# Patient Record
Sex: Male | Born: 1935 | Race: White | Hispanic: No | Marital: Married | Smoking: Never smoker
Health system: Southern US, Community
[De-identification: ages and names within clinical notes are randomized; demographics above are authoritative.]

## PROBLEM LIST (undated history)

## (undated) DIAGNOSIS — I209 Angina pectoris, unspecified: Secondary | ICD-10-CM

## (undated) DIAGNOSIS — K219 Gastro-esophageal reflux disease without esophagitis: Secondary | ICD-10-CM

## (undated) DIAGNOSIS — R569 Unspecified convulsions: Secondary | ICD-10-CM

## (undated) DIAGNOSIS — F0390 Unspecified dementia without behavioral disturbance: Secondary | ICD-10-CM

## (undated) DIAGNOSIS — IMO0002 Reserved for concepts with insufficient information to code with codable children: Secondary | ICD-10-CM

## (undated) DIAGNOSIS — I219 Acute myocardial infarction, unspecified: Secondary | ICD-10-CM

## (undated) DIAGNOSIS — I509 Heart failure, unspecified: Secondary | ICD-10-CM

## (undated) DIAGNOSIS — G309 Alzheimer's disease, unspecified: Secondary | ICD-10-CM

## (undated) DIAGNOSIS — I639 Cerebral infarction, unspecified: Secondary | ICD-10-CM

## (undated) DIAGNOSIS — K5792 Diverticulitis of intestine, part unspecified, without perforation or abscess without bleeding: Secondary | ICD-10-CM

## (undated) DIAGNOSIS — I10 Essential (primary) hypertension: Secondary | ICD-10-CM

## (undated) DIAGNOSIS — I4891 Unspecified atrial fibrillation: Secondary | ICD-10-CM

## (undated) DIAGNOSIS — I480 Paroxysmal atrial fibrillation: Secondary | ICD-10-CM

## (undated) DIAGNOSIS — F0280 Dementia in other diseases classified elsewhere without behavioral disturbance: Secondary | ICD-10-CM

## (undated) DIAGNOSIS — M1990 Unspecified osteoarthritis, unspecified site: Secondary | ICD-10-CM

## (undated) DIAGNOSIS — R4702 Dysphasia: Secondary | ICD-10-CM

## (undated) HISTORY — PX: JOINT REPLACEMENT: SHX530

## (undated) HISTORY — PX: TOTAL KNEE ARTHROPLASTY: SHX125

## (undated) HISTORY — PX: TOTAL HIP ARTHROPLASTY: SHX124

## (undated) HISTORY — PX: KNEE ARTHROSCOPY: SHX127

## (undated) HISTORY — PX: COLON SURGERY: SHX602

---

## 1978-09-21 HISTORY — PX: COLOSTOMY TAKEDOWN: SHX5258

## 1978-09-21 HISTORY — PX: COLON RESECTION: SHX5231

## 1997-05-29 ENCOUNTER — Other Ambulatory Visit: Admission: RE | Admit: 1997-05-29 | Discharge: 1997-05-29 | Payer: Self-pay | Admitting: Gastroenterology

## 1997-06-21 ENCOUNTER — Other Ambulatory Visit: Admission: RE | Admit: 1997-06-21 | Discharge: 1997-06-21 | Payer: Self-pay | Admitting: Gastroenterology

## 1997-09-12 ENCOUNTER — Encounter: Payer: Self-pay | Admitting: Cardiology

## 1997-09-12 ENCOUNTER — Ambulatory Visit (HOSPITAL_COMMUNITY): Admission: RE | Admit: 1997-09-12 | Discharge: 1997-09-12 | Payer: Self-pay | Admitting: Cardiology

## 2000-10-26 ENCOUNTER — Emergency Department (HOSPITAL_COMMUNITY): Admission: EM | Admit: 2000-10-26 | Discharge: 2000-10-27 | Payer: Self-pay | Admitting: *Deleted

## 2002-03-11 ENCOUNTER — Inpatient Hospital Stay (HOSPITAL_COMMUNITY): Admission: EM | Admit: 2002-03-11 | Discharge: 2002-03-15 | Payer: Self-pay | Admitting: Emergency Medicine

## 2002-03-11 ENCOUNTER — Encounter: Payer: Self-pay | Admitting: Orthopedic Surgery

## 2002-03-11 ENCOUNTER — Encounter: Payer: Self-pay | Admitting: Emergency Medicine

## 2002-03-15 ENCOUNTER — Encounter: Payer: Self-pay | Admitting: Physical Medicine & Rehabilitation

## 2002-03-15 ENCOUNTER — Inpatient Hospital Stay (HOSPITAL_COMMUNITY)
Admission: AD | Admit: 2002-03-15 | Discharge: 2002-03-25 | Payer: Self-pay | Admitting: Physical Medicine & Rehabilitation

## 2002-03-17 ENCOUNTER — Encounter: Payer: Self-pay | Admitting: Physical Medicine & Rehabilitation

## 2002-03-23 ENCOUNTER — Encounter: Payer: Self-pay | Admitting: Physical Medicine & Rehabilitation

## 2004-03-27 ENCOUNTER — Ambulatory Visit (HOSPITAL_COMMUNITY): Admission: RE | Admit: 2004-03-27 | Discharge: 2004-03-27 | Payer: Self-pay | Admitting: Gastroenterology

## 2004-03-27 ENCOUNTER — Encounter (INDEPENDENT_AMBULATORY_CARE_PROVIDER_SITE_OTHER): Payer: Self-pay | Admitting: Specialist

## 2005-12-01 ENCOUNTER — Encounter: Admission: RE | Admit: 2005-12-01 | Discharge: 2005-12-01 | Payer: Self-pay | Admitting: Neurology

## 2007-01-21 DIAGNOSIS — I219 Acute myocardial infarction, unspecified: Secondary | ICD-10-CM

## 2007-01-21 HISTORY — DX: Acute myocardial infarction, unspecified: I21.9

## 2007-01-21 HISTORY — PX: CORONARY ARTERY BYPASS GRAFT: SHX141

## 2007-02-23 ENCOUNTER — Inpatient Hospital Stay (HOSPITAL_COMMUNITY): Admission: EM | Admit: 2007-02-23 | Discharge: 2007-03-04 | Payer: Self-pay | Admitting: Emergency Medicine

## 2007-02-24 ENCOUNTER — Encounter: Payer: Self-pay | Admitting: Surgery

## 2007-02-24 ENCOUNTER — Encounter (INDEPENDENT_AMBULATORY_CARE_PROVIDER_SITE_OTHER): Payer: Self-pay | Admitting: Cardiology

## 2007-02-24 ENCOUNTER — Ambulatory Visit: Payer: Self-pay | Admitting: Surgery

## 2007-02-26 ENCOUNTER — Encounter: Payer: Self-pay | Admitting: Surgery

## 2007-03-18 ENCOUNTER — Encounter: Admission: RE | Admit: 2007-03-18 | Discharge: 2007-03-18 | Payer: Self-pay | Admitting: Cardiology

## 2007-03-23 ENCOUNTER — Ambulatory Visit: Payer: Self-pay | Admitting: Surgery

## 2007-03-23 ENCOUNTER — Encounter: Admission: RE | Admit: 2007-03-23 | Discharge: 2007-03-23 | Payer: Self-pay | Admitting: Surgery

## 2007-03-25 ENCOUNTER — Encounter (HOSPITAL_COMMUNITY): Admission: RE | Admit: 2007-03-25 | Discharge: 2007-06-23 | Payer: Self-pay | Admitting: Cardiology

## 2007-05-18 ENCOUNTER — Encounter: Admission: RE | Admit: 2007-05-18 | Discharge: 2007-05-18 | Payer: Self-pay | Admitting: Internal Medicine

## 2007-06-24 ENCOUNTER — Encounter (HOSPITAL_COMMUNITY): Admission: RE | Admit: 2007-06-24 | Discharge: 2007-07-21 | Payer: Self-pay | Admitting: Cardiology

## 2007-11-09 ENCOUNTER — Encounter: Admission: RE | Admit: 2007-11-09 | Discharge: 2007-11-09 | Payer: Self-pay | Admitting: Internal Medicine

## 2007-12-14 ENCOUNTER — Encounter: Admission: RE | Admit: 2007-12-14 | Discharge: 2008-01-12 | Payer: Self-pay | Admitting: Internal Medicine

## 2009-01-20 DIAGNOSIS — I639 Cerebral infarction, unspecified: Secondary | ICD-10-CM

## 2009-01-20 HISTORY — DX: Cerebral infarction, unspecified: I63.9

## 2009-02-05 ENCOUNTER — Inpatient Hospital Stay (HOSPITAL_COMMUNITY): Admission: AD | Admit: 2009-02-05 | Discharge: 2009-02-09 | Payer: Self-pay | Admitting: Internal Medicine

## 2009-02-05 ENCOUNTER — Ambulatory Visit: Payer: Self-pay | Admitting: Diagnostic Radiology

## 2009-02-05 ENCOUNTER — Encounter: Payer: Self-pay | Admitting: Emergency Medicine

## 2009-02-06 ENCOUNTER — Ambulatory Visit: Payer: Self-pay | Admitting: Vascular Surgery

## 2009-02-06 ENCOUNTER — Encounter (INDEPENDENT_AMBULATORY_CARE_PROVIDER_SITE_OTHER): Payer: Self-pay | Admitting: Internal Medicine

## 2009-04-27 ENCOUNTER — Inpatient Hospital Stay (HOSPITAL_COMMUNITY): Admission: EM | Admit: 2009-04-27 | Discharge: 2009-05-10 | Payer: Self-pay | Admitting: Emergency Medicine

## 2009-04-28 ENCOUNTER — Encounter (INDEPENDENT_AMBULATORY_CARE_PROVIDER_SITE_OTHER): Payer: Self-pay | Admitting: Internal Medicine

## 2009-04-30 ENCOUNTER — Encounter (INDEPENDENT_AMBULATORY_CARE_PROVIDER_SITE_OTHER): Payer: Self-pay | Admitting: Internal Medicine

## 2009-05-06 ENCOUNTER — Ambulatory Visit: Payer: Self-pay | Admitting: Vascular Surgery

## 2009-05-06 ENCOUNTER — Encounter (INDEPENDENT_AMBULATORY_CARE_PROVIDER_SITE_OTHER): Payer: Self-pay | Admitting: Internal Medicine

## 2009-09-24 IMAGING — CR DG CHEST 2V
2 series · 2 of 2 positions shown · non-contrast
Comparison: Single view chest of 02/24/07.

CLINICAL DATA: CHF.
 CHEST ? 2 VIEW:

[w chest pa *]
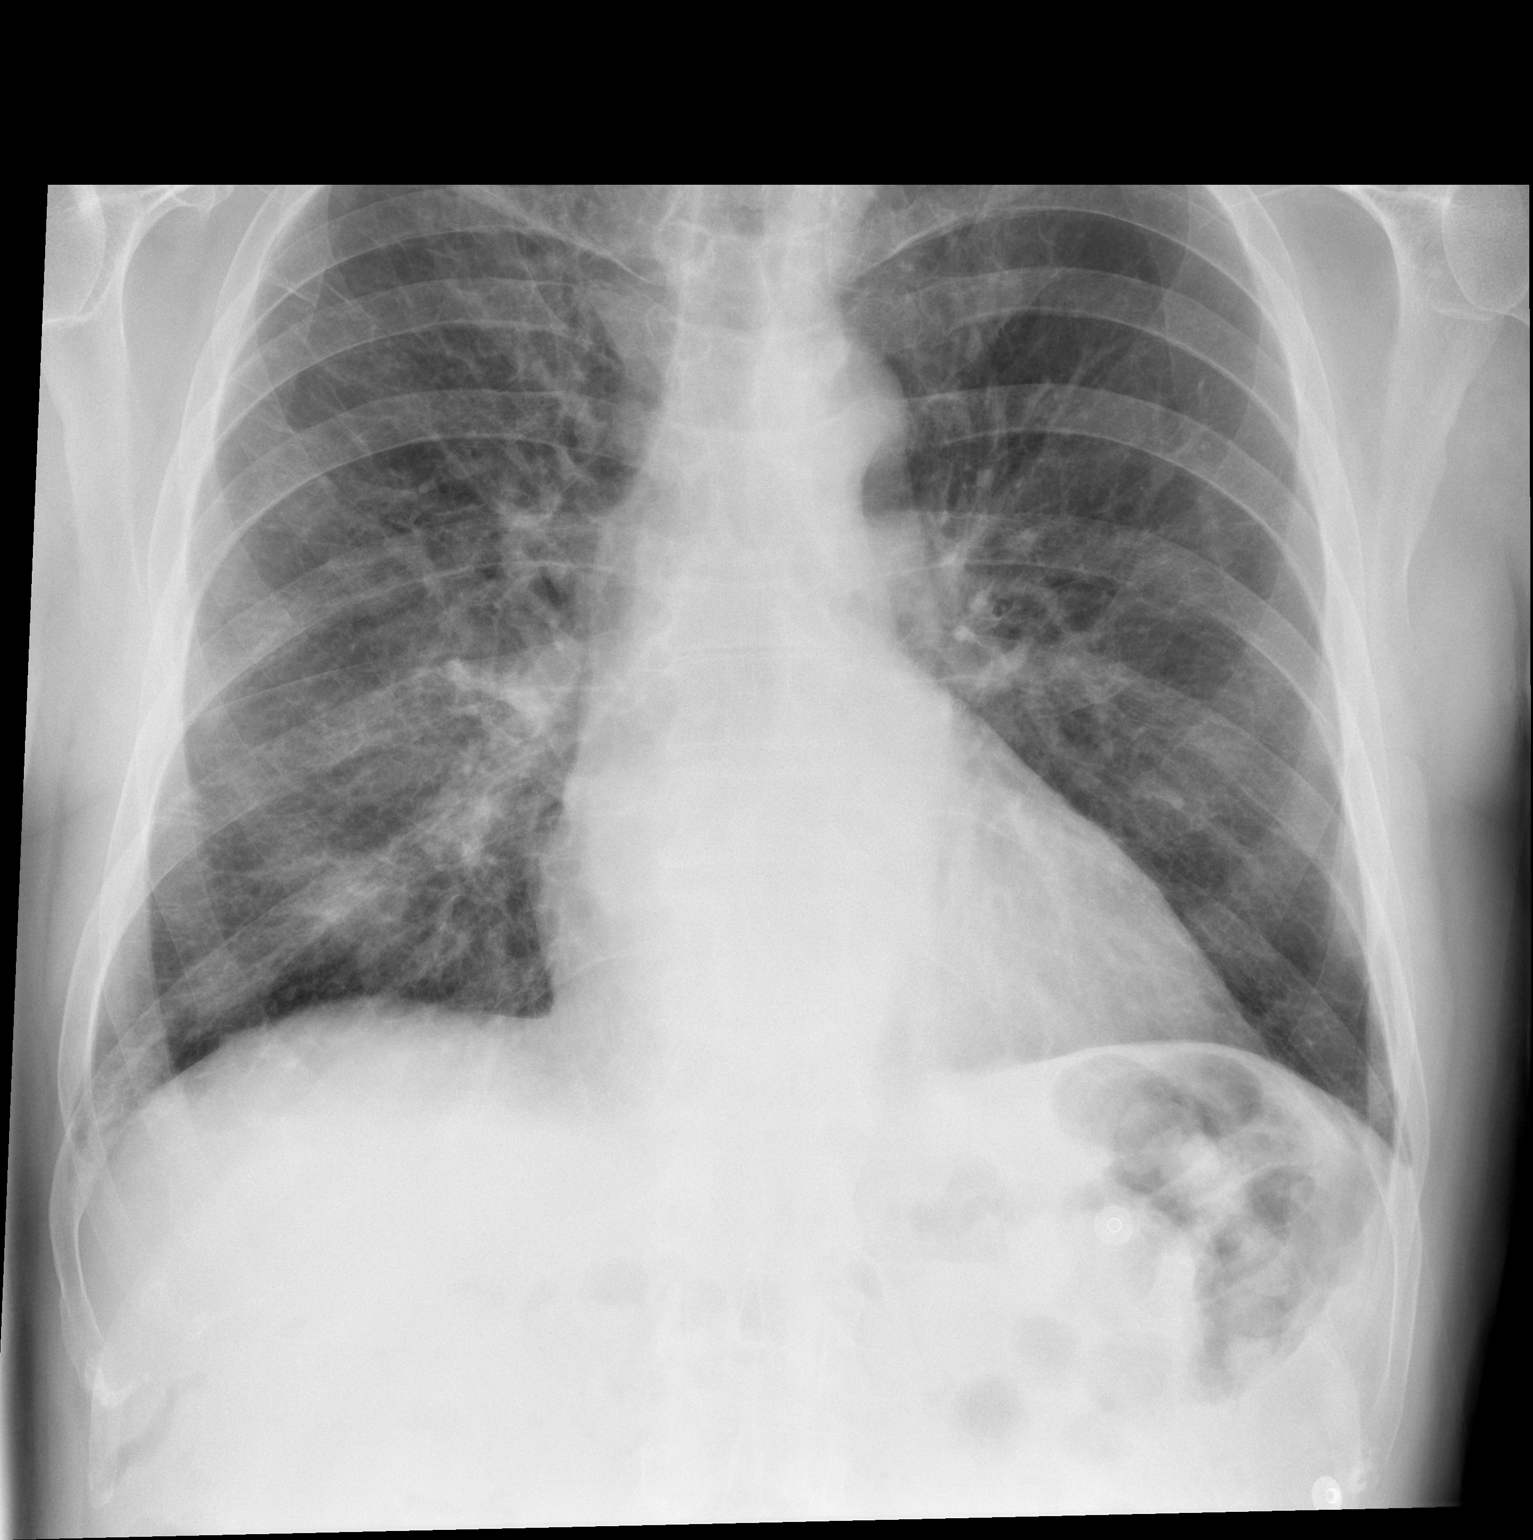

[w chest lat]
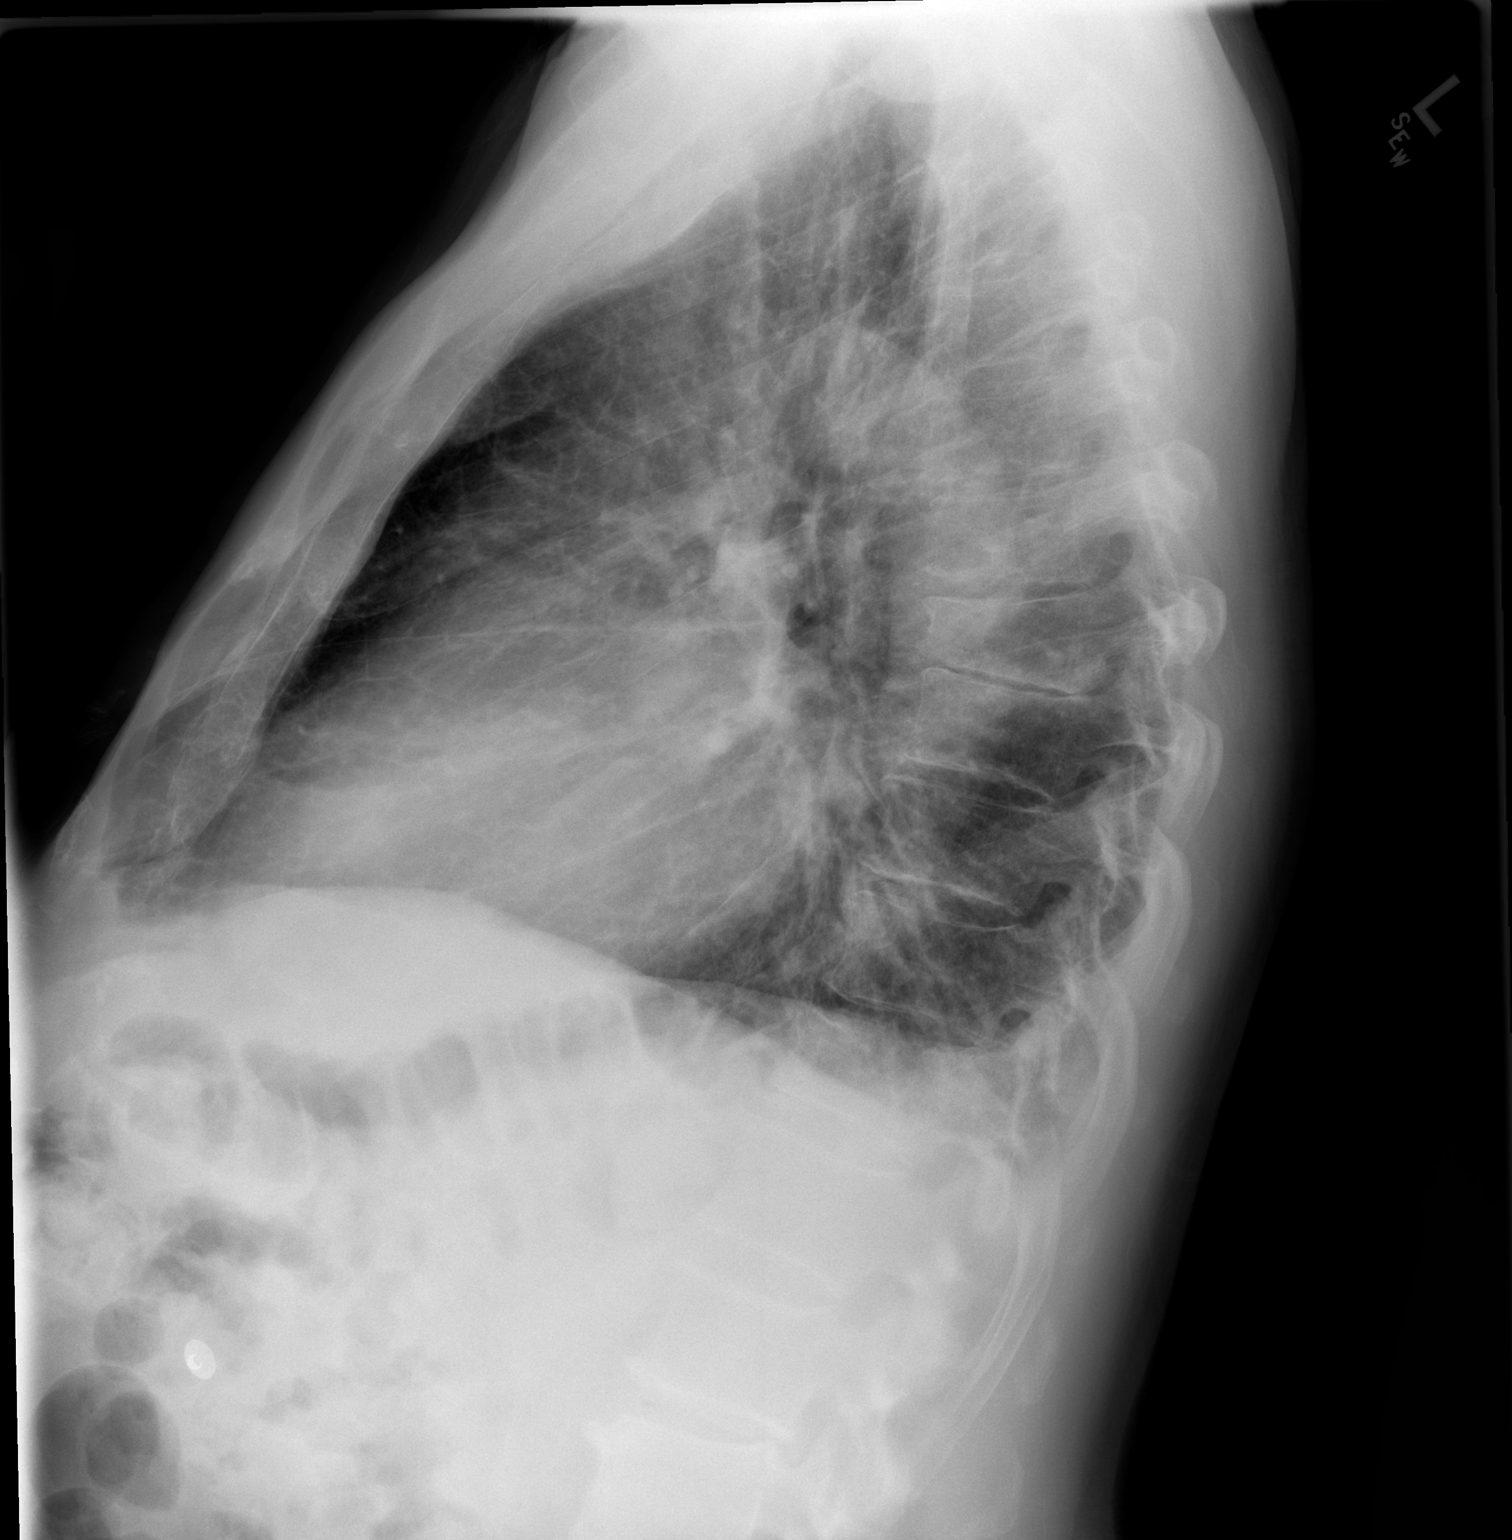

[2 of 2 positions shown; findings below may reference images not displayed]

FINDINGS: Stable mildly enlarged cardiac silhouette.  There are small bilateral pleural effusions.  Bilateral lower lobe airspace disease is not significantly changed from prior but improved from two days prior.  Mild interstitial edema is noted.  There is an age indeterminate compression fracture in the T-8 vertebral body.
IMPRESSION: 1.  Stable bilateral airspace disease. 
 2.  Slight increase in interstitial edema. 
 3.  Bilateral pleural effusions.

## 2009-09-26 IMAGING — CR DG CHEST 1V PORT
1 series · 1 of 1 positions shown · non-contrast
Comparison: 02/26/2007

CLINICAL DATA: Atelectasis. Status post CABG. 
 PORTABLE CHEST 02/27/2007:

[view not recorded]
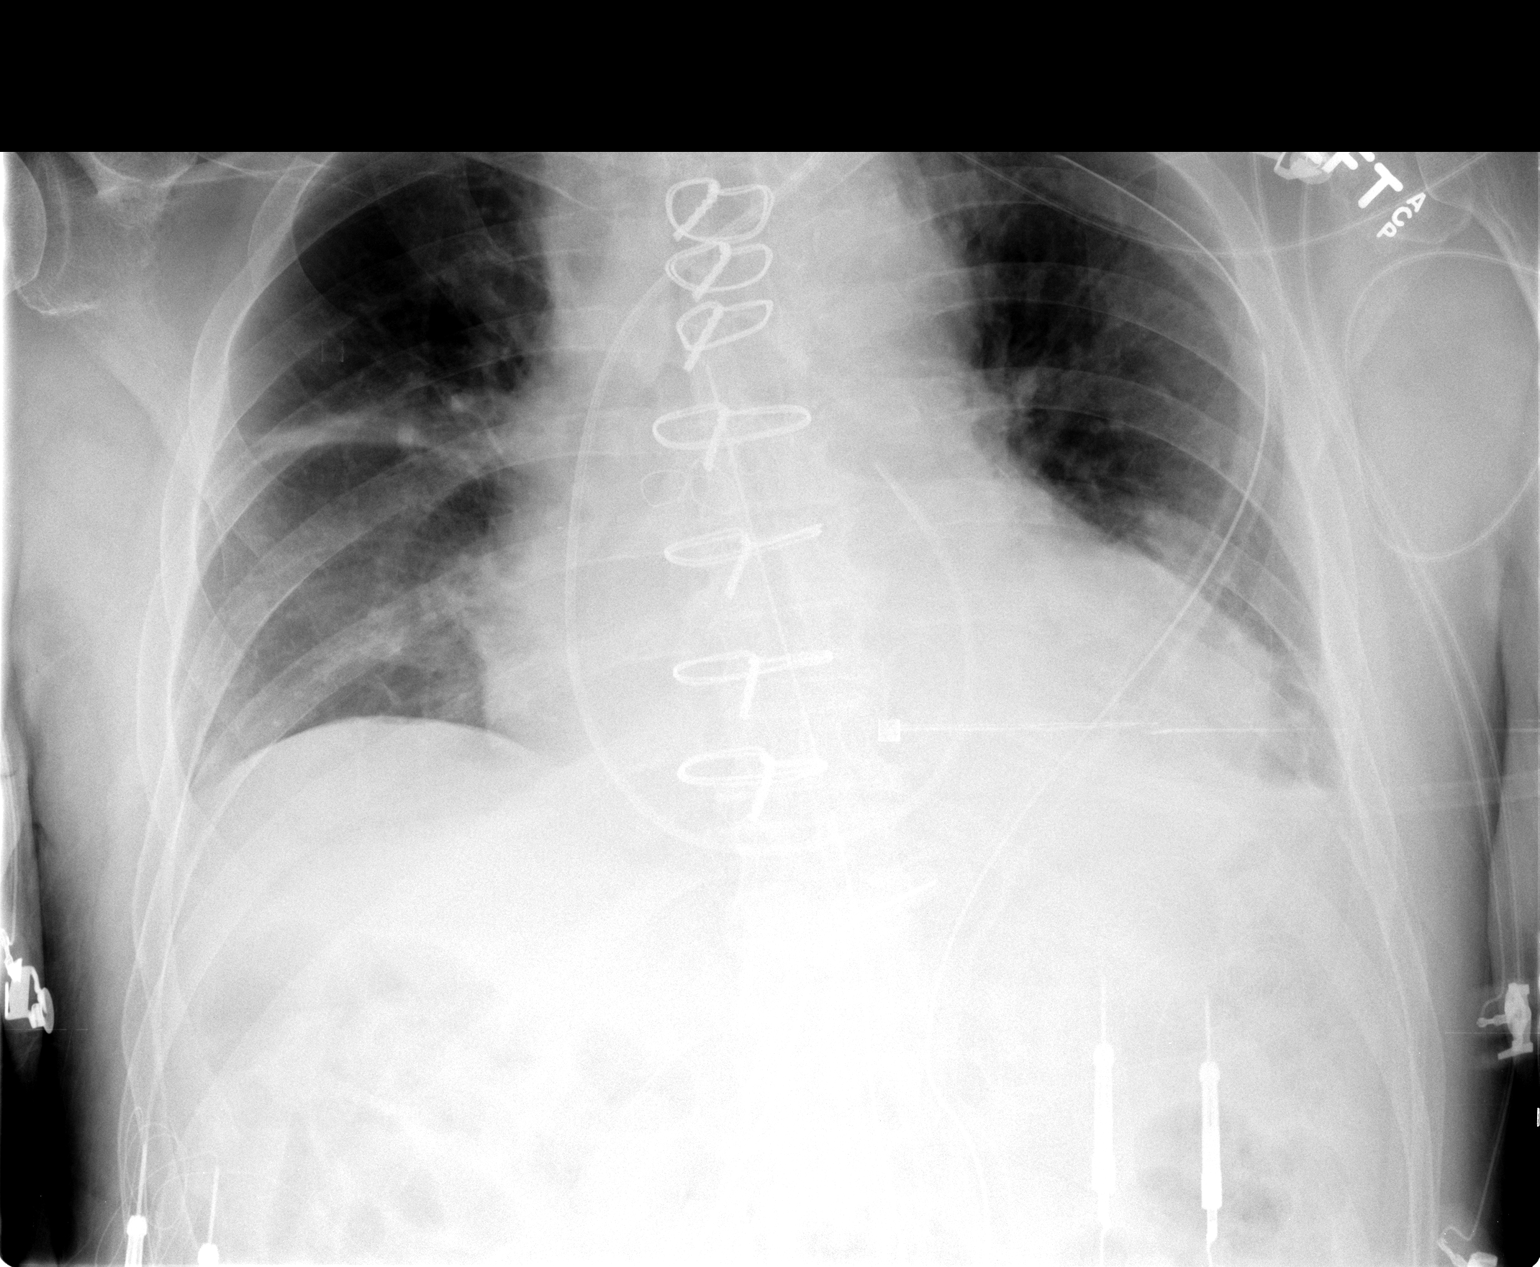

[1 of 1 positions shown; findings below may reference images not displayed]

FINDINGS: Endotracheal tube has been removed.  Swan-Ganz catheter has been repositioned and the tip is now in the main pulmonary artery. Left-sided chest tube and mediastinal tubes remain.  No pneumothorax.  The atelectasis has improved bilaterally.
IMPRESSION: Improving atelectasis.

## 2010-02-13 ENCOUNTER — Other Ambulatory Visit: Payer: Self-pay | Admitting: Dermatology

## 2010-04-07 LAB — CARDIAC PANEL(CRET KIN+CKTOT+MB+TROPI)
CK, MB: 1.8 ng/mL (ref 0.3–4.0)
CK, MB: 3 ng/mL (ref 0.3–4.0)
Relative Index: 0.9 (ref 0.0–2.5)
Relative Index: 1.5 (ref 0.0–2.5)
Troponin I: 0.05 ng/mL (ref 0.00–0.06)

## 2010-04-07 LAB — CBC
Hemoglobin: 12 g/dL — ABNORMAL LOW (ref 13.0–17.0)
MCHC: 33.6 g/dL (ref 30.0–36.0)
Platelets: 205 10*3/uL (ref 150–400)
RBC: 3.72 MIL/uL — ABNORMAL LOW (ref 4.22–5.81)
RBC: 3.84 MIL/uL — ABNORMAL LOW (ref 4.22–5.81)
WBC: 13.6 10*3/uL — ABNORMAL HIGH (ref 4.0–10.5)
WBC: 32.5 10*3/uL — ABNORMAL HIGH (ref 4.0–10.5)

## 2010-04-07 LAB — DIFFERENTIAL
Basophils Relative: 0 % (ref 0–1)
Eosinophils Relative: 0 % (ref 0–5)
Lymphocytes Relative: 4 % — ABNORMAL LOW (ref 12–46)
Monocytes Relative: 3 % (ref 3–12)
Neutro Abs: 30.2 10*3/uL — ABNORMAL HIGH (ref 1.7–7.7)

## 2010-04-07 LAB — VITAMIN B12: Vitamin B-12: 561 pg/mL (ref 211–911)

## 2010-04-07 LAB — BASIC METABOLIC PANEL
CO2: 24 mEq/L (ref 19–32)
Calcium: 7.6 mg/dL — ABNORMAL LOW (ref 8.4–10.5)
Glucose, Bld: 121 mg/dL — ABNORMAL HIGH (ref 70–99)
Sodium: 134 mEq/L — ABNORMAL LOW (ref 135–145)

## 2010-04-07 LAB — MAGNESIUM: Magnesium: 1.6 mg/dL (ref 1.5–2.5)

## 2010-04-07 LAB — RPR: RPR Ser Ql: NONREACTIVE

## 2010-04-07 LAB — PROTIME-INR
INR: 2.69 — ABNORMAL HIGH (ref 0.00–1.49)
INR: 2.83 — ABNORMAL HIGH (ref 0.00–1.49)
Prothrombin Time: 25 seconds — ABNORMAL HIGH (ref 11.6–15.2)
Prothrombin Time: 28.4 seconds — ABNORMAL HIGH (ref 11.6–15.2)

## 2010-04-07 LAB — CULTURE, BLOOD (ROUTINE X 2): Culture: NO GROWTH

## 2010-04-07 LAB — LIPID PANEL
HDL: 40 mg/dL (ref >39)
Total CHOL/HDL Ratio: 2.9 RATIO
Triglycerides: 75 mg/dL (ref <150)
VLDL: 15 mg/dL (ref 0–40)

## 2010-04-08 LAB — URINE MICROSCOPIC-ADD ON

## 2010-04-08 LAB — COMPREHENSIVE METABOLIC PANEL
ALT: 22 U/L (ref 0–53)
Alkaline Phosphatase: 97 U/L (ref 39–117)
BUN: 18 mg/dL (ref 6–23)
CO2: 29 mEq/L (ref 19–32)
Chloride: 103 mEq/L (ref 96–112)
GFR calc non Af Amer: >60 mL/min (ref >60)
Glucose, Bld: 119 mg/dL — ABNORMAL HIGH (ref 70–99)
Potassium: 4.2 mEq/L (ref 3.5–5.1)
Sodium: 140 mEq/L (ref 135–145)
Total Bilirubin: 1.2 mg/dL (ref 0.3–1.2)
Total Protein: 6.6 g/dL (ref 6.0–8.3)

## 2010-04-08 LAB — URINALYSIS, ROUTINE W REFLEX MICROSCOPIC
Glucose, UA: NEGATIVE mg/dL
Ketones, ur: NEGATIVE mg/dL
Protein, ur: >300 mg/dL — AB

## 2010-04-08 LAB — DIFFERENTIAL
Basophils Absolute: 0 10*3/uL (ref 0.0–0.1)
Basophils Relative: 0 % (ref 0–1)
Eosinophils Absolute: 0.3 10*3/uL (ref 0.0–0.7)
Lymphocytes Relative: 11 % — ABNORMAL LOW (ref 12–46)
Lymphs Abs: 3.3 10*3/uL (ref 0.7–4.0)
Monocytes Absolute: 1.5 10*3/uL — ABNORMAL HIGH (ref 0.1–1.0)
Neutrophils Relative %: 78 % — ABNORMAL HIGH (ref 43–77)

## 2010-04-08 LAB — CBC
HCT: 39.5 % (ref 39.0–52.0)
Hemoglobin: 13.5 g/dL (ref 13.0–17.0)
RBC: 4.27 MIL/uL (ref 4.22–5.81)
RDW: 12.5 % (ref 11.5–15.5)

## 2010-04-08 LAB — PROTIME-INR
INR: 3 — ABNORMAL HIGH (ref 0.00–1.49)
Prothrombin Time: 30.9 seconds — ABNORMAL HIGH (ref 11.6–15.2)

## 2010-04-08 LAB — CULTURE, BLOOD (ROUTINE X 2): Culture: NO GROWTH

## 2010-04-09 LAB — BASIC METABOLIC PANEL
BUN: 15 mg/dL (ref 6–23)
BUN: 16 mg/dL (ref 6–23)
BUN: 17 mg/dL (ref 6–23)
CO2: 23 mEq/L (ref 19–32)
CO2: 25 mEq/L (ref 19–32)
CO2: 28 mEq/L (ref 19–32)
Calcium: 7.8 mg/dL — ABNORMAL LOW (ref 8.4–10.5)
Calcium: 8.1 mg/dL — ABNORMAL LOW (ref 8.4–10.5)
Calcium: 8.3 mg/dL — ABNORMAL LOW (ref 8.4–10.5)
Chloride: 109 mEq/L (ref 96–112)
Chloride: 109 mEq/L (ref 96–112)
Chloride: 109 mEq/L (ref 96–112)
Creatinine, Ser: 0.78 mg/dL (ref 0.4–1.5)
Creatinine, Ser: 0.86 mg/dL (ref 0.4–1.5)
Creatinine, Ser: 0.92 mg/dL (ref 0.4–1.5)
GFR calc Af Amer: >60 mL/min (ref >60)
GFR calc Af Amer: >60 mL/min (ref >60)
GFR calc Af Amer: >60 mL/min (ref >60)
GFR calc non Af Amer: >60 mL/min (ref >60)
GFR calc non Af Amer: >60 mL/min (ref >60)
GFR calc non Af Amer: >60 mL/min (ref >60)
Glucose, Bld: 102 mg/dL — ABNORMAL HIGH (ref 70–99)
Glucose, Bld: 103 mg/dL — ABNORMAL HIGH (ref 70–99)
Glucose, Bld: 96 mg/dL (ref 70–99)
Potassium: 3.1 mEq/L — ABNORMAL LOW (ref 3.5–5.1)
Potassium: 3.8 mEq/L (ref 3.5–5.1)
Potassium: 3.9 mEq/L (ref 3.5–5.1)
Sodium: 140 mEq/L (ref 135–145)
Sodium: 143 mEq/L (ref 135–145)
Sodium: 144 mEq/L (ref 135–145)

## 2010-04-09 LAB — CARDIAC PANEL(CRET KIN+CKTOT+MB+TROPI)
CK, MB: 1.3 ng/mL (ref 0.3–4.0)
CK, MB: 1.8 ng/mL (ref 0.3–4.0)
Relative Index: INVALID (ref 0.0–2.5)
Total CK: 24 U/L (ref 7–232)
Total CK: 32 U/L (ref 7–232)
Troponin I: 0.02 ng/mL (ref 0.00–0.06)

## 2010-04-09 LAB — CBC
HCT: 37.5 % — ABNORMAL LOW (ref 39.0–52.0)
Hemoglobin: 12.7 g/dL — ABNORMAL LOW (ref 13.0–17.0)
MCHC: 34 g/dL (ref 30.0–36.0)
MCV: 93.3 fL (ref 78.0–100.0)
Platelets: 313 10*3/uL (ref 150–400)
RBC: 4.02 MIL/uL — ABNORMAL LOW (ref 4.22–5.81)
RDW: 13.4 % (ref 11.5–15.5)
WBC: 11 10*3/uL — ABNORMAL HIGH (ref 4.0–10.5)

## 2010-04-09 LAB — PROTIME-INR
INR: 2.55 — ABNORMAL HIGH (ref 0.00–1.49)
INR: 2.62 — ABNORMAL HIGH (ref 0.00–1.49)
INR: 2.62 — ABNORMAL HIGH (ref 0.00–1.49)
INR: 2.75 — ABNORMAL HIGH (ref 0.00–1.49)
INR: 2.78 — ABNORMAL HIGH (ref 0.00–1.49)
INR: 2.9 — ABNORMAL HIGH (ref 0.00–1.49)
Prothrombin Time: 27.2 seconds — ABNORMAL HIGH (ref 11.6–15.2)
Prothrombin Time: 27.8 seconds — ABNORMAL HIGH (ref 11.6–15.2)
Prothrombin Time: 27.8 seconds — ABNORMAL HIGH (ref 11.6–15.2)
Prothrombin Time: 28.9 seconds — ABNORMAL HIGH (ref 11.6–15.2)
Prothrombin Time: 29.1 seconds — ABNORMAL HIGH (ref 11.6–15.2)
Prothrombin Time: 30.1 seconds — ABNORMAL HIGH (ref 11.6–15.2)

## 2010-04-09 LAB — GLUCOSE, CAPILLARY: Glucose-Capillary: 131 mg/dL — ABNORMAL HIGH (ref 70–99)

## 2010-04-09 LAB — MRSA PCR SCREENING: MRSA by PCR: NEGATIVE

## 2010-04-10 LAB — CARDIAC PANEL(CRET KIN+CKTOT+MB+TROPI)
CK, MB: 0.8 ng/mL (ref 0.3–4.0)
CK, MB: 1.2 ng/mL (ref 0.3–4.0)
CK, MB: 5.4 ng/mL — ABNORMAL HIGH (ref 0.3–4.0)
Relative Index: 1.5 (ref 0.0–2.5)
Relative Index: 2 (ref 0.0–2.5)
Relative Index: INVALID (ref 0.0–2.5)
Relative Index: INVALID (ref 0.0–2.5)
Total CK: 55 U/L (ref 7–232)
Total CK: 74 U/L (ref 7–232)
Troponin I: 1.05 ng/mL (ref 0.00–0.06)
Troponin I: 1.2 ng/mL (ref 0.00–0.06)
Troponin I: 2.09 ng/mL (ref 0.00–0.06)
Troponin I: 2.49 ng/mL (ref 0.00–0.06)

## 2010-04-10 LAB — BASIC METABOLIC PANEL
BUN: 13 mg/dL (ref 6–23)
BUN: 17 mg/dL (ref 6–23)
BUN: 17 mg/dL (ref 6–23)
CO2: 26 mEq/L (ref 19–32)
CO2: 27 mEq/L (ref 19–32)
CO2: 29 mEq/L (ref 19–32)
Calcium: 8 mg/dL — ABNORMAL LOW (ref 8.4–10.5)
Calcium: 8.2 mg/dL — ABNORMAL LOW (ref 8.4–10.5)
Calcium: 8.4 mg/dL (ref 8.4–10.5)
Chloride: 104 mEq/L (ref 96–112)
Chloride: 107 mEq/L (ref 96–112)
Chloride: 108 mEq/L (ref 96–112)
Creatinine, Ser: 0.88 mg/dL (ref 0.4–1.5)
Creatinine, Ser: 0.94 mg/dL (ref 0.4–1.5)
Creatinine, Ser: 0.98 mg/dL (ref 0.4–1.5)
GFR calc Af Amer: >60 mL/min (ref >60)
GFR calc Af Amer: >60 mL/min (ref >60)
GFR calc Af Amer: >60 mL/min (ref >60)
GFR calc non Af Amer: >60 mL/min (ref >60)
GFR calc non Af Amer: >60 mL/min (ref >60)
GFR calc non Af Amer: >60 mL/min (ref >60)
Glucose, Bld: 105 mg/dL — ABNORMAL HIGH (ref 70–99)
Glucose, Bld: 109 mg/dL — ABNORMAL HIGH (ref 70–99)
Glucose, Bld: 111 mg/dL — ABNORMAL HIGH (ref 70–99)
Potassium: 3.3 mEq/L — ABNORMAL LOW (ref 3.5–5.1)
Potassium: 3.4 mEq/L — ABNORMAL LOW (ref 3.5–5.1)
Potassium: 3.5 mEq/L (ref 3.5–5.1)
Sodium: 138 mEq/L (ref 135–145)
Sodium: 142 mEq/L (ref 135–145)
Sodium: 143 mEq/L (ref 135–145)

## 2010-04-10 LAB — POCT I-STAT, CHEM 8
Calcium, Ion: 1.06 mmol/L — ABNORMAL LOW (ref 1.12–1.32)
Creatinine, Ser: 0.9 mg/dL (ref 0.4–1.5)
Glucose, Bld: 129 mg/dL — ABNORMAL HIGH (ref 70–99)
Hemoglobin: 12.6 g/dL — ABNORMAL LOW (ref 13.0–17.0)
Sodium: 143 mEq/L (ref 135–145)
TCO2: 25 mmol/L (ref 0–100)

## 2010-04-10 LAB — DIFFERENTIAL
Eosinophils Absolute: 0 10*3/uL (ref 0.0–0.7)
Lymphs Abs: 0.9 10*3/uL (ref 0.7–4.0)
Neutrophils Relative %: 87 % — ABNORMAL HIGH (ref 43–77)

## 2010-04-10 LAB — CBC
HCT: 35.5 % — ABNORMAL LOW (ref 39.0–52.0)
HCT: 35.8 % — ABNORMAL LOW (ref 39.0–52.0)
Hemoglobin: 12 g/dL — ABNORMAL LOW (ref 13.0–17.0)
Hemoglobin: 12 g/dL — ABNORMAL LOW (ref 13.0–17.0)
MCHC: 33.8 g/dL (ref 30.0–36.0)
MCV: 93.4 fL (ref 78.0–100.0)
MCV: 94.4 fL (ref 78.0–100.0)
MCV: 94.9 fL (ref 78.0–100.0)
Platelets: 184 10*3/uL (ref 150–400)
Platelets: 240 10*3/uL (ref 150–400)
RBC: 3.76 MIL/uL — ABNORMAL LOW (ref 4.22–5.81)
RBC: 3.77 MIL/uL — ABNORMAL LOW (ref 4.22–5.81)
RDW: 13.6 % (ref 11.5–15.5)
WBC: 11.9 10*3/uL — ABNORMAL HIGH (ref 4.0–10.5)
WBC: 13.1 10*3/uL — ABNORMAL HIGH (ref 4.0–10.5)
WBC: 14.7 10*3/uL — ABNORMAL HIGH (ref 4.0–10.5)

## 2010-04-10 LAB — PROTIME-INR
INR: 1.2 (ref 0.00–1.49)
INR: 1.31 (ref 0.00–1.49)
INR: 1.69 — ABNORMAL HIGH (ref 0.00–1.49)
INR: 2.04 — ABNORMAL HIGH (ref 0.00–1.49)
INR: 2.49 — ABNORMAL HIGH (ref 0.00–1.49)
Prothrombin Time: 15.1 seconds (ref 11.6–15.2)
Prothrombin Time: 16.2 seconds — ABNORMAL HIGH (ref 11.6–15.2)
Prothrombin Time: 19.7 seconds — ABNORMAL HIGH (ref 11.6–15.2)
Prothrombin Time: 22.9 seconds — ABNORMAL HIGH (ref 11.6–15.2)
Prothrombin Time: 26.7 seconds — ABNORMAL HIGH (ref 11.6–15.2)

## 2010-04-10 LAB — COMPREHENSIVE METABOLIC PANEL
ALT: 17 U/L (ref 0–53)
Albumin: 2.3 g/dL — ABNORMAL LOW (ref 3.5–5.2)
Alkaline Phosphatase: 59 U/L (ref 39–117)
BUN: 10 mg/dL (ref 6–23)
CO2: 29 mEq/L (ref 19–32)
Calcium: 8.3 mg/dL — ABNORMAL LOW (ref 8.4–10.5)
Chloride: 107 mEq/L (ref 96–112)
Creatinine, Ser: 0.95 mg/dL (ref 0.4–1.5)
Creatinine, Ser: 0.99 mg/dL (ref 0.4–1.5)
GFR calc Af Amer: >60 mL/min (ref >60)
GFR calc non Af Amer: >60 mL/min (ref >60)
Glucose, Bld: 149 mg/dL — ABNORMAL HIGH (ref 70–99)
Glucose, Bld: 150 mg/dL — ABNORMAL HIGH (ref 70–99)
Potassium: 3.1 mEq/L — ABNORMAL LOW (ref 3.5–5.1)
Sodium: 142 mEq/L (ref 135–145)
Total Bilirubin: 1.3 mg/dL — ABNORMAL HIGH (ref 0.3–1.2)
Total Protein: 5.8 g/dL — ABNORMAL LOW (ref 6.0–8.3)

## 2010-04-10 LAB — LACTIC ACID, PLASMA: Lactic Acid, Venous: 1.5 mmol/L (ref 0.5–2.2)

## 2010-04-10 LAB — URINE MICROSCOPIC-ADD ON

## 2010-04-10 LAB — LIPID PANEL
Triglycerides: 86 mg/dL (ref <150)
VLDL: 17 mg/dL (ref 0–40)

## 2010-04-10 LAB — URINALYSIS, ROUTINE W REFLEX MICROSCOPIC
Glucose, UA: NEGATIVE mg/dL
Specific Gravity, Urine: 1.021 (ref 1.005–1.030)
pH: 5.5 (ref 5.0–8.0)

## 2010-04-10 LAB — BRAIN NATRIURETIC PEPTIDE: Pro B Natriuretic peptide (BNP): 681 pg/mL — ABNORMAL HIGH (ref 0.0–100.0)

## 2010-04-10 LAB — POCT CARDIAC MARKERS: Myoglobin, poc: >500 ng/mL (ref 12–200)

## 2010-04-10 LAB — CULTURE, BLOOD (ROUTINE X 2)
Culture: NO GROWTH
Culture: NO GROWTH

## 2010-04-10 LAB — HEMOGLOBIN A1C
Hgb A1c MFr Bld: 5.5 % (ref 4.6–6.1)
Mean Plasma Glucose: 111 mg/dL

## 2010-06-04 NOTE — Op Note (Signed)
Trevor Webb, Trevor Webb            ACCOUNT NO.:  [id number]   MEDICAL RECORD NO.:  [id number]          PATIENT TYPE:  INP   LOCATION:  2307                         FACILITY:  [facility]   PHYSICIAN:  Evelene Croon, M.D.     DATE OF BIRTH:  01/08/1929   DATE OF PROCEDURE:  02/26/2007  DATE OF DISCHARGE:                               OPERATIVE REPORT   PREOPERATIVE DIAGNOSIS:  High-grade left main and three-vessel coronary  artery disease with severe left ventricular dysfunction and mitral  regurgitation.   POSTOPERATIVE DIAGNOSIS:  High-grade left main and three-vessel coronary  artery disease with severe left ventricular dysfunction and mitral  regurgitation.   OPERATIVE PROCEDURE:  Median sternotomy, extracorporeal circulation,  coronary artery bypass graft surgery x4 using a left internal mammary  artery graft to the left anterior descending coronary, with a saphenous  vein graft to the obtuse marginal branch of the left circumflex coronary  artery, a saphenous vein graft to the posterior descending branch of the  left circumflex coronary artery, and a saphenous vein graft to the right  coronary artery.  Endoscopic vein harvesting from the right leg.  Mitral  annuloplasty with a 28 mm Sorin annuloplasty ring.   SURGEON:  Evelene Croon, M.D.   ASSISTANT:  Coral Ceo, P.A.C.   ANESTHESIA:  General endotracheal.   CLINICAL HISTORY:  This is a 77 year old gentleman with history of  hyperlipidemia, hypertension, and chronic atrial fibrillation as well as  a history of insignificant coronary disease in the past who presented  with a 2-3 weeks history of nonexertional aching right shoulder and  upper arm pain.  This became more severe over the previous few days  before admission and he was admitted with pulmonary edema and pain and  ruled in for a non-ST segment elevation MI.  His CPK rose to 335 with an  MB of 35 and his troponin I increased to 11.49.  Cardiac catheterization  showed  an 80% mid left main stenosis.  There is 70% ostial left  circumflex.  There is a single large marginal branch.  The left  circumflex was a dominant vessel that gave off three distal branches,  one of which is a posterior descending branch.  The right coronary was a  smaller nondominant vessel that had about 75% proximal stenosis.  Ejection fraction is about 30-40% with 2+ mitral regurgitation at  catheterization.  After review of the catheterization and examination of  the patient, it was felt that coronary artery bypass graft surgery is  the best treatment to prevent further ischemia and infarction.  I told  the patient that we would evaluate his mitral valve in the operating  room with transesophageal echocardiogram to decide whether intervention  was warranted for that.  I could not hear a murmur on exam.  The patient  did have a couple of episodes of chest and right arm pain preoperatively  with significant EKG changes.  He remained on nitroglycerin and was  started on heparin.  He was taken to the operating room in stable  condition.  While I discussed the operative procedure with him including  alternatives, benefits, and risks including, but not limited to,  bleeding, blood transfusion, infection, stroke, myocardial infarction,  graft failure, and death.  He understood, agreed to proceed.   OPERATIVE PROCEDURE:  The patient was taken to the operating room and  placed on the table in supine position.  He was having chest pain  preoperatively.  He remained hemodynamically stable.  After induction of  general endotracheal anesthesia, a Foley catheter was placed in the  bladder using sterile technique.  Then the chest, abdomen and both lower  extremities were prepped and draped in usual sterile manner.  A  transesophageal echocardiogram was performed.  This showed severe left  ventricular dysfunction with ejection fraction of about 25-30%.  The  left ventricle was dilated and globally  hypokinetic.  There was varying  degrees of mitral regurgitation which may increase to 3 to 4+ at maximum  with pulmonary vein reversal flow.  This appeared to be central  regurgitation and I felt it was most likely related to ischemia and left  ventricular distention.  The structural valve otherwise appeared fairly  normal.  He did have pulmonary hypertension with PAD of about the 25-30  with this degree of mitral regurgitation.  I felt that this would be  best treated with mitral annuloplasty.   Then the chest was opened through a median sternotomy incision and the  pericardium opened in the midline.  Examination of the heart showed good  ventricular contractility.  The ascending aorta had no palpable plaques  in it.   Then the left internal mammary artery was harvested from the chest wall  as a pedicle graft.  This is a medium caliber vessel with excellent  blood flow through it.  At the same time, a segment of greater saphenous  vein was harvested from the right leg using endoscopic vein harvest  technique.  This vein was of medium size and good quality.   Then the patient was heparinized and when an adequate activated clotting  time was achieved, the distal ascending aorta was cannulated using a 20-  [place] aortic cannula for arterial inflow.  Venous outflow was achieved  using bicaval venous cannulation with a 24-French metal tipped right  angle cannula placed through a pursestring suture in the superior vena  cava and a 34-French plastic right-angle cannula placed through a  pursestring suture in the low right atrium.  An antegrade cardioplegia  and vent cannula was inserted in the aortic root.  Left the retrograde  cardioplegic cannula was inserted through the right atrium and coronary  sinus.   The patient was placed on cardiopulmonary bypass and distal coronaries  identified.  The LAD was a large graftable vessel.  The obtuse marginal  was a large graftable vessel  proximally and then became intramyocardial.  The right coronary artery was a small vessel.  This was located in its  proximal portion just beyond the area of stenosis where it was suitable  for grafting.  The distal left circumflex gave off three moderate-sized  branches.  The largest was the posterior descending and this was chosen  for grafting.  They all communicated on angiogram.   Then the aorta was crossclamped and 500 mL of cold blood antegrade  cardioplegia was administered in the aortic root with quick arrest of  the heart.  Systemic hypothermia to 28 degrees centigrade and topical  hypothermia with iced saline was used.  A temperature probe was placed  in septum and insulating pad in the pericardium.  The first distal anastomosis was performed to the obtuse marginal  branch.  The internal diameter of this vessel was about 2.5 mm.  The  conduit used was a segment of greater saphenous vein and anastomosis  performed in end-to-side manner using continuous 7-0 Prolene suture.  Flow was measured through the graft and was excellent.   The second distal anastomosis was performed to the distal left  circumflex or posterior descending artery.  The internal diameter of  this vessel was about 1.6 mm.  The conduit used was the second segment  of greater saphenous vein and the anastomosis performed end-to-side  manner using continuous 7-0 Prolene suture.  Flow was measured through  the graft and was excellent.   Then the third distal anastomosis was performed to the right coronary  artery.  The internal diameter was about 1.75 mm.  The conduit used was  a third segment of greater saphenous vein.  Anastomosis performed in end-  to-side manner using continuous 7-0 Prolene suture.  Flow was measured  through the graft and was excellent.   Then the fourth distal anastomosis was performed to the midportion of  the left anterior descending coronary artery.  The internal diameter of  this  vessel was about 2 mm.  The conduit used was a left internal  mammary graft and this was brought through an opening in the left  pericardium and anterior to the phrenic nerve.  It was anastomosed to  the LAD in end-to-side manner using continuous 8-0 Prolene suture.  The  pedicle was sutured to the epicardium with 6-0 Prolene sutures.  Then  another dose of cardioplegia was given into the aortic root.   Attention was turned to mitral valve annuloplasty.  The superior and  inferior vena cavae were mobilized.  The interatrial groove was  developed and the left atrium opened through a vertical incision in the  interatrial groove.  The mitral valve retractor was placed.  There was  excellent exposure of the mitral valve.  Examination of the valve showed  essentially normal structure.  The valve was tested with iced saline  solution and there was minimal central regurgitation with the heart  decompressed.  I felt that an annuloplasty ring would help prevent  transient ischemic mitral regurgitation or regurgitation due to left  ventricular dilatation postop.  Then a series of 2-0 Ethibond sutures  were placed around the mitral annulus.  The distance between the fibrous  trigones as well as the surface area of the anterior leaflet were  measured and a 28 mm Sorin MEMO 3-D annuloplasty ring was chosen.  This  had model number [id number], serial number [id number].  Then the sutures were  placed through the ring and the ring lowered in place.  The sutures was  tied sequentially.  The ring seated nicely.  The valve was again tested  with iced saline solution and was completely competent.  The left atrium  was closed in two layers using continuous 3-0 Prolene suture.  The  patient was rewarmed to 37 degrees centigrade.  While the patient was  rewarming, the three proximal vein graft anastomoses were performed to  the aortic root in end-to-side manner using continuous 6-0 Prolene  suture.  Then the left side  of the heart was de-aired.  The clamp was  removed from mammary pedicle.  There was rapid warming of the  ventricular septum and return of spontaneous ventricular fibrillation.  The head was placed in Trendelenburg position and the crossclamp  removed  with a time of 133 minutes.  There was spontaneous return of sinus  rhythm that was not conducted into the ventricle.  The proximal and  distal anastomoses appeared hemostatic and alignment of the grafts  satisfactory.  Graft markers were placed around the proximal  anastomoses.  Two temporary right ventricular and right atrial pacing  wires were placed and brought out through the skin.   When the patient had rewarmed to 37 degrees centigrade, he was weaned  from cardiopulmonary bypass on low-dose dopamine and milrinone.  Rhythm  returned to sinus.  Total bypass time was 193 minutes.  Cardiac function  appeared improved with a cardiac output of 6 liters a minute.  Then  protamine was given and venous and aortic cannula was removed without  difficulty.  Hemostasis was achieved.  The patient was given 10 units of  platelets due to coagulopathy and thrombocytopenia.  Then three chest  tubes were placed with a tube in the post pericardium, one in the left  pleural space and one in the anterior mediastinum.  Sternum was closed  with double #6 stainless steel wires.  The fascia was closed with  continuous #1 Vicryl suture.  Subcutaneous tissue was closed with  continuous 2-0 Vicryl and the skin with a 3-0 Vicryl subcuticular  closure.  Lower extremity vein harvest site was closed in layers in a  similar manner.  The sponge, needle and instrument counts were correct  according to the scrub nurse.  Dry sterile dressings were applied over  the incisions around the chest tubes which were hooked to Pleur-Evac  suction.  The patient remained hemodynamically stable and was  transported to the SICU in guarded but stable condition.      Evelene Croon,  M.D.  Electronically Signed     BB/MEDQ  D:  02/26/2007  T:  02/28/2007  Job:  [id number]   cc:   Francisca December, M.D.

## 2010-06-04 NOTE — Discharge Summary (Signed)
NAMEYANI, LAL            ACCOUNT NO.:  [id number]   MEDICAL RECORD NO.:  [id number]          PATIENT TYPE:  INP   LOCATION:  2006                         FACILITY:  [facility]   PHYSICIAN:  Evelene Croon, M.D.     DATE OF BIRTH:  [birth year]   DATE OF ADMISSION:  02/23/2007  DATE OF DISCHARGE:  03/04/2007                               DISCHARGE SUMMARY   HISTORY OF PRESENT ILLNESS:  Mr. Schwabe is a 77 year old man with  chronic atrial fibrillation and a history of coronary artery disease  dating back to the late 1990s.  Over the past 2 to 3 weeks, he has been  noticing nonexertional right shoulder pain.  Initially, he felt it was  musculoskeletal.  Over the 3 days prior to admission, the episodes were  more severe in the early morning.  On the date of admission, he awoke at  4 o'clock with right shoulder pain and worsening shortness of breath  associated with cough.  EMS was called and he was felt to be in  respiratory distress with physical findings consistent with pulmonary  edema.  He received IV morphine, nitroglycerin, and Lasix.  He was  transferred to the emergency room and placed on CPAP and subsequently  BiPAP.  At that time, he was comfortable upon arrival to the emergency  room.  His shoulder pain has resolved.  He was seen promptly by  cardiology and felt to require admission for further evaluation and  treatment.  His initial CK-MB was 17 with subsequent followup 183.  His  MB band was 25.  His initial reading at point of care troponin was 0.43.  He was placed on intravenous nitroglycerin given additional Lasix and  started on heparin.  He was admitted to the TCU.   PAST MEDICAL HISTORY:  Includes the following:  1. Hyperlipidemia.  2. Hypertension.  3. Atrial fibrillation.  4. Long-term Coumadin use.  5. History of mitral regurgitation.   PAST SURGICAL HISTORY:  Includes:  1. Hip fracture, 2004, status post ORIF.  2. Bilateral total knee replacements.  3.  History of exploratory laparotomy for a colon perforation in 1991.   ALLERGIES:  NONE KNOWN.   MEDICATIONS PRIOR TO ADMISSION:  1. Carvedilol 25 mg b.i.d.  2. Lisinopril 10 mg daily.  3. Diltiazem LA 180 mg daily.  4. Crestor 5 mg daily.  5. Lovaza 2 mg daily.  6. Coumadin adjusted dose daily.   FAMILY HISTORY:  Please see the history and physical done at the time of  admission.   SOCIAL HISTORY:  Please see the history and physical done at the time of  admission.   REVIEW OF SYMPTOMS:  Please see the history and physical done at the  time of admission.   PHYSICAL EXAM:  Please see the history and physical done at the time of  admission.   HOSPITAL COURSE:  Patient was admitted.  He was scheduled for and  underwent cardiac catheterization.  This revealed significant left main  and 3-vessel coronary artery disease.  For full details, please see the  report.  He did rule in for a  non-ST segment elevation myocardial  infarction.  Of note, on cath, the left ventricular ejection fraction  was about 30% to 40% with 2+ mitral regurgitation which may have been at  least in part catheter related.  Due to these findings, cardiac surgical  consultation was obtained with Evelene Croon, M.D., who evaluated the  patient and studies and agreed with recommendations to proceed with  surgical revascularization.   PROCEDURE:  On February 26, 2007, he was taken to the cardiac operating  room where he underwent the following procedure, coronary artery bypass  graft x4.  The following grafts were placed:  1. Left internal mammary artery to the LAD.  2. Saphenous vein graft to the obtuse marginal.  3. Saphenous vein graft to the posterior descending branch of the      circumflex marginal.  4. Saphenous vein graft to the distal right coronary artery.      Additionally, he underwent a mitral valve repair with a 28-mm MEMO      annuloplasty ring.  He tolerated this procedure well.  Initially,       preoperatively by TE, he was noted to have 3 to 4+ mitral      regurgitation.  None post procedure.   POSTOPERATIVE HOSPITAL COURSE:  Patient has done quite well.  He has  remained hemodynamically stable.  He has continued in chronic atrial  fibrillation.  He has been restarted on Coumadin.  His rate has been  controlled.  He initially has frequent premature ventricular  contractions.  He was started on amiodarone but this was discontinued  due to slow ventricular response.  His beta-blocker, carvedilol, has  been restarted.  He is tolerating this.  All routine lines, monitors,  and drainage devices have been discontinued in a standard fashion.  His  incisions are healing well without evidence of infection.  He does  remain to be moderately volume overloaded, will require further diuresis  as an outpatient.  Oxygen has been weaned and he maintained saturations  of 100% on room air.  He is tolerating a gradual increase in activity  using standard protocols.  His overall status is felt to be acceptable  for discharge on today's date, March 04, 2007.   CONDITION ON DISCHARGE:  Stable and improving.   MEDICATIONS ON DISCHARGE:  Are as follows:  1. Aspirin 81 mg daily.  2. Coreg 6.25 mg twice daily.  3. Altace 2.5 mg daily.  4. Crestor 5 mg daily.  5. Oxycodone 1 to 2 every 4 to 6 hours as needed for pain.  6. Coumadin 2.5 mg daily and as directed.  His most recent INR on      March 04, 2007, is 2.3.  7. Lasix 40 mg daily for 7 days.  8. Potassium chloride 20 mEq daily for 7 days.   ADDITIONAL INSTRUCTIONS:  The patient received written instructions in  regard to medications, activity, diet, wound care, and followup.   FOLLOWUP:  Include Dr. Laneta Simmers, 3 weeks, Dr. Amil Amen, 2 weeks.  He will  continue to have his Coumadin evaluated and managed by Dr. Earl Gala.   FINAL DIAGNOSES:  1. Non-ST segment myocardial infarction with severe left main and 3-      vessel coronary artery  disease.  2. Reduced ejection fraction.  3. Chronic atrial fibrillation.  4. Frequent premature ventricular contractions.  5. Hyperlipidemia.  6. Hypertension.  7. Chronic Coumadin anticoagulation.  8. Mitral regurgitation, now status post ring annuloplasty.  9. History of previous hip  fracture status post open reduction and      internal fixation.  10.Bilateral total knee replacements.  11.History of laparotomy for colon perforation secondary to      diverticular disease status post colostomy and takedown.  12.Postoperative anemia, most recent hemoglobin and hematocrit dated      March 01, 2007, was 8.8 and 25.4 respectively.      Rowe Clack, P.A.-C.      Evelene Croon, M.D.  Electronically Signed    WEG/MEDQ  D:  03/04/2007  T:  03/05/2007  Job:  [id number]   cc:   [name], M.D.  Evelene Croon, M.D.  Francisca December, M.D.

## 2010-06-04 NOTE — H&P (Signed)
NAMEWHITTEN, ANDREONI            ACCOUNT NO.:  [id number]   MEDICAL RECORD NO.:  [id number]          PATIENT TYPE:  INP   LOCATION:  2807                         FACILITY:  [facility]   PHYSICIAN:  Francisca December, M.D.  DATE OF BIRTH:  December 28, 1928   DATE OF ADMISSION:  02/23/2007  DATE OF DISCHARGE:                              HISTORY & PHYSICAL   REASON FOR ADMISSION:  Acute shortness of breath with cough.   HISTORY OF PRESENT ILLNESS:  [name] is a 77 year old man  with chronic atrial fibrillation and a history of minor CAD dating to  the late 1990s.  He had only minor branch obstruction at the time.  Over  the past 2-3 weeks, he has been noticing nonexertional right shoulder  pain.  Initially thought this was musculoskeletal.  Over the past 3  days, he has had more severe episodes in the early morning.  He awoke  this morning at 0400 with right shoulder pain and worsening shortness of  breath with cough.  EMS was called, and he was felt to be in respiratory  distress with physical findings of pulmonary edema.  He received IV  morphine, nitroglycerin, and IV furosemide.  Doses are unknown.  He was  transported to the emergency room and placed on CPAP, then BiPAP.  At  the time of my evaluation at 0800, he is comfortable without the BiPAP.  His right shoulder pain has resolved.  Cardiac point-of-care enzymes  have been elevated.   PAST MEDICAL HISTORY:  1. Hyperlipidemia.  2. Hypertension.  3. Atrial fibrillation.  4. Long-term Coumadin use.  5. Known mitral regurgitation.   PAST SURGICAL HISTORY:  1. Hip fracture in 2004, status post ORIF  2. Bilateral TKRs.  3. Exploratory laparotomy for colon perforation in 1991.   SOCIAL HISTORY:  No ethanol or tobacco use.  He comes in with his second  wife, having been remarried recently after being a widower x10 years.   FAMILY HISTORY:  Not significant for early coronary disease.   DRUG ALLERGIES:  None known.   CURRENT MEDICATIONS:  1. Carvedilol 25 mg p.o. b.i.d.  2. Lisinopril 10 mg p.o. daily.  3. Diltiazem LA 180 mg p.o. daily.  4. Crestor 5 mg p.o. daily.  5. Lovaza 2 mg p.o. daily.  6. Adjusted-dose warfarin.  7. He did receive aspirin 81 mg x4 by EMS.   REVIEW OF SYSTEMS:  Twelve systems are negative except for chronic knee  pain and some mechanical low back pain and bilateral hip pain.  Denies  any tachy palpitations or lightheadedness.  No syncope or presyncope.  No PND or orthopnea until this morning.  No lower extremity claudication  symptoms.   PHYSICAL EXAMINATION:  VITAL SIGNS:  Blood pressure in the emergency  room was 116/77, pulse 73 and irregularly irregular, respiratory rate  20, temperature is afebrile.  GENERAL:  Well-appearing, comfortable, 77 year old gentleman in no  distress.  HEENT:  Unremarkable.  Head is atraumatic and normocephalic.  Pupils  equal, round, and reactive to light.  Sclerae are anicteric.  Oral  mucosa is pink and moist.  Teeth and gums in good repair. Tongue is not  coated.  NECK:  Supple without thyromegaly or masses.  The carotid upstrokes are  normal.  There is no bruit.  There is no jugular venous distention.  CHEST:  His chest has bilateral rales in the bases.  No wheezing.  No  rhonchi.  HEART:  Has irregular rhythm.  Normal S1-S2 is heard.  I do not hear a  systolic murmur of MR, no ejection systolic murmur.  The PMI is over the  left ventricular apex, sustained throughout systole and nondisplaced.  ABDOMEN:  Soft, flat, nontender without hepatosplenomegaly or midline  pulsatile mass.  Bowel sounds present in all quadrants.  EXTERNAL GENITALIA:  Normal male phallus, descended testicles.  No  lesion.  RECTAL:  Not performed.  EXTREMITIES:  Show full range of motion.  No edema.  Diminished pedal  pulses. Easily palpable femoral pulses.  NEUROLOGIC:  Cranial nerves II-XII intact.  Motor and sensory grossly  intact.  Gait not tested.   SKIN:  Warm, dry and clear.   ACCESSORY CLINICAL DATA:  Admission hemogram, serum electrolytes, BUN,  creatinine, glucose all within normal limits.  PT and INR 24.4 and 2.1,  respectively. CK-MB initial 17, subsequent followup 183.  Total CK-MB  25.  The initial reading was at point of care troponin 0.43.   Electrocardiogram shows atrial fibrillation, occasional PVCs and lateral  precordial slight ST depression 1 mm.   Chest x-ray shows some cardiac enlargement, bilateral mild edema in the  bases.   IMPRESSION:  1. ST-elevation myocardial infarction complicated by congestive heart      failure, pulmonary edema.  2. Atypical angina, right shoulder, not necessarily exertional.  3. Hypertension.  4. Chronic atrial fibrillation.  5. Long-term Coumadin use.  6. Hyperlipidemia.  7. Known mitral vegetation  8. Degenerative joint disease status post hip and knee replacements.   PLAN:  The patient is treated medically well in the emergency room with  IV nitroglycerin, furosemide, and a 4000 unit dose of heparin.  No drip  has been initiated.  He has had 1100 mL of urine output.  We will hold  the Coumadin and probably initiate vitamin K.  Continue home medications  and plan for a TCU admission.  Will continue to cycle enzymes and repeat  EKG.   Finally, I have recommended and he has accepted cardiac catheterization,  coronary angiography, possible PCI.  Goals, risks, and alternatives were  discussed.  The patient states understanding, has had his questions  answered, and wishes to proceed.  Given that he can lie flat and had  excellent urine output this morning, I think it is reasonable to proceed  on an urgent basis.      Francisca December, M.D.  Electronically Signed     JHE/MEDQ  D:  02/23/2007  T:  02/23/2007  Job:  [id number]   cc:   [name], M.D.

## 2010-06-04 NOTE — H&P (Signed)
NAMEEMANUELL, MORINA NO.:  [id number]   MEDICAL RECORD NO.:  [id number]          PATIENT TYPE:  INP   LOCATION:  2807                         FACILITY:  [facility]   PHYSICIAN:  Guy Franco, P.A.       DATE OF BIRTH:  Dec 17, 1928   DATE OF ADMISSION:  02/23/2007  DATE OF DISCHARGE:                              HISTORY & PHYSICAL   CHIEF COMPLAINT:  Call, right shoulder discomfort.   HISTORY OF PRESENT ILLNESS:  Mr. Mehring is a 77 year old male patient  who tells me he has no history of coronary artery disease but does have  a history of atrial fibrillation on long-term Coumadin use.  The patient  does complain of a 40 year history of a persistent dry cough and  accompanied with this is a right anterior stress shoulder discomfort.  He  does exercise using weights daily and states he does not have  discomfort with exercise.  Unfortunately, his symptoms are worse this  morning.  He presented to the emergency room where he ruled in for non-  ST segment elevated myocardial infarction.  He denies any palpitations,  PND, orthopnea, dizziness or syncope.   REVIEW OF SYSTEMS:  Review of systems otherwise negative.  The patient  is now here for urgent cardiac catheterization.   PAST MEDICAL HISTORY:  Past medical history of hyperlipidemia,  hypertension, atrial fibrillation long-term Coumadin use.   SOCIAL HISTORY:  No tobacco, alcohol or illicit drug use.   FAMILY HISTORY:  Dad with a history of coronary artery disease.   ALLERGIES:  None.   MEDICATIONS:  He says he is on some blood pressure medicines, but he  cannot remember what his medications are. He does remember that he is on  Coumadin   PHYSICAL EXAMINATION:  VITAL SIGNS:  Blood pressure 116/77, pulse 73,  respirations 20.  HEENT: Grossly normal.  No carotid or subclavian bruits.  No JVD or  thyromegaly.  Sclerae clear conjunctivae normal nares without drainage.  CHEST:  Clear to auscultation bilaterally.   No wheezing or rhonchi.  HEART:  Regular rate and rhythm.  No gross murmur.  ABDOMEN:  Good bowel sounds, nontender, nondistended.  No mass.  EXTREMITIES:  Lower extremities pedal edema. Palpable warm pulses. His  lower extremities are cool  to touch can feel faint dorsalis pedis  pulses bilaterally.  NEUROLOGIC: Alert and oriented x3.   LAB STUDIES:  Hemoglobin 15.0, hematocrit 44.7, white count 11.5, BUN  13, creatinine 1.2, potassium 4.0, PT 24.4, INR 2.1, CK-MB 183/25 0.1,  troponin 0.43.  EKG shows atrial fibrillation with 1 mm ST-segment depression in the  lateral leads.  Chest x-ray this time pending.   ASSESSMENT:  1. Non-ST segment elevated myocardial infarction.  2. Hypertension.  3. Atrial fibrillation.  4. Hyperlipidemia.  5. Long-term Coumadin use.   PLAN:  The patient has been brought to the cardiac catheterization lab  emergently.  Will continue to cycle cardiac isoenzymes.  Check fasting  lipid panel.  The patient was seen in by Dr. Corliss Marcus.      Guy Franco, P.A.  LB/MEDQ  D:  02/23/2007  T:  02/23/2007  Job:  [id number]   cc:   Francisca December, M.D.

## 2010-06-04 NOTE — Cardiovascular Report (Signed)
NAMETHAYER, EMBLETON            ACCOUNT NO.:  [id number]   MEDICAL RECORD NO.:  [id number]          PATIENT TYPE:  INP   LOCATION:  2807                         FACILITY:  [facility]   PHYSICIAN:  Francisca December, M.D.  DATE OF BIRTH:  1928/11/17   DATE OF PROCEDURE:  02/23/2007  DATE OF DISCHARGE:                            CARDIAC CATHETERIZATION   PROCEDURES PERFORMED:  1. Left heart catheterization.  2. Left ventriculogram.  3. Coronary angiography.  4. Percutaneous closure right femoral artery.   INDICATIONS:  Trevor Webb is a p47 year old man with a remote  history of minor CAD.  He has been admitted this morning with acute  pulmonary edema and an NSTEMI.  The edema has been treated with IV  nitroglycerin and IV furosemide with an excellent diuresis.  The patient  is comfortable lying flat.  His creatinine is 1.2 and INR is 2.1.  He is  brought to the catheterization laboratory, at this time, to identify the  extent of disease and provide for further therapeutic options.   PROCEDURAL NOTE:  The patient IS brought to the cardiac catheterization  laboratory in the fasting state.  The right groin was prepped and draped  in the usual sterile fashion.  Local anesthesia was obtained with  infiltration of 1% lidocaine.  A 6-French catheter sheath was inserted  percutaneously into the right femoral artery utilizing an anterior  approach over a guiding J-wire.  Left heart catheterization proceeded in  the standard fashion using 6-French #4 left-and-right Judkins catheters,  and a 110-cm pigtail catheter.  A 30-degree RAO cine left ventriculogram  was performed utilizing a power injector.  Then 42 mL were injected at  13 mL per second.  Cannulation of the right coronary did require  exchange of the right Judkins for a #1 left Amplatz catheter as it did  have an anterior origin.   At the completion of the procedure a right femoral arteriogram in the 45-  degree RAO angulation via  the catheter sheath by hand injection  documented adequate anatomy for placement of percutaneous closure device  Angio-Seal.  This was subsequently deployed with good hemostasis and  intact distal pulse.   HEMODYNAMICS:  Systemic arterial pressure was 154/91 with a mean of 116  mmHg.  This no systolic gradient across the aortic valve.  The left  ventricular end-diastolic pressure was 26 mmHg preventriculogram.   ANGIOGRAPHY:  1. The left ventriculogram demonstrated mild chamber dilatation and      moderate left ventricular systolic dysfunction in a global fashion.      A visual estimated ejection fraction is in the 35%-40% range.      There is 2+ mitral regurgitation.  The aortic valve is trileaflet      and opens normally during systole.  Left coronary calcification is      seen.  2. There is a left dominant coronary system present.  The main left      coronary artery is highly diseased; there is a focal mid-80%      narrowing.  3. The left anterior descending artery and its branches are without  significant obstruction.  Two diagonal branches arise, both of      which are relatively small, and there are minor narrowing at the      origin.  The ongoing anterior descending artery contains a 30%      narrowing in the proximal portion, and a 30% focal narrowing in the      midportion.  The distal vessel reaches and traverses the apex.  4. The left circumflex coronary artery is dominant.  There is a small-      to-moderate size first marginal branch that has a 60%-70% stenosis      in the origin.  The ongoing circumflex then does not give any      significant marginal branches.  It gives rise to 3 obtuse marginals      and a posterior descending artery distally.  All 3 of these vessels      are large and without significant obstruction.  5. The right coronary artery is small and nondominant.  It does have      an anterior origin on the aorta.  There is a 70% focal narrowing in       the proximal segment.  The distal vessel gives a single branch to      the basal portion of the inferior septum.  Otherwise right      ventricular branches only are seen.  6. Collateral vessels are not seen.   FINAL IMPRESSION:  1. Systemic hypertension.  Initial blood pressure was 170/105.  The      patient received 20 mg of labetalol during the procedure, and an      increase in his IV nitroglycerin with an adequate decrease in his      systemic blood pressure.  2. Elevated left ventricular end-diastolic pressure, moderate to      severe  3. Moderate left ventricular global systolic dysfunction secondary to      left main occlusion and ischemia.  4. Left main coronary disease as described above.   PLAN:  The patient will be treated medically for now with IV  nitroglycerin, subcutaneous Lovenox, and aspirin.  Furosemide will be  administered on a p.r.n. basis.  Home medications will be continued as  long as the systolic blood pressure is not below 110.  Vitamin K to be  administered today.   Finally, I will obtain the opinion of my colleague regarding the  possibility of an unprotected left main drug-eluting stent percutaneous  intervention.  His status for coronary bypass surgery is moderate risk  given his age in LV dysfunction.  Will proceed as appropriate.      Francisca December, M.D.  Electronically Signed     JHE/MEDQ  D:  02/23/2007  T:  02/23/2007  Job:  [id number]   cc:   [name], M.D.

## 2010-06-04 NOTE — Consult Note (Signed)
NAMESIERRA, Webb            ACCOUNT NO.:  [id number]   MEDICAL RECORD NO.:  [id number]          PATIENT TYPE:  INP   LOCATION:  2920                         FACILITY:  [facility]   PHYSICIAN:  Evelene Croon, M.D.     DATE OF BIRTH:  06/19/1928   DATE OF CONSULTATION:  02/24/2007  DATE OF DISCHARGE:                                 CONSULTATION   REASON FOR CONSULTATION:  Left main and three-vessel coronary artery  disease, status post non-ST-segment elevation MI.   CLINICAL HISTORY:  I was asked by Dr. Amil Amen to evaluate [name]  for consideration of coronary artery bypass graft surgery.  He is a 77-  year-old gentleman with a history of hypertension and hyperlipidemia as  well as chronic atrial fibrillation on long-term Coumadin and known  mitral regurgitation of unclear severity.  He presented with a 2-3-week  history of intermittent nonexertional right shoulder pain radiating into  the right upper arm.  He initially thought this was musculoskeletal from  working out at the gym.  These episodes worsened and over the 3 days  prior to admission he had severe episodes early in the morning.  Yesterday morning he awoke with severe pain as well as shortness of  breath with cough about 4 o'clock in the morning and called EMS, where  he was to be in respiratory distress with pulmonary edema.  He was  treated with intravenous morphine, nitroglycerin and Lasix as well as  CPAP and BiPAP.  He improved and became asymptomatic in the emergency  room.  His initial enzymes was positive for a non-ST segment elevation  MI with a CPK of 183 and an MB of 25 and a troponin of 0.43.  The  symptoms increased with a CPK of 335, an MB of 35 and troponin of 11.49.  He has remained asymptomatic since admission.  Cardiac catheterization  was performed yesterday, which showed the left main to have a focal mid  80% narrowing.  The LAD had no significant stenoses in it with about 30%  proximal and 30%  mid narrowing.  The left circumflex was a dominant  vessel that had 60-70% stenosis in a small to moderate-sized first  marginal branch.  It terminated as three large obtuse marginals and a  posterior descending distally, and none of these vessels had significant  disease in them.  The right coronary was small, nondominant vessel that  had 70% focal proximal narrowing.  Left ventricular ejection fraction  was about 30-40% with 2+ mitral regurgitation that looked like it may be  catheter-related.  He is in chronic atrial fibrillation.  There was no  gradient across the aortic valve.  Left ventricular end-diastolic  pressure was 26.   PAST MEDICAL HISTORY:  1. Hyperlipidemia.  2. Hypertension.  3. He has a history of chronic atrial fibrillation, which he said is      for at least last 4 or 5 years and has been on Coumadin for that.  4. He has a known history of mitral regurgitation.  5. He has a history of hip fracture in 2004, status post ORIF.  6. He is status post bilateral total knee replacements in about 1991.  7. He is status post exploratory laparotomy for a colon perforation      secondary to diverticular disease in 1991 and had a colostomy for      awhile, which was subsequently taken down.  He is followed by Dr.      Nadine Counts Buccini.   REVIEW OF SYSTEMS:  GENERAL:  He denies any fever or chills.  He has had  no recent weight changes.  He denies fatigue.  EYES:  Negative.  ENT:  Negative.  ENDOCRINE:  He denies diabetes and hypothyroidism.  CARDIOVASCULAR:  As above.  He denies any chest pain or pressure.  He  denies shortness of breath prior to the episode yesterday.  He has had  mild edema in his left leg chronically since his knee replacement.  This  is primarily at the ankle level.  He denies any palpitations.  He does  have chronic atrial fibrillation.  RESPIRATORY:  He denies any prior  exertional dyspnea.  He did have a cough yesterday with the shortness of  breath.  He  denies sputum production.  GI:  He denies nausea or  vomiting.  He denies melena and bright red blood per rectum.  GU:  He  denies dysuria and hematuria.  MUSCULOSKELETAL:  He denies arthralgias  and myalgias.  He does have degenerative joint disease.  He said that  his knee replacements have become worn and he has been told that he may  need revision of them.  NEUROLOGICAL:  He denies any focal weakness or  numbness.  He denies dizziness and syncope.  He has never had a TIA or a  stroke.  ALLERGIES:  None.  PSYCHIATRIC: negative.  HEMATOLOGIC:  Negative.  He has been on chronic Coumadin for at least 4 or 5 years.   MEDICATIONS PRIOR TO ADMISSION:  1. Carvedilol 25 mg b.i.d.  2. Lisinopril 10 mg daily.  3. Cardizem LA 180 mg daily.  4. Crestor 5 mg daily.  5. Lovaza 2 mg daily.  6. Coumadin.  7. Aspirin, which was started by EMS.   SOCIAL HISTORY:  He is retired.  He lives his second wife, having  remarried recently after being a widower.  He denies smoking and alcohol  use.   FAMILY HISTORY:  Negative for coronary disease.   PHYSICAL EXAMINATION:  Blood pressure is 102/58 and his pulse of 77 and  irregular.  Respiratory rate is 16 and unlabored.  He is a well-developed white male in no distress.  HEENT:  Normocephalic and atraumatic.  Pupils are equal and reactive to  light and accommodation.  Extraocular muscles are intact.  His throat is  clear.  NECK:  Normal carotid pulses bilaterally.  There are no bruits.  There  is no adenopathy or thyromegaly.  CARDIAC:  An irregular rate and rhythm with normal S1 and S2.  There is  no murmur or gallop.  LUNGS:  Clear.  ABDOMEN:  Active bowel sounds.  ABDOMEN:  Soft and nontender.  There are no palpable masses or  organomegaly.  There are old laparotomy and colostomy scars.  EXTREMITIES:  Mild edema in the left ankle.  Pedal pulses are strongly  palpable bilaterally.  There are incisions over both knees from prior  knee  replacement.  NEUROLOGIC:  Alert and oriented x3.  Motor and sensory exams grossly  normal.  SKIN:  Warm and dry.   IMPRESSION:  Trevor Webb  has high-grade left main and three-vessel  coronary artery disease, presenting with unstable angina, non-ST segment  elevation myocardial infarction and pulmonary edema.  I agree that  coronary artery bypass graft surgery is the best treatment to prevent  further ischemia and infarction.  He does have a history of mitral  regurgitation of unclear severity but did not have a murmur on exam and  it appeared that the mitral regurgitation seen on catheterization may be  catheter-related.  He has had an echocardiogram since admission, and I  will follow up on the result of that.  We will evaluate his mitral valve  in the operating room with a transesophageal echocardiogram to assist in  decision-making of whether any intervention is needed for that.  He does  have a history of chronic atrial fibrillation, which he describes for  least 4 or 5 years on Coumadin therapy and has been rate-controlled.  I  would not plan on performing a maze procedure in this 77 year old  gentleman with coronary disease and other comorbidities because I think  the long-term benefits do not outweigh the risk of the additional  surgery.  I discussed the operative procedure with him including  alternatives, benefits and risks including but not limited to  bleeding, blood transfusion, infection, stroke, myocardial infarction,  graft failure, and death.  He understands and agrees to proceed.  We  will plan to do surgery on Friday, February 26, 2007, as well as he  remains stable without symptoms.  If he develops any further symptoms, I  would plan on performing the surgery urgently.      Evelene Croon, M.D.  Electronically Signed     BB/MEDQ  D:  02/24/2007  T:  02/25/2007  Job:  [id number]

## 2010-06-04 NOTE — Assessment & Plan Note (Signed)
OFFICE VISIT   Trevor Webb, Trevor Webb A  DOB:  1928-10-06                                        March 23, 2007  CHART #:  [id number]   Trevor Webb returned today for followup status post coronary artery  bypass graft surgery x4 and mitral annuloplasty on February 26, 2007. He  has a history of chronic atrial fibrillation and had been on Coumadin  ever since that was diagnosed. He has been feeling well overall and is  walking daily without chest pain or shortness of breath. He has been  going to the [facility]   On physical examination today, his blood pressure is 136/67, and his  pulse is 76 and regular. Respiratory rate is 18 and unlabored. Oxygen  saturation on room air is 97%. Cardiac exam shows an irregular rate and  rhythm. There is no murmur. His lungs are clear. The chest incision is  healing well, and the sternum is stable. His leg incision is healing  well. There is mild right lower leg edema from the knee down.   Followup chest x-ray today shows clear lung fields. There is minimal  left effusion.   MEDICATIONS:  1. Carvedilol 6.25 mg b.i.d.  2. Crestor 5 mg daily.  3. Coumadin as directed.  4. Aspirin __________ .  5. Lovaza 1 gram daily.  6. Ramipril 2.5 mg daily.   IMPRESSION:  Overall, Trevor Webb is making a good recovery following  his surgery. He has a history of chronic atrial fibrillation and will  remain on Coumadin. I encouraged him to continue walking as much as  possible. 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 He will continue to followup with Dr. Amil Amen and Dr.  Earl Gala and will contact me if he develops any problems with his  incisions.   Evelene Croon, M.D.  Electronically Signed   BB/MEDQ  D:  03/23/2007  T:  03/23/2007  Job:  [id number]   cc:   Dr. Marlon Pel, M.D.

## 2010-10-08 ENCOUNTER — Emergency Department (HOSPITAL_BASED_OUTPATIENT_CLINIC_OR_DEPARTMENT_OTHER)
Admission: EM | Admit: 2010-10-08 | Discharge: 2010-10-08 | Disposition: A | Discharge status: Other Acute Inpatient Hospital | Payer: Medicare Other | Source: Home / Self Care | Attending: Emergency Medicine | Admitting: Emergency Medicine

## 2010-10-08 ENCOUNTER — Inpatient Hospital Stay (HOSPITAL_COMMUNITY)
Admission: AD | Admit: 2010-10-08 | Discharge: 2010-10-14 | DRG: 292 | Disposition: A | Discharge status: Skilled Nursing Facility | Payer: Medicare Other | Source: Other Acute Inpatient Hospital | Attending: Internal Medicine | Admitting: Internal Medicine

## 2010-10-08 ENCOUNTER — Emergency Department (INDEPENDENT_AMBULATORY_CARE_PROVIDER_SITE_OTHER): Payer: Medicare Other

## 2010-10-08 ENCOUNTER — Other Ambulatory Visit: Payer: Self-pay

## 2010-10-08 DIAGNOSIS — M7989 Other specified soft tissue disorders: Secondary | ICD-10-CM

## 2010-10-08 DIAGNOSIS — Z951 Presence of aortocoronary bypass graft: Secondary | ICD-10-CM

## 2010-10-08 DIAGNOSIS — J189 Pneumonia, unspecified organism: Secondary | ICD-10-CM

## 2010-10-08 DIAGNOSIS — I5023 Acute on chronic systolic (congestive) heart failure: Principal | ICD-10-CM | POA: Diagnosis present

## 2010-10-08 DIAGNOSIS — R0602 Shortness of breath: Secondary | ICD-10-CM

## 2010-10-08 DIAGNOSIS — I509 Heart failure, unspecified: Secondary | ICD-10-CM

## 2010-10-08 DIAGNOSIS — K219 Gastro-esophageal reflux disease without esophagitis: Secondary | ICD-10-CM | POA: Diagnosis present

## 2010-10-08 DIAGNOSIS — F0280 Dementia in other diseases classified elsewhere without behavioral disturbance: Secondary | ICD-10-CM | POA: Diagnosis present

## 2010-10-08 DIAGNOSIS — I498 Other specified cardiac arrhythmias: Secondary | ICD-10-CM | POA: Diagnosis present

## 2010-10-08 DIAGNOSIS — I2510 Atherosclerotic heart disease of native coronary artery without angina pectoris: Secondary | ICD-10-CM | POA: Diagnosis present

## 2010-10-08 DIAGNOSIS — E876 Hypokalemia: Secondary | ICD-10-CM | POA: Diagnosis present

## 2010-10-08 DIAGNOSIS — R079 Chest pain, unspecified: Secondary | ICD-10-CM

## 2010-10-08 DIAGNOSIS — J90 Pleural effusion, not elsewhere classified: Secondary | ICD-10-CM

## 2010-10-08 DIAGNOSIS — G309 Alzheimer's disease, unspecified: Secondary | ICD-10-CM | POA: Diagnosis present

## 2010-10-08 DIAGNOSIS — Z7901 Long term (current) use of anticoagulants: Secondary | ICD-10-CM

## 2010-10-08 DIAGNOSIS — I10 Essential (primary) hypertension: Secondary | ICD-10-CM | POA: Diagnosis present

## 2010-10-08 DIAGNOSIS — I4891 Unspecified atrial fibrillation: Secondary | ICD-10-CM | POA: Diagnosis present

## 2010-10-08 DIAGNOSIS — I69959 Hemiplegia and hemiparesis following unspecified cerebrovascular disease affecting unspecified side: Secondary | ICD-10-CM

## 2010-10-08 LAB — DIFFERENTIAL
Basophils Absolute: 0 10*3/uL (ref 0.0–0.1)
Lymphocytes Relative: 16 % (ref 12–46)
Neutro Abs: 6.6 10*3/uL (ref 1.7–7.7)

## 2010-10-08 LAB — CBC
HCT: 40.6 % (ref 39.0–52.0)
Platelets: 254 10*3/uL (ref 150–400)
RDW: 13.8 % (ref 11.5–15.5)
WBC: 9 10*3/uL (ref 4.0–10.5)

## 2010-10-08 LAB — BASIC METABOLIC PANEL
Calcium: 8.9 mg/dL (ref 8.4–10.5)
Chloride: 106 mEq/L (ref 96–112)
Creatinine, Ser: 0.9 mg/dL (ref 0.50–1.35)
GFR calc Af Amer: >60 mL/min (ref >60)
Sodium: 143 mEq/L (ref 135–145)

## 2010-10-08 LAB — PROTIME-INR: Prothrombin Time: 28.6 seconds — ABNORMAL HIGH (ref 11.6–15.2)

## 2010-10-08 LAB — TROPONIN I: Troponin I: <0.3 ng/mL (ref <0.30)

## 2010-10-08 LAB — MRSA PCR SCREENING: MRSA by PCR: NEGATIVE

## 2010-10-08 MED ORDER — NITROGLYCERIN 2 % TD OINT
TOPICAL_OINTMENT | TRANSDERMAL | Status: AC
  Filled 2010-10-08: qty 1

## 2010-10-08 MED ORDER — PIPERACILLIN-TAZOBACTAM 3.375 G IVPB
INTRAVENOUS | Status: AC
  Filled 2010-10-08: qty 50

## 2010-10-08 MED ORDER — VANCOMYCIN HCL IN DEXTROSE 1-5 GM/200ML-% IV SOLN
INTRAVENOUS | Status: AC
  Filled 2010-10-08: qty 200

## 2010-10-08 MED ORDER — LISINOPRIL 10 MG PO TABS
ORAL_TABLET | ORAL | Status: AC
  Filled 2010-10-08: qty 1

## 2010-10-08 MED ORDER — FUROSEMIDE 10 MG/ML IJ SOLN
INTRAMUSCULAR | Status: AC
  Filled 2010-10-08: qty 4

## 2010-10-08 MED FILL — Nitroglycerin SL Tab 0.4 MG: SUBLINGUAL | Qty: 25 | Status: AC

## 2010-10-08 MED FILL — Nitroglycerin Oint 2%: TRANSDERMAL | Qty: 1 | Status: AC

## 2010-10-08 MED FILL — Lisinopril Tab 10 MG: ORAL | Qty: 1 | Status: AC

## 2010-10-08 MED FILL — Piperacillin Sod-Tazobactam Sod in Dex IV Sol 3-0.375GM/50ML: INTRAVENOUS | Qty: 50 | Status: AC

## 2010-10-08 MED FILL — Furosemide Inj 10 MG/ML: INTRAMUSCULAR | Qty: 4 | Status: AC

## 2010-10-08 NOTE — ED Notes (Signed)
Attempted to call report, nurse unavailable.

## 2010-10-08 NOTE — ED Notes (Signed)
I cathed patient with a foley indwelling cath, size 16. I cleaned patient of wet diaper and placed new diaper on him. I collected urine sample in case it is ordered. Urine return from cath was approx. 1200 ml's, I notified nurse Vickie of same.

## 2010-10-08 NOTE — ED Notes (Signed)
I emptied cath bag, there were a total of 1,725 ml's urine since cath insertion to discharge.

## 2010-10-08 NOTE — ED Notes (Signed)
IV remains intact. Documented removed in documentation per protocol.

## 2010-10-09 ENCOUNTER — Inpatient Hospital Stay (HOSPITAL_COMMUNITY): Payer: Medicare Other

## 2010-10-09 DIAGNOSIS — I059 Rheumatic mitral valve disease, unspecified: Secondary | ICD-10-CM

## 2010-10-09 LAB — BASIC METABOLIC PANEL
BUN: 13 mg/dL (ref 6–23)
CO2: 30 mEq/L (ref 19–32)
Calcium: 8.7 mg/dL (ref 8.4–10.5)
Chloride: 105 mEq/L (ref 96–112)
Creatinine, Ser: 1.01 mg/dL (ref 0.50–1.35)
GFR calc Af Amer: >60 mL/min (ref >60)
GFR calc non Af Amer: >60 mL/min (ref >60)
Glucose, Bld: 86 mg/dL (ref 70–99)
Potassium: 3.1 mEq/L — ABNORMAL LOW (ref 3.5–5.1)
Sodium: 146 mEq/L — ABNORMAL HIGH (ref 135–145)

## 2010-10-09 LAB — CARDIAC PANEL(CRET KIN+CKTOT+MB+TROPI)
CK, MB: 2 ng/mL (ref 0.3–4.0)
CK, MB: 2.3 ng/mL (ref 0.3–4.0)
Relative Index: INVALID (ref 0.0–2.5)
Total CK: 29 U/L (ref 7–232)
Troponin I: <0.3 ng/mL (ref <0.30)
Troponin I: <0.3 ng/mL (ref <0.30)

## 2010-10-09 LAB — CBC
HCT: 38.7 % — ABNORMAL LOW (ref 39.0–52.0)
Hemoglobin: 12.5 g/dL — ABNORMAL LOW (ref 13.0–17.0)
MCH: 28.6 pg (ref 26.0–34.0)
MCHC: 32.3 g/dL (ref 30.0–36.0)
MCV: 88.6 fL (ref 78.0–100.0)
Platelets: 227 10*3/uL (ref 150–400)
RBC: 4.37 MIL/uL (ref 4.22–5.81)
RDW: 13.7 % (ref 11.5–15.5)
WBC: 7.7 10*3/uL (ref 4.0–10.5)

## 2010-10-09 LAB — PROTIME-INR
INR: 3.22 — ABNORMAL HIGH (ref 0.00–1.49)
Prothrombin Time: 33.4 seconds — ABNORMAL HIGH (ref 11.6–15.2)

## 2010-10-09 LAB — DIFFERENTIAL
Basophils Absolute: 0.1 10*3/uL (ref 0.0–0.1)
Basophils Relative: 1 % (ref 0–1)
Eosinophils Absolute: 0.1 10*3/uL (ref 0.0–0.7)
Eosinophils Relative: 2 % (ref 0–5)
Lymphocytes Relative: 18 % (ref 12–46)
Lymphs Abs: 1.4 10*3/uL (ref 0.7–4.0)
Monocytes Absolute: 0.8 10*3/uL (ref 0.1–1.0)
Monocytes Relative: 10 % (ref 3–12)
Neutro Abs: 5.4 10*3/uL (ref 1.7–7.7)
Neutrophils Relative %: 69 % (ref 43–77)

## 2010-10-09 LAB — BLOOD GAS, ARTERIAL
Acid-base deficit: 2.5 mmol/L — ABNORMAL HIGH (ref 0.0–2.0)
Bicarbonate: 22.3 mEq/L (ref 20.0–24.0)
Drawn by: 22251
O2 Content: 2 L/min
O2 Saturation: 96 %
Patient temperature: 98.6
TCO2: 23.6 mmol/L (ref 0–100)
pCO2 arterial: 42.5 mmHg (ref 35.0–45.0)
pH, Arterial: 7.341 — ABNORMAL LOW (ref 7.350–7.450)
pO2, Arterial: 82 mmHg (ref 80.0–100.0)

## 2010-10-09 LAB — PRO B NATRIURETIC PEPTIDE: Pro B Natriuretic peptide (BNP): 10575 pg/mL — ABNORMAL HIGH (ref 0–450)

## 2010-10-09 MED FILL — Aspirin Chew Tab 81 MG: ORAL | Qty: 1 | Status: AC

## 2010-10-09 MED FILL — Atenolol Tab 25 MG: ORAL | Qty: 1 | Status: AC

## 2010-10-10 LAB — BASIC METABOLIC PANEL
BUN: 16 mg/dL (ref 6–23)
CO2: 31 mEq/L (ref 19–32)
Calcium: 8.4 mg/dL (ref 8.4–10.5)
Chloride: 104 mEq/L (ref 96–112)
Creatinine, Ser: 1.38 mg/dL — ABNORMAL HIGH (ref 0.50–1.35)
GFR calc Af Amer: 60 mL/min — ABNORMAL LOW (ref >60)
GFR calc non Af Amer: 50 mL/min — ABNORMAL LOW (ref >60)
Glucose, Bld: 85 mg/dL (ref 70–99)
Potassium: 3.5 mEq/L (ref 3.5–5.1)
Sodium: 143 mEq/L (ref 135–145)

## 2010-10-10 LAB — GLUCOSE, CAPILLARY: Glucose-Capillary: 160 mg/dL — ABNORMAL HIGH (ref 70–99)

## 2010-10-10 LAB — PROTIME-INR
INR: 4 — ABNORMAL HIGH (ref 0.00–1.49)
Prothrombin Time: 39.6 seconds — ABNORMAL HIGH (ref 11.6–15.2)

## 2010-10-11 LAB — POCT I-STAT 3, ART BLOOD GAS (G3+)
Acid-base deficit: 2
Acid-base deficit: 4 — ABNORMAL HIGH
Acid-base deficit: 4 — ABNORMAL HIGH
Bicarbonate: 21.2
Bicarbonate: 26.5 — ABNORMAL HIGH
O2 Saturation: 98
Operator id: 148471
Operator id: 241461
Operator id: 251431
Patient temperature: 36
TCO2: 28
pCO2 arterial: 30 — ABNORMAL LOW
pCO2 arterial: 30.1 — ABNORMAL LOW
pCO2 arterial: 42.7
pH, Arterial: 7.4
pH, Arterial: 7.438
pO2, Arterial: 58 — ABNORMAL LOW
pO2, Arterial: 79 — ABNORMAL LOW

## 2010-10-11 LAB — POCT I-STAT 4, (NA,K, GLUC, HGB,HCT)
Glucose, Bld: 107 — ABNORMAL HIGH
Glucose, Bld: 113 — ABNORMAL HIGH
Glucose, Bld: 117 — ABNORMAL HIGH
Glucose, Bld: 138 — ABNORMAL HIGH
Glucose, Bld: 149 — ABNORMAL HIGH
Glucose, Bld: 176 — ABNORMAL HIGH
Glucose, Bld: 97
HCT: 20 — ABNORMAL LOW
HCT: 22 — ABNORMAL LOW
HCT: 23 — ABNORMAL LOW
HCT: 24 — ABNORMAL LOW
HCT: 26 — ABNORMAL LOW
Hemoglobin: 10.5 — ABNORMAL LOW
Hemoglobin: 6.8 — CL
Hemoglobin: 7.5 — CL
Hemoglobin: 7.8 — CL
Hemoglobin: 8.2 — ABNORMAL LOW
Hemoglobin: 8.8 — ABNORMAL LOW
Operator id: 241461
Operator id: 3342
Operator id: 3342
Operator id: 3342
Operator id: 3342
Operator id: 3342
Potassium: 3.9
Potassium: 4
Potassium: 4
Potassium: 4.1
Potassium: 4.3
Potassium: 5
Potassium: 5.3 — ABNORMAL HIGH
Sodium: 137
Sodium: 137
Sodium: 138
Sodium: 138
Sodium: 139
Sodium: 140

## 2010-10-11 LAB — CBC
HCT: 22.6 — ABNORMAL LOW
HCT: 23.8 — ABNORMAL LOW
HCT: 25.4 — ABNORMAL LOW
HCT: 25.4 — ABNORMAL LOW
HCT: 25.6 — ABNORMAL LOW
HCT: 35.1 — ABNORMAL LOW
HCT: 44.7
Hemoglobin: 12.1 — ABNORMAL LOW
Hemoglobin: 15
Hemoglobin: 8.3 — ABNORMAL LOW
Hemoglobin: 8.5 — ABNORMAL LOW
Hemoglobin: 8.7 — ABNORMAL LOW
Hemoglobin: 8.9 — ABNORMAL LOW
Hemoglobin: 9.3 — ABNORMAL LOW
MCHC: 34
MCHC: 34.1
MCHC: 34.3
MCHC: 34.5
MCHC: 34.7
MCHC: 35
MCV: 93.9
MCV: 94.2
MCV: 94.5
MCV: 94.6
MCV: 94.8
MCV: 95.1
MCV: 95.7
Platelets: 119 — ABNORMAL LOW
Platelets: 120 — ABNORMAL LOW
Platelets: 130 — ABNORMAL LOW
Platelets: 138 — ABNORMAL LOW
Platelets: 164
Platelets: 216
RBC: 2.38 — ABNORMAL LOW
RBC: 2.49 — ABNORMAL LOW
RBC: 2.64 — ABNORMAL LOW
RBC: 2.7 — ABNORMAL LOW
RBC: 2.7 — ABNORMAL LOW
RBC: 2.87 — ABNORMAL LOW
RBC: 4.7
RDW: 13
RDW: 13.3
RDW: 13.3
RDW: 14.3
RDW: 14.3
WBC: 10
WBC: 11.5 — ABNORMAL HIGH
WBC: 13.2 — ABNORMAL HIGH
WBC: 14.3 — ABNORMAL HIGH
WBC: 16.5 — ABNORMAL HIGH
WBC: 18.8 — ABNORMAL HIGH

## 2010-10-11 LAB — POCT I-STAT 3, VENOUS BLOOD GAS (G3P V)
Acid-Base Excess: 3 — ABNORMAL HIGH
Bicarbonate: 28.6 — ABNORMAL HIGH
O2 Saturation: 84
Operator id: 3342
TCO2: 30
pCO2, Ven: 45.9
pH, Ven: 7.403 — ABNORMAL HIGH
pO2, Ven: 49 — ABNORMAL HIGH

## 2010-10-11 LAB — BASIC METABOLIC PANEL
BUN: 15 mg/dL (ref 6–23)
BUN: 19
BUN: 24 — ABNORMAL HIGH
CO2: 30 mEq/L (ref 19–32)
CO2: 24
CO2: 26
CO2: 27
Calcium: 9 mg/dL (ref 8.4–10.5)
Chloride: 102 mEq/L (ref 96–112)
Chloride: 105
Chloride: 106
Chloride: 107
Chloride: 108
Creatinine, Ser: 1.16 mg/dL (ref 0.50–1.35)
Creatinine, Ser: 1.15
Creatinine, Ser: 1.37
GFR calc Af Amer: >60 mL/min (ref >60)
GFR calc Af Amer: >60
GFR calc Af Amer: >60
GFR calc Af Amer: >60
GFR calc non Af Amer: >60 mL/min (ref >60)
GFR calc non Af Amer: 57 — ABNORMAL LOW
Glucose, Bld: 90 mg/dL (ref 70–99)
Glucose, Bld: 107 — ABNORMAL HIGH
Glucose, Bld: 116 — ABNORMAL HIGH
Potassium: 3.5 mEq/L (ref 3.5–5.1)
Potassium: 3.5
Potassium: 3.9
Potassium: 3.9
Potassium: 4.5
Sodium: 143 mEq/L (ref 135–145)
Sodium: 137
Sodium: 138
Sodium: 140

## 2010-10-11 LAB — CK TOTAL AND CKMB (NOT AT ARMC)
CK, MB: 14 — ABNORMAL HIGH
CK, MB: 25.1 — ABNORMAL HIGH
CK, MB: 35.2 — ABNORMAL HIGH
Relative Index: 13.7 — ABNORMAL HIGH
Relative Index: 4.8 — ABNORMAL HIGH
Total CK: 183
Total CK: 294 — ABNORMAL HIGH
Total CK: 335 — ABNORMAL HIGH

## 2010-10-11 LAB — TYPE AND SCREEN
ABO/RH(D): O NEG
Antibody Screen: NEGATIVE

## 2010-10-11 LAB — COMPREHENSIVE METABOLIC PANEL
AST: 48 — ABNORMAL HIGH
Albumin: 3 — ABNORMAL LOW
Albumin: 3.5
BUN: 12
Chloride: 103
Creatinine, Ser: 1.06
Creatinine, Ser: 1.2
GFR calc Af Amer: >60
Sodium: 136
Total Bilirubin: 1.5 — ABNORMAL HIGH
Total Protein: 6.5

## 2010-10-11 LAB — I-STAT EC8
Acid-base deficit: 3 — ABNORMAL HIGH
Acid-base deficit: 3 — ABNORMAL HIGH
BUN: 16
Bicarbonate: 21.1
Chloride: 107
Chloride: 107
Glucose, Bld: 148 — ABNORMAL HIGH
HCT: 23 — ABNORMAL LOW
Hemoglobin: 7.8 — CL
Operator id: 113031
Potassium: 4.1
Sodium: 140
TCO2: 22
pCO2 arterial: 32 — ABNORMAL LOW
pCO2 arterial: 35.7
pH, Arterial: 7.392
pH, Arterial: 7.427

## 2010-10-11 LAB — I-STAT 8, (EC8 V) (CONVERTED LAB)
BUN: 13
Bicarbonate: 25.7 — ABNORMAL HIGH
HCT: 47
Hemoglobin: 16
Operator id: 272551
Sodium: 141
TCO2: 27

## 2010-10-11 LAB — ABO/RH: ABO/RH(D): O NEG

## 2010-10-11 LAB — PROTIME-INR
INR: 2.88 — ABNORMAL HIGH (ref 0.00–1.49)
INR: 1.2
INR: 1.4
INR: 1.4
INR: 2.1 — ABNORMAL HIGH
INR: 2.3 — ABNORMAL HIGH
Prothrombin Time: 30.6 seconds — ABNORMAL HIGH (ref 11.6–15.2)
Prothrombin Time: 15.4 — ABNORMAL HIGH
Prothrombin Time: 17.5 — ABNORMAL HIGH
Prothrombin Time: 22.6 — ABNORMAL HIGH
Prothrombin Time: 24.4 — ABNORMAL HIGH

## 2010-10-11 LAB — CREATININE, SERUM
Creatinine, Ser: 1.28
GFR calc Af Amer: >60
GFR calc non Af Amer: 55 — ABNORMAL LOW

## 2010-10-11 LAB — LIPID PANEL
Triglycerides: 91
VLDL: 18

## 2010-10-11 LAB — TSH: TSH: 2.753

## 2010-10-11 LAB — PLATELET COUNT: Platelets: 100 — ABNORMAL LOW

## 2010-10-11 LAB — DIFFERENTIAL
Eosinophils Relative: 1
Lymphocytes Relative: 12
Lymphs Abs: 1.4

## 2010-10-11 LAB — HEMOGLOBIN AND HEMATOCRIT, BLOOD: Hemoglobin: 8.1 — ABNORMAL LOW

## 2010-10-11 LAB — PRO B NATRIURETIC PEPTIDE: Pro B Natriuretic peptide (BNP): 7285 pg/mL — ABNORMAL HIGH (ref 0–450)

## 2010-10-11 LAB — TROPONIN I
Troponin I: 0.43 — ABNORMAL HIGH
Troponin I: 11.49

## 2010-10-11 LAB — POCT I-STAT GLUCOSE
Glucose, Bld: 118 — ABNORMAL HIGH
Operator id: 3342

## 2010-10-11 LAB — B-NATRIURETIC PEPTIDE (CONVERTED LAB): Pro B Natriuretic peptide (BNP): 417 — ABNORMAL HIGH

## 2010-10-11 LAB — HEMOGLOBIN A1C: Mean Plasma Glucose: 108

## 2010-10-11 LAB — PREPARE PLATELET PHERESIS

## 2010-10-11 LAB — POCT CARDIAC MARKERS: Troponin i, poc: 0.2 — ABNORMAL HIGH

## 2010-10-11 LAB — POCT I-STAT CREATININE: Operator id: 272551

## 2010-10-11 LAB — MAGNESIUM: Magnesium: 2.5

## 2010-10-12 LAB — BASIC METABOLIC PANEL
BUN: 17 mg/dL (ref 6–23)
CO2: 32 mEq/L (ref 19–32)
Calcium: 8.9 mg/dL (ref 8.4–10.5)
Chloride: 102 mEq/L (ref 96–112)
Creatinine, Ser: 1.09 mg/dL (ref 0.50–1.35)
GFR calc Af Amer: >60 mL/min (ref >60)
GFR calc non Af Amer: >60 mL/min (ref >60)
Glucose, Bld: 97 mg/dL (ref 70–99)
Potassium: 3.4 mEq/L — ABNORMAL LOW (ref 3.5–5.1)
Sodium: 143 mEq/L (ref 135–145)

## 2010-10-12 LAB — PROTIME-INR
INR: 2.29 — ABNORMAL HIGH (ref 0.00–1.49)
Prothrombin Time: 25.6 seconds — ABNORMAL HIGH (ref 11.6–15.2)

## 2010-10-12 LAB — CBC
HCT: 39.8 % (ref 39.0–52.0)
Hemoglobin: 13 g/dL (ref 13.0–17.0)
MCH: 29 pg (ref 26.0–34.0)
MCHC: 32.7 g/dL (ref 30.0–36.0)
MCV: 88.6 fL (ref 78.0–100.0)
Platelets: 243 10*3/uL (ref 150–400)
RBC: 4.49 MIL/uL (ref 4.22–5.81)
RDW: 13.6 % (ref 11.5–15.5)
WBC: 9.7 10*3/uL (ref 4.0–10.5)

## 2010-10-13 LAB — BASIC METABOLIC PANEL
BUN: 18 mg/dL (ref 6–23)
CO2: 30 mEq/L (ref 19–32)
Calcium: 8.9 mg/dL (ref 8.4–10.5)
Chloride: 103 mEq/L (ref 96–112)
Creatinine, Ser: 1.05 mg/dL (ref 0.50–1.35)
GFR calc Af Amer: >60 mL/min (ref >60)
GFR calc non Af Amer: >60 mL/min (ref >60)
Glucose, Bld: 97 mg/dL (ref 70–99)
Potassium: 3.9 mEq/L (ref 3.5–5.1)
Sodium: 141 mEq/L (ref 135–145)

## 2010-10-13 LAB — PROTIME-INR
INR: 2.19 — ABNORMAL HIGH (ref 0.00–1.49)
Prothrombin Time: 24.7 seconds — ABNORMAL HIGH (ref 11.6–15.2)

## 2010-10-14 LAB — PROTIME-INR
INR: 2.65 — ABNORMAL HIGH (ref 0.00–1.49)
Prothrombin Time: 28.7 seconds — ABNORMAL HIGH (ref 11.6–15.2)

## 2010-10-24 NOTE — Discharge Summary (Signed)
Trevor Webb, Trevor Webb NO.:  [id number]  MEDICAL RECORD NO.:  [id number]  LOCATION:  3702                         FACILITY:  [facility]  PHYSICIAN:  [name], M.D.    DATE OF BIRTH:  July 10, 1928  DATE OF ADMISSION:  10/08/2010 DATE OF DISCHARGE:  10/14/2010                              DISCHARGE SUMMARY   ADMITTING DIAGNOSIS:  Acute systolic heart failure.  DISCHARGE DIAGNOSES: 1. Acute systolic heart failure. 2. 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 3. Hypertension. 4. Atrial fibrillation, on chronic Coumadin. 5. Alzheimer disease. 6. Coronary artery disease, status post coronary artery bypass graft     in 2009.  HISTORY:  The patient is an 77 year old male who lives at [facility].  He has been there because of progressive dementing illness associated with stroke and right hemiparesis.  He had been doing well until the day or 2 prior to admission when he began to note shortness of breath.  He complained of some vague chest pain and was sent to the emergency department.  HOSPITAL COURSE:  The patient was admitted after initial chest x-ray, revealed evidence of interstitial edema and bilateral pleural effusions. A pro BNP at admission was 10,113.  On the second hospital day, he underwent echocardiography.  This revealed a global hypokinesis with an ejection fraction of 25-30%, which was distinctly different from the ejection fraction of 50% which was found in 2011.  He does have a past history of coronary artery disease, but there was no evidence at this admission of ischemia and the heart failure was a global in nature and there was no focal wall motion abnormality.  The etiology of this is unclear at this time, but he will be treated medically.  He was seen in consultation by Cardiology.  Medications were adjusted and he did well over the subsequent several days.  He was on some  of these medications prior to admission and doses were changed.  Some of these medications are new, but he remained in stable condition, is discharged in improved condition.  Because of several days of bedrest, the The patient was seen in consultation by Physical Therapy and was able to do some activities with them.  He will need continued physical therapy as he returns to [facility].  It is not clear to me whether occupational therapy would be necessary, but an evaluation would be appropriate.  DISCHARGE MEDICATIONS: 1. Carvedilol 12.5 mg b.i.d. 2. Furosemide 40 mg daily. 3. Isosorbide 15 mg daily. 4. Potassium chloride 20 mEq daily. 5. Ramipril 5 mg twice daily. 6. Spironolactone 25 mg daily. 7. Tamsulosin 0.4 mg daily. 8. Aspirin 81 mg daily. 9. Loratadine 10 mg daily. 10.Nitroglycerin 0.4 mg p.r.n. 11.Sertraline 25 mg daily. 12.Vitamin B12 1000 mcg IM monthly. 13.Warfarin as adjusted to have a protime between 2 and 3. 14.Coumadin doses have been became to 0.5 and 5 mg.  ACTIVITY:  Per PT and OT.  DIET:  No added salt.  It is unclear whether the patient will need further cardiology followup. Until the patient's wife that, this would be left up to the patient's attending to decide if further cardiologic  consultation is necessary.     [name], M.D.     JO/MEDQ  D:  10/14/2010  T:  10/14/2010  Job:  [id number]  Electronically Signed by [name] M.D. on 10/24/2010 01:40:19 PM

## 2010-10-31 NOTE — H&P (Signed)
NAMECHRISHAUN, SASSO NO.:  [id number]  MEDICAL RECORD NO.:  [id number]  LOCATION:                                 FACILITY:  PHYSICIAN:  Jeoffrey Massed, MD    DATE OF BIRTH:  [birth year]  DATE OF ADMISSION: DATE OF DISCHARGE:                             HISTORY & PHYSICAL   PRIMARY CARE PRACTITIONER:  [name], MD.  CHIEF COMPLAINT:  Chest pain and shortness of breath since this morning.  HISTORY OF PRESENT ILLNESS:  [name] is an 77 year old gentleman, who currently resides in a skilled nursing facility with a past medical history of CVA with some right-sided hemiparesis, atrial fibrillation on chronic Coumadin therapy, ongoing dementia, history of coronary artery disease status post CABG, comes in with the above-noted complaints. Please note that most of this history is actually obtained from the ED chart and talking to the patient's wife at bedside, the patient does have some amount of dementia and is not very helpful with the history. Per the history obtained apparently this morning, the patient complained of some vague chest pain.  When asked to describe further, the patient cannot elaborate and claims that he actually did not have chest pain. The patient also was noted to have some shortness of breath and again when asked to elaborate further, he is not able to.  Per the patient's wife, the patient did have a debilitating stroke last year and did have extensive right-sided hemiparesis, but now has regained some function of his right upper extremity.  However, he is still having problems with his right lower extremity and is ambulatory with help of a walker with assistance.  Otherwise, he is mostly bed bound.  As per the wife, she does not know if the patient has had any fever or cough.  There is no history of nausea, vomiting, or diarrhea as well.  ALLERGIES:  The patient does not have any significant allergies.  PAST MEDICAL  HISTORY: 1. CVA with right-sided hemiparesis. 2. Atrial fibrillation on chronic Coumadin therapy. 3. Hypertension. 4. Alzheimer dementia.  PAST SURGICAL HISTORY: 1. Coronary artery disease status post CABG in 2009. 2. Right hip replacement. 3. Status post knee replacement.  MEDICATIONS:  Prior to admission include the following; 1. Coreg 6.25 mg 1 tablet twice daily. 2. Altace 2.5 mg p.o. daily. 3. Nitroglycerin 0.4 sublingual tablet under the tongue every 5     minutes up to 3 doses p.r.n. 4. Vitamin B12 of 1000 mcg intramuscularly every month. 5. Hydroxyzine 10 mg 1 tablet at bedtime for itching. 6. Isosorbide mononitrate 15 mg p.o. twice daily. 7. Claritin 10 mg p.o. daily. 8. Flomax 0.4 mg p.o. at bedtime. 9. Senokot 2 tablets twice daily. 10.Coumadin 4.5 mg p.o. daily. 11.Zoloft 25 mg p.o. daily.  FAMILY HISTORY:  Noncontributory.  SOCIAL HISTORY:  The patient is currently a resident of a skilled nursing facility and there is no history of toxic habits.  REVIEW OF SYSTEMS:  A detailed review of 12 systems was done and these are negative except for the ones noted in the HPI.  PHYSICAL EXAMINATION:  GENERAL EXAM:  Lying in bed, does not appear to be in distress, seems to be  very pleasantly confused.  He is awake with somewhat clear, but slow speech. VITAL SIGNS:  Temperature of 98.1, pulse of 61, respiration 18, blood pressure 166/93, and pulse ox of 97% on room air. HEENT:  Atraumatic and normocephalic.  Pupils equally reactive to light and accommodation. NECK:  Supple. CHEST:  Decreased air entry bilaterally.  However, does appear to be some bibasilar rales as well.  Otherwise; the chest is clear. CARDIOVASCULAR:  Heart sounds are regular. ABDOMEN:  Soft, nontender, and nondistended. EXTREMITIES:  The patient does have chronic right lower extremity edema. Per the patient's wife, however, this edema is much worse than usual. The patient does have 1+ pitting edema on  the left lower extremity as well. NEUROLOGIC:  The patient is awake, but presently confused with slow, but clear speech.  He has 5/5 strength in his left upper and lower extremity.  He has around 3-4 strength on his right upper extremity and his right lower extremity strength is around 2-3/5.  LABORATORY DATA: 1. CBC done today shows WBC of 9.0, hemoglobin of 13.6, hematocrit of     40.6, and platelet count of 254. 2. INR is 2.64. 3. Two sets of troponin so far are negative. 4. Chemistries done today shows sodium of 143, potassium of 3.5,     chloride of 106, bicarb of 26, glucose of 92, BUN of 12, creatinine     of 0.90, and a calcium of 8.9. 5. Pro-BNP is 10,113.  RADIOLOGICAL STUDIES: 1. X-ray of the chest showed congestive heart failure with     interstitial edema and layering bilateral pleural effusions.     Bibasilar airspace disease. 2. Ultrasound of her right lower extremity shows no evidence of DVT in     the right leg.  ASSESSMENT: 1. Shortness of breath, likely multifactorial, but possibly due to     acute decompensation of diastolic heart failure and healthcare-     associated pneumonia as well. 2. Chest pain, likely atypical with current cardiac markers negative.  PLAN: 1. This patient will be admitted to telemetry unit. 2. In terms of his possible congestive heart failure, we will obtain a     2-D echo and put him on Lasix.  We will continue his Altace and     Coreg for now. 3. In terms of his possible pneumonia, we will go ahead and place     empirically on vancomycin and Zosyn and we will follow his clinical     course. 4. In terms of his chest pain, we will cycle cardiac enzymes and if     positive, cardiology will be consulted. 5. Further plan will depend as the patient's clinical course evolves. 6. Coumadin will be managed by the pharmacy. 7. Code status.  This was discussed with the patient's wife and the     patient is do not intubate and do not  resuscitate, which we will     honor.  TOTAL TIME SPENT:  For admission 45 minutes.     Jeoffrey Massed, MD     SG/MEDQ  D:  10/08/2010  T:  10/08/2010  Job:  [id number]  Electronically Signed by Jeoffrey Massed  on 10/31/2010 04:19:36 PM

## 2010-10-31 NOTE — ED Provider Notes (Signed)
History     CSN: [id number] Arrival date & time: 10/08/2010 11:44 AM  No chief complaint on file.   (Consider location/radiation/quality/duration/timing/severity/associated sxs/prior treatment) HPI Comments: History was somewhat limited by patient's history of dementia related to stroke.  Patient is a 77 y.o. male presenting with chest pain. The history is provided by the patient, the nursing home and the EMS personnel. No language interpreter was used.  Chest Pain The chest pain began 1 - 2 hours ago. Duration of episode(s) is 30 minutes. Chest pain occurs intermittently. The chest pain is resolved. Associated with: Nothing the patient knows of. At its most intense, the pain is at 7/10. The pain is currently at 0/10. The quality of the pain is described as sharp. The pain does not radiate. Exacerbated by: Unknown. Primary symptoms include shortness of breath and cough. Pertinent negatives for primary symptoms include no abdominal pain, no nausea, no vomiting and no altered mental status. He tried nitroglycerin for the symptoms. Risk factors include being elderly, male gender and sedentary lifestyle.  His past medical history is significant for arrhythmia, CAD, CHF, hyperlipidemia, hypertension and strokes.  His family medical history is significant for CAD in family and stroke in family.  Procedure history is positive for cardiac catheterization and echocardiogram.     No past medical history on file.  No past surgical history on file.  No family history on file.  History  Substance Use Topics  . Smoking status: Not on file  . Smokeless tobacco: Not on file  . Alcohol Use: Not on file      Review of Systems  Constitutional: Negative.   HENT: Negative.   Eyes: Negative.   Respiratory: Positive for cough and shortness of breath.   Cardiovascular: Positive for chest pain and leg swelling.  Gastrointestinal: Negative.  Negative for nausea, vomiting and abdominal pain.    Genitourinary: Negative.   Musculoskeletal: Negative.   Skin: Negative.   Neurological: Negative.   Hematological: Negative.   Psychiatric/Behavioral: Negative.  Negative for altered mental status.  All other systems reviewed and are negative.    Allergies  Review of patient's allergies indicates not on file.  Home Medications  No current outpatient prescriptions on file.  BP 162/102  Pulse 62  Temp(Src) 97.5 F (36.4 C) (Oral)  Resp 24  SpO2 98%  Physical Exam  Nursing note and vitals reviewed. Constitutional: He is oriented to person, place, and time. He appears well-developed and well-nourished. No distress.       Chronically ill-appearing  HENT:  Head: Normocephalic and atraumatic.  Eyes: Conjunctivae and EOM are normal. Pupils are equal, round, and reactive to light.  Neck: Normal range of motion.  Cardiovascular: Normal rate, regular rhythm, normal heart sounds and intact distal pulses.  Exam reveals no gallop and no friction rub.   No murmur heard. Pulmonary/Chest: Effort normal. No respiratory distress. He has no wheezes. He has rales.  Abdominal: Soft. Bowel sounds are normal. He exhibits no distension. There is no tenderness. There is no rebound and no guarding.  Musculoskeletal: Normal range of motion.  Neurological: He is alert and oriented to person, place, and time. No cranial nerve deficit. He exhibits normal muscle tone. Coordination normal.       Patient with right hemiplegia from old stroke  Skin: Skin is warm and dry. No rash noted.  Psychiatric: He has a normal mood and affect.    ED Course  Procedures  A total of 30 minutes was spent in critical  care of this patient. Time was spent in discussion with the patient and family as well as in reassessment of the patient's presentation. Additionally patient required transfer to outside facility. Time was spent in the documentation required accomplish this as well as in conversation with Dr. outlying  hospital.  Date: 10/08/2010  Rate: 86  Rhythm: atrial fibrillation and premature ventricular contractions (PVC)  QRS Axis: left  Intervals: normal  ST/T Wave abnormalities: nonspecific ST/T changes  Conduction Disutrbances:none  Narrative Interpretation: PVCs are new.  Old EKG Reviewed: changes noted  Labs Reviewed  PROTIME-INR - Abnormal; Notable for the following:    Prothrombin Time 28.6 (*)    INR 2.64 (*)    All other components within normal limits  APTT - Abnormal; Notable for the following:    aPTT 43 (*)    All other components within normal limits  PRO B NATRIURETIC PEPTIDE - Abnormal; Notable for the following:    BNP, POC 10113.0 (*)    All other components within normal limits  BASIC METABOLIC PANEL  CBC  DIFFERENTIAL  TROPONIN I  TROPONIN I   No results found.   1. Congestive heart failure, unspecified   2. Hospital-acquired pneumonia       MDM  Patient was chest pain-free by the time of our discussion. He was treated with aspirin as he had had pain prior to arrival. Patient was feeling much better but his blood pressure was still elevated. Patient had EKG which was remarkable only for new PVCs. CBC was within normal limits. Patient did have a therapeutic INR. History done and was negative. Chest x-ray did show congestive heart failure with interstitial edema and layering bilateral pleural effusions. There is also concern that the patient could have airspace disease on this. Patient had blood cultures drawn and was given a dose of vancomycin Zosyn as he is a nursing home resident and is at risk for healthcare acquired pneumonia. Patient's renal function was within normal limits. He did have lower surely swelling there is greater on the right compared to the left. He did have an ultrasound performed here which showed no evidence of blood clots. Patient also was given a dose of Lasix here and had nitro paste applied to his chest given the findings of heart failure.  Patient was transferred for healthcare acquired pneumonia as well as congestive heart failure. Patient was in stable condition the time of transfer and both patient and family were agreeable with plan to transfer for further management and admission.        Cyndra Numbers, MD 10/31/10 [phone]

## 2010-10-31 NOTE — Consult Note (Signed)
NAMEJERMARIO, KALMAR NO.:  [id number]  MEDICAL RECORD NO.:  [id number]  LOCATION:  3735                         FACILITY:  [facility]  PHYSICIAN:  Lyn Records, M.D.   DATE OF BIRTH:  1928/12/12  DATE OF CONSULTATION: DATE OF DISCHARGE:                                CONSULTATION   REASON FOR CONSULTATION:  Systolic heart failure.  CONCLUSIONS: 1. Acute systolic heart failure.     a.     LVEF 25-30% by echo done on October 09, 2010, which is      distinctly different than EF documented in 2011, which was greater      than 50%, etiology is uncertain.  Suspect ischemic versus other. 2. History of left brain cerebrovascular accident 2011.     a.     Persistent right hemiplegia.       b.     Speech disturbance 3. Significant dementia. 4. Gastroesophageal reflux. 5. Chronic atrial fibrillation. 6. Hypertension.  RECOMMENDATIONS: 1. Continue and optimize the Coreg and Altace doses as tolerated by     blood pressure and renal function. 2. Add a daily oral Lasix dose to prevent recurrent acute heart     failure. 3. Consider low-dose Aldactone therapy if renal function and potassium     tolerates. 4. No specific ischemic evaluation or other cardiac workup seems     indicated at this time.  COMMENTS:  The patient is an 77 year old resident of a skilled nursing facility who has complex comorbidities including his age of 77, chronic atrial fibrillation, chronic Coumadin therapy, severe coronary artery disease status post coronary artery bypass surgery in 2009, when he presented with left main disease, significant dementia, and a left brain CVA in 2011.  He presented on this occasion with onset of chest pain and severe dyspnea.  On chest x-ray, bilateral airspace disease was noted and IV diuresis was started.  There was significant improvement in dyspnea and chest discomfort with diuresis.  A 2-D echocardiography was performed and demonstrated significant  reduction in LVEF compared to the most recent study done in 2011 at the time of his stroke.  The patient is unable to give me very much useful history.  His current wife however tells me he has been very stable at the nursing home and the day before all this happened, he had no complaints and was not having shortness of breath.  This all occurred relatively acutely in the early morning hours between 5 and 7 a.m. on October 08, 2010.  He is currently pain-free and having no respiratory distress.  On admission, his medication regimen was notable for the absence of a diuretic.  The medication regimen includes Coreg 6.25 mg twice daily, Altace 2.5 mg per day, aspirin 0.4 mg sublingually every 5 minutes x3 doses, vitamin B12 1000 mcg IM monthly, hydroxyzine 10 mg daily, isosorbide mononitrate 15 mg twice a day, Claritin 10 mg per day, Flomax 0.4 mg at bedtime, Senokot 2 tablets twice daily, Coumadin 4.5 mg daily, Zoloft 25 mg daily.  OTHER SIGNIFICANT CONTRIBUTORY DATA:  Allergies none.  PHYSICAL EXAMINATION:  GENERAL:  The patient is lying comfortably in his bed.  His daughter and wife were  present. CARDIAC:  No obvious gallop, no murmur.  There is no chest tenderness. NECK:  Neck veins are not distended. LUNGS:  Basilar rales. EXTREMITIES:  Right greater than left edema. NEURO:  The patient who is awake but having difficulty answering coherently any question that we ask.  He has speech impairment secondary to a prior stroke and we also note that he has a history of dementia.  IMAGING:  Chest x-ray on admission demonstrated interstitial edema and layering bilateral pleural effusions.  CT of the head demonstrated chronic ischemic changes and atrophy.  No intracranial pathology was noted.  Repeat chest x-ray on October 09, 2010, reveals improvement in airspace disease.  LABORATORY DATA:  Demonstrates creatinine of 1.38 after diuresis, on admission it was 0.9, potassium is 3.5.  BNP on  admission was 10,113, today 10,575.  INR 4.0.  Hemoglobin 12.5.  DISCUSSION:  The patient has had prior significant left ventricular dysfunction with EF documented to be less than 40% prior to bypass surgery.  After bypass surgery in 2009, LVEF increased to greater than 50%.  He now presents with depressed LV function and heart failure with bilateral pleural effusions.  We simply need to add diuretic therapy to rid the patient of congestion and to the extent possible optimize his heart failure therapy perhaps as an outpatient.  I will go ahead today and convert the Lasix to 40 mg per day and add 12.5 mg of Aldactone.  We must follow his renal function closely.     Lyn Records, M.D.     HWS/MEDQ  D:  10/10/2010  T:  10/10/2010  Job:  [id number]  Electronically Signed by Verdis Prime M.D. on 10/31/2010 11:02:50 AM

## 2010-11-12 ENCOUNTER — Inpatient Hospital Stay (HOSPITAL_COMMUNITY)
Admission: EM | Admit: 2010-11-12 | Discharge: 2010-11-14 | DRG: 683 | Disposition: A | Discharge status: Skilled Nursing Facility | Payer: Medicare Other | Attending: Internal Medicine | Admitting: Internal Medicine

## 2010-11-12 ENCOUNTER — Emergency Department (HOSPITAL_COMMUNITY): Payer: Medicare Other

## 2010-11-12 ENCOUNTER — Encounter: Payer: Self-pay | Admitting: Internal Medicine

## 2010-11-12 DIAGNOSIS — N179 Acute kidney failure, unspecified: Principal | ICD-10-CM | POA: Diagnosis present

## 2010-11-12 DIAGNOSIS — I4891 Unspecified atrial fibrillation: Secondary | ICD-10-CM | POA: Diagnosis present

## 2010-11-12 DIAGNOSIS — G309 Alzheimer's disease, unspecified: Secondary | ICD-10-CM | POA: Diagnosis present

## 2010-11-12 DIAGNOSIS — F0280 Dementia in other diseases classified elsewhere without behavioral disturbance: Secondary | ICD-10-CM | POA: Diagnosis present

## 2010-11-12 DIAGNOSIS — Z8673 Personal history of transient ischemic attack (TIA), and cerebral infarction without residual deficits: Secondary | ICD-10-CM

## 2010-11-12 DIAGNOSIS — I10 Essential (primary) hypertension: Secondary | ICD-10-CM | POA: Diagnosis present

## 2010-11-12 DIAGNOSIS — Z96659 Presence of unspecified artificial knee joint: Secondary | ICD-10-CM

## 2010-11-12 DIAGNOSIS — R5381 Other malaise: Secondary | ICD-10-CM | POA: Diagnosis present

## 2010-11-12 DIAGNOSIS — R0789 Other chest pain: Secondary | ICD-10-CM | POA: Diagnosis present

## 2010-11-12 DIAGNOSIS — I959 Hypotension, unspecified: Secondary | ICD-10-CM | POA: Diagnosis present

## 2010-11-12 DIAGNOSIS — I509 Heart failure, unspecified: Secondary | ICD-10-CM | POA: Diagnosis present

## 2010-11-12 DIAGNOSIS — G8190 Hemiplegia, unspecified affecting unspecified side: Secondary | ICD-10-CM | POA: Diagnosis present

## 2010-11-12 LAB — BASIC METABOLIC PANEL
CO2: 23 mEq/L (ref 19–32)
Calcium: 8.8 mg/dL (ref 8.4–10.5)
Creatinine, Ser: 1.8 mg/dL — ABNORMAL HIGH (ref 0.50–1.35)
GFR calc Af Amer: 39 mL/min — ABNORMAL LOW (ref >90)

## 2010-11-12 LAB — POCT I-STAT, CHEM 8
Glucose, Bld: 100 mg/dL — ABNORMAL HIGH (ref 70–99)
HCT: 41 % (ref 39.0–52.0)
Hemoglobin: 13.9 g/dL (ref 13.0–17.0)
Potassium: 4.6 mEq/L (ref 3.5–5.1)

## 2010-11-12 LAB — DIFFERENTIAL
Basophils Absolute: 0 10*3/uL (ref 0.0–0.1)
Eosinophils Absolute: 0.1 10*3/uL (ref 0.0–0.7)
Eosinophils Relative: 1 % (ref 0–5)
Lymphocytes Relative: 20 % (ref 12–46)
Neutrophils Relative %: 69 % (ref 43–77)

## 2010-11-12 LAB — CARDIAC PANEL(CRET KIN+CKTOT+MB+TROPI)
CK, MB: 2.2 ng/mL (ref 0.3–4.0)
Relative Index: INVALID (ref 0.0–2.5)
Total CK: 30 U/L (ref 7–232)
Troponin I: <0.3 ng/mL (ref <0.30)

## 2010-11-12 LAB — POCT I-STAT TROPONIN I

## 2010-11-12 LAB — CBC
HCT: 39.7 % (ref 39.0–52.0)
Platelets: 175 10*3/uL (ref 150–400)
RDW: 13.6 % (ref 11.5–15.5)
WBC: 6.8 10*3/uL (ref 4.0–10.5)

## 2010-11-12 LAB — URINALYSIS, ROUTINE W REFLEX MICROSCOPIC
Bilirubin Urine: NEGATIVE
Ketones, ur: NEGATIVE mg/dL
Protein, ur: NEGATIVE mg/dL
Urobilinogen, UA: 0.2 mg/dL (ref 0.0–1.0)

## 2010-11-12 LAB — PROTIME-INR: Prothrombin Time: 29.7 seconds — ABNORMAL HIGH (ref 11.6–15.2)

## 2010-11-12 LAB — URINE MICROSCOPIC-ADD ON

## 2010-11-12 NOTE — H&P (Signed)
Hospital Admission Note Date: 11/12/2010  Patient name: Trevor Webb Medical record number: [id number] Date of birth: 1928-10-10 Age: 78 y.o. Gender: male PCP: [name], MD  Medical Service: Internal Medicine  Attending physician:  Dr. Josem Kaufmann    Pager: Resident (R2/R3):  Elyse Jarvis, MD  Pager: [phone] Resident (R1): Junious Dresser, MD  Pager: [phone]  Chief Complaint: Chest Pain  History of Present Illness: Patient is a 77 yo male, nursing home resident  with past medical history of CHF, Afib,Alzheimer's who is brought to the ED for chest pain like a pressure at 7 am today, of unknown duration, intensity and irradiation, reported from the nures of the nursing home. Patient is a poor historian from dementia and the history was mostly obtained from the nursing home and family who was present at the bedside.He also presented with low blood pressure, 90/56 mmHg in nursing home and at admision.  As per the report,  he had an episode of stomach pain and vomiting during physical therapy at the nursing home a day prior to his admission, The vomitus has been described as non bloody and non bilious. He denies any SOB, diarrhea, fever, chills , bladder or bowel complaints.    Past Medical History: - CHF, EF 25-30% on September 2012. On tx with diuretics.   - Atrial Fibrillation on chronic coumadin therapy.  - Stroke in April 2011 with right side hemiplegia.  - HTN controled with antihypertensive medication.             - GERD             - Cellulitis involving right upper thigh in 01/11   Past Surgical History: - Multiples knee surgery, and bilateral knee replacement  - Hip fracture 7 yr ago tx with hip replacment  - Bowel resection for diverticulitis complicated with ostomy, which is corrected.  - CABG 4 yr ago in 2007-06-23  Family History: Father died from MI  - Mother died from Stroke  - Brother with PMH of heart transplant, other died from MI, other had lung cancer and stroke.    Social History: He lives in a nursing home facility since 1 1/2 years ago because of his Alzheimer dementia and residual right hemiplegia after stroke. He is newly married, has a son. He finished school, was very physically active before the stoke.   MEDICATIONS:  1. Carvedilol 12.5 mg b.i.d.   2. Furosemide 40 mg daily.   3. Isosorbide 15 mg daily.   4. Potassium chloride 20 mEq daily.   5. Ramipril 5 mg twice daily.   6. Spironolactone 25 mg daily.   7. Tamsulosin 0.4 mg daily.   8. Aspirin 81 mg daily.   9. Loratadine 10 mg daily.   10.Nitroglycerin 0.4 mg p.r.n.   11.Sertraline 25 mg daily.   12.Vitamin B12 1000 mcg IM monthly.   13.Warfarin as adjusted to have a protime between 2 and 3.   14.Coumadin doses have been became to 0.5 and 5 mg.   ALLERGIES: morphine- causes delusions  ROS: General: Denies fever, chills, weight change, weakness, fatigue, night sweats, appetite change Neuro: Denies headache, seizures. HEENT: Denies head trauma, ear pain/dischge, vertigo, hearing loss Hematologic: Denies anemia, easy bruising Respiratory: Denies cough, SOB, wheezing, hemoptysis Cardiac:, palpitations GI: Denies nausea, constipation or diarrhea Endocrine: Denies heat/cold intolerance, polydipsia, olyguria Urinary: Denies dysuria, urgency, frequency or hematuria Extremities: Denies swelling.  Skin: Denies rashes, hair or nail changes Psych: memory loss do his alzheimer.  Physical Exam:  T: 97.3 , BP- 90/56> 120/51 , HR-62 irregular, R-19, O2 sats-100 % on RA   General: Patient in no acute distress, cooperative but not reliable historian.  HEENT: [place]/AT, PEERLA, EOMI, pink conjunctiva, no mucosal lesions but dry Neck: Supple, no JVD, no LAD, no masses Heart:  irregular rhythm, distant heart sounds,no murmurs, gallops or rubs Lungs: Clear BS bilaterally, no wheezing, rales or rubs. Abdomen: soft, no tender, no distended,  BS +, no palpable masses, no organomegaly  appreciated. Neurologic: Dementia, right hemiplegia. SKin: Skin tenting + Extremities: 1+ palpable pulses bilaterally. No edema.    Lab results: Troponin I  0.02     TCO2                                     21            0-100            mmol/L  Ionized Calcium                         1.14              1.12-1.32        mmol/L  Hemoglobin (HGB)                          13.9              13.0-17.0        g/dL  Hematocrit (HCT)                          41.0              39.0-52.0        %  Sodium (NA)                               139               135-145          mEq/L  Potassium (K)                             4.6               3.5-5.1          mEq/L  Chloride                                  109               96-112           mEq/L  Glucose                                   100        h      70-99            mg/dL  BUN  44         h      6-23             mg/dL  Creatinine                                2.00       h      0.50-1.35        Mg/dL   WBC                                       6.8               4.0-10.5         K/uL  RBC                                       4.48              4.22-5.81        MIL/uL  Hemoglobin (HGB)                          13.0              13.0-17.0        g/dL  Hematocrit (HCT)                          39.7              39.0-52.0        %  MCV                                       88.6              78.0-100.0       fL  MCH -                                     29.0              26.0-34.0        pg  MCHC                                      32.7              30.0-36.0        g/dL  RDW                                       13.6              11.5-15.5        %  Platelet Count (PLT)                      175  150-400          K/uL  Neutrophils, %                            69                43-77            %  Lymphocytes, %                            20                12-46            %  Monocytes, %                               10                3-12             %  Eosinophils, %                            1                 0-5              %  Basophils, %                              0                 0-1              %  Neutrophils, Absolute                     4.7               1.7-7.7          K/uL  Lymphocytes, Absolute                     1.3               0.7-4.0          K/uL  Monocytes, Absolute                       0.7               0.1-1.0          K/uL  Eosinophils, Absolute                     0.1               0.0-0.7          K/uL  Basophils, Absolute                       0.0               0.0-0.1          K/uL    Protime ( Prothrombin Time)              29.7       h  11.6-15.2        seconds  INR                                       2.77       h      0.00-1.49  Color, Urine                             YELLOW              Appearance                              CLEAR             Specific Gravity                         1.009               pH                                      5.0                 Urine Glucose                          NEGATIVE            Bilirubin                              NEGATIVE           Ketones                                NEGATIVE                  Blood                                  MODERATE     Protein                                NEGATIVE                   Urobilinogen                           0.2                Nitrite                                NEGATIVE            Leukocytes                             NEGATIVE            Imaging results:  Chest X-  ray: Findings: Cardiomegaly.  Median sternotomy.  CABG.  No airspace disease.  No effusion.  Compared to 10/09/2010, aeration has improved.  Persistent tortuosity of the thoracic aorta. IMPRESSION:Cardiomegaly without failure.  Postoperative changes of CABG.  Other results: EKG: - Rate: 80 bpm  - Rhythm: irregular, A fib.  - Axis: -45   - Pr: A. fib,QSR: , QT: ,  - P  waves: A. Fib  - ST segment: normal , T waves: ? inversion in V1-V3/ PVC's compared to EKG from September 22th.  Assessment & Plan by Problem:  1.Chest Pain: Patient with CHF, A fib. and CABG who complains of chest pain with change in his EKG compared to last admission. He is a poor historian because of his dementia, family don't live with him and nurses from nursing home could not provided an accuated history of his present illness. For this reason we will admit the patient to follow up his cardiac enzymes, see for any new episode of chest pain and other changes in his EKG, he will be on continuos telemetry monitor.  He is probably presenting with an unstable angina, since it is new-onset, at the moment with negative cardiac enzymes and ? T waves inversion in V1 to V3. He has an intermediate risk according the ACC/AHA 2002 guideline for unstable/NSTEMI, because of his prior CABG, rest angina that is now resolved, over 76 yo and EKG changes. He has a TIMI risk score of 13%.  2. Hypotension: He arrives to the ED with low BP which was resolved after a 500cc bolus. We are holding his diuretics, ACE inhibitor due to  volume depletion, low BP and Cr of 2 ml/dl.  He will still taking carvedilol for his CHF, low EF and would also control his BP. We will keep a close eye on his BP and evaluation the need on re-ading his blood pressure medication or diuretics in case it gets elevated or volume overloaded.    3. Acute renal failure: On last admision a month ago he had a Cr of 1.40ml/dl that increase to 2 ml/dl today, which can be a result of his new diuretic administration since last admision, low perfusion  Result of his CHF, fluid restriction do to his CHF in nursing home , if it is prerenal.  He does not have any risk factor for  intrinsic or postrenal failure. - check FeNa - Hold lasix, aldactone and ramipril. - will try to get his records from nursing facility to get a BMET after he was stared on  lasix.  4. Systolic CHF: Stable. No signs of volume overload on exam. Continue coreg, ASA. Holding ACE inhibitor  5. Atrial fibrillation: continue coumadin dosed per pharmacy.  6. Dementia/ depression: continue to monitor.  - continue sertraline  7. DVT: Lovenox  Wynn Maudlin,      Visiting medical student [phone] - [phone]   ,R2/3___Megha Larri Brewton  [phone]___________________________        ATTENDING: I performed and/or observed a history and physical examination of the patient.  I discussed the case with the residents as noted and reviewed the residents' notes.  I agree with the findings and plan--please refer to the attending physician note for more details.  Signature________________________________  Printed Name_____________________________

## 2010-11-13 DIAGNOSIS — R079 Chest pain, unspecified: Secondary | ICD-10-CM

## 2010-11-13 LAB — CARDIAC PANEL(CRET KIN+CKTOT+MB+TROPI)
CK, MB: 1.8 ng/mL (ref 0.3–4.0)
Relative Index: INVALID (ref 0.0–2.5)
Total CK: 24 U/L (ref 7–232)
Troponin I: <0.3 ng/mL (ref <0.30)

## 2010-11-13 LAB — BASIC METABOLIC PANEL
CO2: 24 mEq/L (ref 19–32)
Chloride: 104 mEq/L (ref 96–112)
Creatinine, Ser: 1.69 mg/dL — ABNORMAL HIGH (ref 0.50–1.35)
GFR calc Af Amer: 42 mL/min — ABNORMAL LOW (ref >90)
Potassium: 4.3 mEq/L (ref 3.5–5.1)
Sodium: 139 mEq/L (ref 135–145)

## 2010-11-13 LAB — PROTIME-INR
INR: 3.27 — ABNORMAL HIGH (ref 0.00–1.49)
Prothrombin Time: 33.8 seconds — ABNORMAL HIGH (ref 11.6–15.2)

## 2010-11-13 LAB — TSH: TSH: 1.599 u[IU]/mL (ref 0.350–4.500)

## 2010-11-14 LAB — BASIC METABOLIC PANEL
BUN: 39 mg/dL — ABNORMAL HIGH (ref 6–23)
CO2: 27 mEq/L (ref 19–32)
Chloride: 104 mEq/L (ref 96–112)
Creatinine, Ser: 1.57 mg/dL — ABNORMAL HIGH (ref 0.50–1.35)
Glucose, Bld: 89 mg/dL (ref 70–99)

## 2010-11-14 LAB — PROTIME-INR: INR: 2.88 — ABNORMAL HIGH (ref 0.00–1.49)

## 2010-11-14 NOTE — Discharge Summary (Signed)
NAMEELISA, Webb NO.:  [id number]  MEDICAL RECORD NO.:  [id number]  LOCATION:  3704                         FACILITY:  [facility]  PHYSICIAN:  Doneen Poisson, MD     DATE OF BIRTH:  August 01, 1928  DATE OF ADMISSION:  11/12/2010 DATE OF DISCHARGE:  11/14/2010                              DISCHARGE SUMMARY   ATTENDING PHYSICIAN:  Doneen Poisson, MD.  DISCHARGE DIAGNOSES: 1. Acute failure. 2. Chronic congestive heart failure. 3. Hypertension. 4. Atrial fibrillation. 5. Alzheimer's. 6. Deconditioning.  DISCHARGE MEDICATIONS:  The patient is to take no new medications.  The following medications were changed, 1. Furosemide 40 mg by mouth daily as needed.  The patient is to only     take furosemide on days when he weighs greater than 3 pounds more     than his baseline weight, which is to be established on November 14, 2010, at his skilled versus nursing facility. 2. Potassium K-Dur tablets 20 mEq.  He is to take 20 mEq by mouth as     needed only on days when he takes for furosemide.  The following medications are continued, 1. Aspirin 81 mg tablets by mouth daily. 2. Donepezil 5 mg 1 tab by mouth daily at bedtime. 3. Carvedilol 12.5 mg tablets 1 tab by mouth twice daily with meals. 4. Isosorbide dinitrate 10 mg, he is to take one and a half tablets or     15 mg by mouth daily. 5. Loratadine 10 mg 1 tab by mouth every morning. 6. Nitroglycerin sublingual 0.4 mg 1 tab by mouth as needed for chest     pain.  He is to take 1 tab every 5 minutes as needed for up to 3     doses of chest pain. 7. Ramipril 5 mg tablets 1 capsule by mouth twice daily. 8. Sertraline 25 mg 1 tab by mouth every morning. 9. Tamsulosin 0.4 mg.  He is to take 0.4 mg by mouth daily. 10.Aspirin 81 mg enteric coated.  He is to take 1 tablets by mouth     daily. 11.Vitamin B12.  He is to take 1000 mcg intramuscularly monthly. 12.Warfarin.  The patient is to take 5.5 mg on Monday,  Wednesday, and     Friday.  He is to take 5 mg on Tuesday, Thursday, Saturday, and     Sunday.  He is to stop taking the following medications, 1. Furosemide daily.  He is to only take this as needed for new weight     gain. 2. Potassium daily.  He is only to take K-Dur on days when he takes     potassium from now on. 3. He is to stop taking spironolactone.  DISPOSITION AND FOLLOWUP:  Trevor Webb was discharged from [facility] on November 14, 2010, in stable and improved condition.  His renal function was improving and creatinine was following.  He had no episodes of chest pain during his admission and felt well on the date of discharge.  He is to follow up with Dr. Virginia Rochester at [facility] and the following items should be addressed: 1. Acute renal failure.  Trevor Webb should  have a Chem-7 drawn in     the next and 3-5 days to ensure return to baseline renal function.     He should also be weighed as soon as he arrives at the skilled     nursing facility.  This weight is to be his new baseline weight.     He is to be weighed in the same manner on the same scale every day.     If he gains more than 3 pounds from this baseline weight to be     obtained on November 14, 2010, he is to be given 1 dose of     furosemide 40 mg and then weighed again the next day.  He is to be     given Lasix only on days when he weighs more than 3 pounds greater     than his baseline established on November 14, 2010. 2. Trevor Webb will need his INR monitored.  This is to be performed     at the skilled nursing facility.  His Coumadin dose should be     adjusted accordingly.  PROCEDURES PERFORMED:  A 2-view chest radiograph dated November 12, 2010, significant for cardiomegaly without failure.  Postoperative changes of coronary artery bypass grafting are present.  CONSULTATIONS:  None.  BRIEF ADMITTING HISTORY AND PHYSICAL:    Trevor Webb is a 77 yo male, nursing home  resident  with past medical history of CHF, Afib,Alzheimer's who is brought to the ED for chest pain like a pressure at 7 am today, of unknown duration, intensity and irradiation, reported from the nures of the  nursing home. Patient is a poor historian from dementia and the history was mostly obtained from the nursing home and family who was present at the bedside.He also presented with low blood pressure, 90/56 mmHg in nursing home and at admision.   As per the report,  he had an episode of stomach pain and vomiting during physical therapy at the nursing home a day prior to his admission, The vomitus has been described as non bloody and non bilious. He denies any SOB, diarrhea, fever, chills ,  bladder or bowel complaints. T: 97.3 , BP- 90/56> 120/51 , HR-62 irregular, R-19, O2 sats-100 % on RA General: Patient in no acute distress, cooperative but not reliable historian.  HEENT: [place]/AT, PEERLA, EOMI, pink conjunctiva, no mucosal lesions but dry Neck: Supple, no JVD, no LAD, no masses Heart:  irregular rhythm, distant heart sounds,no murmurs, gallops or rubs Lungs: Clear BS bilaterally, no wheezing, rales or rubs. Abdomen: soft, no tender, no distended,  BS +, no palpable masses, no organomegaly appreciated. Neurologic: Dementia, right hemiplegia. SKin: Skin tenting + Extremities: 1+ palpable pulses bilaterally. No edema.    Lab results: Troponin I      0.02   TCO2                                      21                  0-100            mmol/L  Ionized Calcium                           1.14              1.12-1.32  mmol/L  Hemoglobin (HGB)                          13.9              13.0-17.0        g/dL  Hematocrit (HCT)                          41.0              39.0-52.0        %  Sodium (NA)                               139               135-145          mEq/L  Potassium (K)                             4.6               3.5-5.1          mEq/L  Chloride                                   109               96-112           mEq/L  Glucose                                   100        h      70-99            mg/dL  BUN                                       44         h      6-23             mg/dL  Creatinine                                2.00       h      0.50-1.35        Mg/dL    WBC                                       6.8               4.0-10.5         K/uL  RBC                                       4.48              4.22-5.81        MIL/uL  Hemoglobin (HGB)                          13.0              13.0-17.0        g/dL  Hematocrit (HCT)                          39.7              39.0-52.0        %  MCV                                       88.6              78.0-100.0       fL  MCH -                                     29.0              26.0-34.0        pg  MCHC                                      32.7              30.0-36.0        g/dL  RDW                                       13.6              11.5-15.5        %  Platelet Count (PLT)                      175               150-400          K/uL   Protime ( Prothrombin Time)              29.7       h      11.6-15.2        seconds  INR                                       2.77       h      0.00-1.49   Color, Urine                             YELLOW               Appearance                              CLEAR              Specific Gravity  1.009                pH                                      5.0                 Urine Glucose                          NEGATIVE             Bilirubin                              NEGATIVE            Ketones                                NEGATIVE                   Blood                                  MODERATE      Protein                                NEGATIVE                   Urobilinogen                           0.2                 Nitrite                                NEGATIVE             Leukocytes                             NEGATIVE       HOSPITAL COURSE BY PROBLEM: 1. Chest pain.  Mr. Dolman's initial complaint at his skilled     nursing facility was that of chest pain.  He had no recurrence of     chest pain during his hospitalization or time at [facility].  He     had 3 successive sets of cardiac markers that were negative for     myocardial infarction.  His EKG showed no acute changes from the     baseline EKG to indicate myocardial ischemia. 2. Acute renal failure.  Mr. Boike was admitted with hypotension     and acute renal failure secondary to over-diuresis.  He was volume     resuscitated gently with normal saline to prevent potential volume     overload, given his congestive heart failure.  This is as well as     holding any diuretics as well as initial holding of ACE inhibitor     on admission yielded a gradual decrease in creatinine as well as     improvement in mental  status.  Ramipril was added back on November 13, 2010, without an increase in creatinine or decrease in renal     function.  Mr. Merryfield was weighed daily and his weight on the     date of discharge was 78.6 kg; however, he is to be waited as soon     as he arrives at [facility].  The weight     that they obtain today on November 14, 2010, should be considered     his new baseline weight.  He is only to be given Lasix if he weighs     more than 3 pounds above his baseline weight.  One dose of 20 mEq     of K-Dur should be given on days when he receives furosemide. 3. Congestive heart failure.  Mr. Gittleman was maintained on nitrate     and carvedilol during his admission.  He had no symptoms of     congestive heart failure as he was in fact volume depleted during     this admission. 4. Hypotension.  Mr. Kraeger was initially admitted with systolic     blood pressures in the 80s.  Volume resuscitation with normal     saline increased blood pressures to be 110s over 70s.  He was     maintained off diuretics  during this period without blood pressures     becoming overly elevated. 5. Atrial fibrillation.  Mr. Tingler was maintained on Coumadin per     our hospital pharmacy.  His INR was therapeutic. 6. Alzheimer's.  This problem was stable during Mr. Glascoe's     admission, he was maintained on his home dose of Aricept. 7. Deconditioning.  Physical Therapy evaluated Mr. Rondon as he was     unable to get of bed for the first day and a half of his admission.     Their recommendation was to continue physical therapy at his     skilled nursing facility as previously ordered.  DISCHARGE VITALS:  Temperature 97.4, pulse 59, blood pressure 114/75, respirations 16, and O2 saturation 97% on room air, and.  DISCHARGE LABS:  Sodium 139, potassium 4.6, chloride 104, bicarbonate 27, glucose 89, BUN 39, creatinine 1.57, and calcium 9.2.  INR 2.88. Urine culture, no growth.  Cardiac enzymes were negative x3.  TSH was normal at 1.599.  Urinalysis was clean.  There are no pending lab tests.    ______________________________ Quentin Ore, MD   ______________________________ Doneen Poisson, MD    MR/MEDQ  D:  11/14/2010  T:  11/14/2010  Job:  [id number]  cc:   Royanne Foots, MD Dr. Earl Gala  Electronically Signed by Quentin Ore MD on 11/14/2010 06:41:56 PM

## 2011-03-17 ENCOUNTER — Emergency Department (HOSPITAL_COMMUNITY): Payer: Medicare Other

## 2011-03-17 ENCOUNTER — Encounter (HOSPITAL_COMMUNITY): Payer: Self-pay | Admitting: Emergency Medicine

## 2011-03-17 ENCOUNTER — Other Ambulatory Visit: Payer: Self-pay

## 2011-03-17 ENCOUNTER — Inpatient Hospital Stay (HOSPITAL_COMMUNITY)
Admission: EM | Admit: 2011-03-17 | Discharge: 2011-03-19 | DRG: 292 | Disposition: A | Discharge status: Skilled Nursing Facility | Payer: Medicare Other | Attending: Internal Medicine | Admitting: Internal Medicine

## 2011-03-17 DIAGNOSIS — I10 Essential (primary) hypertension: Secondary | ICD-10-CM | POA: Diagnosis present

## 2011-03-17 DIAGNOSIS — I5023 Acute on chronic systolic (congestive) heart failure: Principal | ICD-10-CM | POA: Diagnosis present

## 2011-03-17 DIAGNOSIS — I2589 Other forms of chronic ischemic heart disease: Secondary | ICD-10-CM | POA: Diagnosis present

## 2011-03-17 DIAGNOSIS — I69959 Hemiplegia and hemiparesis following unspecified cerebrovascular disease affecting unspecified side: Secondary | ICD-10-CM

## 2011-03-17 DIAGNOSIS — Z96659 Presence of unspecified artificial knee joint: Secondary | ICD-10-CM

## 2011-03-17 DIAGNOSIS — I4891 Unspecified atrial fibrillation: Secondary | ICD-10-CM | POA: Diagnosis present

## 2011-03-17 DIAGNOSIS — I2510 Atherosclerotic heart disease of native coronary artery without angina pectoris: Secondary | ICD-10-CM | POA: Diagnosis present

## 2011-03-17 DIAGNOSIS — I509 Heart failure, unspecified: Secondary | ICD-10-CM | POA: Diagnosis present

## 2011-03-17 DIAGNOSIS — F0280 Dementia in other diseases classified elsewhere without behavioral disturbance: Secondary | ICD-10-CM | POA: Diagnosis present

## 2011-03-17 DIAGNOSIS — Z96649 Presence of unspecified artificial hip joint: Secondary | ICD-10-CM

## 2011-03-17 DIAGNOSIS — R55 Syncope and collapse: Secondary | ICD-10-CM

## 2011-03-17 DIAGNOSIS — Z79899 Other long term (current) drug therapy: Secondary | ICD-10-CM

## 2011-03-17 DIAGNOSIS — E876 Hypokalemia: Secondary | ICD-10-CM

## 2011-03-17 DIAGNOSIS — G309 Alzheimer's disease, unspecified: Secondary | ICD-10-CM | POA: Diagnosis present

## 2011-03-17 DIAGNOSIS — Z7901 Long term (current) use of anticoagulants: Secondary | ICD-10-CM

## 2011-03-17 DIAGNOSIS — Z951 Presence of aortocoronary bypass graft: Secondary | ICD-10-CM

## 2011-03-17 DIAGNOSIS — Z7982 Long term (current) use of aspirin: Secondary | ICD-10-CM

## 2011-03-17 HISTORY — DX: Angina pectoris, unspecified: I20.9

## 2011-03-17 HISTORY — DX: Paroxysmal atrial fibrillation: I48.0

## 2011-03-17 HISTORY — DX: Heart failure, unspecified: I50.9

## 2011-03-17 HISTORY — DX: Essential (primary) hypertension: I10

## 2011-03-17 HISTORY — DX: Unspecified dementia, unspecified severity, without behavioral disturbance, psychotic disturbance, mood disturbance, and anxiety: F03.90

## 2011-03-17 HISTORY — DX: Acute myocardial infarction, unspecified: I21.9

## 2011-03-17 HISTORY — DX: Unspecified osteoarthritis, unspecified site: M19.90

## 2011-03-17 HISTORY — DX: Cerebral infarction, unspecified: I63.9

## 2011-03-17 LAB — DIFFERENTIAL
Basophils Absolute: 0 10*3/uL (ref 0.0–0.1)
Basophils Relative: 0 % (ref 0–1)
Eosinophils Absolute: 0 10*3/uL (ref 0.0–0.7)
Eosinophils Relative: 0 % (ref 0–5)
Lymphocytes Relative: 8 % — ABNORMAL LOW (ref 12–46)
Lymphs Abs: 0.8 10*3/uL (ref 0.7–4.0)
Monocytes Absolute: 0.8 10*3/uL (ref 0.1–1.0)
Monocytes Relative: 8 % (ref 3–12)
Neutro Abs: 7.6 10*3/uL (ref 1.7–7.7)
Neutrophils Relative %: 83 % — ABNORMAL HIGH (ref 43–77)

## 2011-03-17 LAB — COMPREHENSIVE METABOLIC PANEL
ALT: 16 U/L (ref 0–53)
AST: 9 U/L (ref 0–37)
Albumin: 3.1 g/dL — ABNORMAL LOW (ref 3.5–5.2)
Alkaline Phosphatase: 145 U/L — ABNORMAL HIGH (ref 39–117)
BUN: 15 mg/dL (ref 6–23)
CO2: 27 mEq/L (ref 19–32)
Calcium: 8.9 mg/dL (ref 8.4–10.5)
Chloride: 106 mEq/L (ref 96–112)
Creatinine, Ser: 1.15 mg/dL (ref 0.50–1.35)
GFR calc Af Amer: 67 mL/min — ABNORMAL LOW (ref >90)
GFR calc non Af Amer: 58 mL/min — ABNORMAL LOW (ref >90)
Glucose, Bld: 134 mg/dL — ABNORMAL HIGH (ref 70–99)
Potassium: 3.1 mEq/L — ABNORMAL LOW (ref 3.5–5.1)
Sodium: 144 mEq/L (ref 135–145)
Total Bilirubin: 0.5 mg/dL (ref 0.3–1.2)
Total Protein: 6 g/dL (ref 6.0–8.3)

## 2011-03-17 LAB — CBC
HCT: 37.6 % — ABNORMAL LOW (ref 39.0–52.0)
Hemoglobin: 12.4 g/dL — ABNORMAL LOW (ref 13.0–17.0)
MCH: 30 pg (ref 26.0–34.0)
MCHC: 33 g/dL (ref 30.0–36.0)
MCV: 90.8 fL (ref 78.0–100.0)
Platelets: 223 10*3/uL (ref 150–400)
RBC: 4.14 MIL/uL — ABNORMAL LOW (ref 4.22–5.81)
RDW: 12.9 % (ref 11.5–15.5)
WBC: 9.2 10*3/uL (ref 4.0–10.5)

## 2011-03-17 LAB — BASIC METABOLIC PANEL
BUN: 13 mg/dL (ref 6–23)
GFR calc Af Amer: 68 mL/min — ABNORMAL LOW (ref >90)
GFR calc non Af Amer: 59 mL/min — ABNORMAL LOW (ref >90)
Potassium: 3.3 mEq/L — ABNORMAL LOW (ref 3.5–5.1)

## 2011-03-17 LAB — PROTIME-INR
INR: 2.2 — ABNORMAL HIGH (ref 0.00–1.49)
Prothrombin Time: 26.4 seconds — ABNORMAL HIGH (ref 11.6–15.2)

## 2011-03-17 LAB — URINE MICROSCOPIC-ADD ON

## 2011-03-17 LAB — URINALYSIS, ROUTINE W REFLEX MICROSCOPIC
Glucose, UA: NEGATIVE mg/dL
Ketones, ur: 15 mg/dL — AB
Leukocytes, UA: NEGATIVE
Nitrite: NEGATIVE
Protein, ur: >300 mg/dL — AB
Specific Gravity, Urine: 1.02 (ref 1.005–1.030)
Urobilinogen, UA: 1 mg/dL (ref 0.0–1.0)
pH: 5.5 (ref 5.0–8.0)

## 2011-03-17 LAB — TROPONIN I: Troponin I: <0.3 ng/mL (ref <0.30)

## 2011-03-17 LAB — PHOSPHORUS: Phosphorus: 3.4 mg/dL (ref 2.3–4.6)

## 2011-03-17 LAB — HEPATIC FUNCTION PANEL
Bilirubin, Direct: 0.2 mg/dL (ref 0.0–0.3)
Indirect Bilirubin: 0.4 mg/dL (ref 0.3–0.9)
Total Bilirubin: 0.6 mg/dL (ref 0.3–1.2)

## 2011-03-17 LAB — PRO B NATRIURETIC PEPTIDE: Pro B Natriuretic peptide (BNP): 8442 pg/mL — ABNORMAL HIGH (ref 0–450)

## 2011-03-17 LAB — CARDIAC PANEL(CRET KIN+CKTOT+MB+TROPI): Troponin I: <0.3 ng/mL (ref <0.30)

## 2011-03-17 LAB — MAGNESIUM: Magnesium: 1.8 mg/dL (ref 1.5–2.5)

## 2011-03-17 MED ORDER — ONDANSETRON HCL 4 MG PO TABS: 4.0000 mg | ORAL_TABLET | Freq: Four times a day (QID) | ORAL | Status: DC | PRN

## 2011-03-17 MED ORDER — DOCUSATE SODIUM 100 MG PO CAPS
100.0000 mg | ORAL_CAPSULE | Freq: Two times a day (BID) | ORAL | Status: DC | PRN
  Filled 2011-03-17: qty 1

## 2011-03-17 MED ORDER — POTASSIUM CHLORIDE 10 MEQ/100ML IV SOLN
10.0000 meq | INTRAVENOUS | Status: AC
  Administered 2011-03-17 (×4): 10 meq via INTRAVENOUS
  Filled 2011-03-17 (×4): qty 100

## 2011-03-17 MED ORDER — SODIUM CHLORIDE 0.9 % IJ SOLN
3.0000 mL | Freq: Two times a day (BID) | INTRAMUSCULAR | Status: DC
  Administered 2011-03-17 – 2011-03-19 (×4): 3 mL via INTRAVENOUS

## 2011-03-17 MED ORDER — FUROSEMIDE 10 MG/ML IJ SOLN
40.0000 mg | Freq: Two times a day (BID) | INTRAMUSCULAR | Status: DC
  Administered 2011-03-17: 40 mg via INTRAVENOUS
  Filled 2011-03-17 (×2): qty 4

## 2011-03-17 MED ORDER — ACETAMINOPHEN 650 MG RE SUPP: 650.0000 mg | Freq: Four times a day (QID) | RECTAL | Status: DC | PRN

## 2011-03-17 MED ORDER — SALINE SPRAY 0.65 % NA SOLN
2.0000 | NASAL | Status: DC | PRN
  Filled 2011-03-17: qty 44

## 2011-03-17 MED ORDER — METOPROLOL TARTRATE 1 MG/ML IV SOLN
5.0000 mg | Freq: Two times a day (BID) | INTRAVENOUS | Status: DC
  Administered 2011-03-17: 5 mg via INTRAVENOUS
  Filled 2011-03-17 (×4): qty 5

## 2011-03-17 MED ORDER — WARFARIN SODIUM 2 MG PO TABS
2.0000 mg | ORAL_TABLET | Freq: Once | ORAL | Status: AC
  Filled 2011-03-17: qty 1

## 2011-03-17 MED ORDER — SODIUM CHLORIDE 0.9 % IJ SOLN: 3.0000 mL | INTRAMUSCULAR | Status: DC | PRN

## 2011-03-17 MED ORDER — FOLIC ACID 5 MG/ML IJ SOLN
1.0000 mg | Freq: Every day | INTRAMUSCULAR | Status: DC
  Administered 2011-03-17: 1 mg via INTRAVENOUS
  Filled 2011-03-17 (×4): qty 0.2

## 2011-03-17 MED ORDER — ASPIRIN 300 MG RE SUPP
300.0000 mg | Freq: Every day | RECTAL | Status: DC
  Administered 2011-03-17: 300 mg via RECTAL
  Filled 2011-03-17 (×2): qty 1

## 2011-03-17 MED ORDER — SODIUM CHLORIDE 0.9 % IJ SOLN
3.0000 mL | Freq: Two times a day (BID) | INTRAMUSCULAR | Status: DC
  Administered 2011-03-18 – 2011-03-19 (×3): 3 mL via INTRAVENOUS

## 2011-03-17 MED ORDER — THIAMINE HCL 100 MG/ML IJ SOLN
100.0000 mg | Freq: Every day | INTRAMUSCULAR | Status: DC
  Administered 2011-03-17: 100 mg via INTRAVENOUS
  Filled 2011-03-17 (×2): qty 1

## 2011-03-17 MED ORDER — ONDANSETRON HCL 4 MG/2ML IJ SOLN: 4.0000 mg | Freq: Four times a day (QID) | INTRAMUSCULAR | Status: DC | PRN

## 2011-03-17 MED ORDER — ACETAMINOPHEN 325 MG PO TABS: 650.0000 mg | ORAL_TABLET | Freq: Four times a day (QID) | ORAL | Status: DC | PRN

## 2011-03-17 MED ORDER — SODIUM CHLORIDE 0.9 % IV SOLN: 250.0000 mL | INTRAVENOUS | Status: DC | PRN

## 2011-03-17 MED ORDER — CYANOCOBALAMIN 1000 MCG/ML IJ SOLN: 1000.0000 ug | INTRAMUSCULAR | Status: DC

## 2011-03-17 MED ORDER — POTASSIUM CHLORIDE 10 MEQ/100ML IV SOLN
10.0000 meq | Freq: Once | INTRAVENOUS | Status: AC
  Administered 2011-03-18: 10 meq via INTRAVENOUS
  Filled 2011-03-17: qty 100

## 2011-03-17 MED ORDER — FUROSEMIDE 10 MG/ML IJ SOLN
40.0000 mg | Freq: Once | INTRAMUSCULAR | Status: AC
  Administered 2011-03-17: 40 mg via INTRAVENOUS
  Filled 2011-03-17: qty 4

## 2011-03-17 MED ORDER — POTASSIUM CHLORIDE 10 MEQ/100ML IV SOLN
10.0000 meq | Freq: Once | INTRAVENOUS | Status: AC
  Administered 2011-03-17: 10 meq via INTRAVENOUS
  Filled 2011-03-17 (×2): qty 100

## 2011-03-17 NOTE — ED Notes (Signed)
Urine sample requested from patient.  Urinal provided.

## 2011-03-17 NOTE — ED Provider Notes (Signed)
History     CSN: [id number]  Arrival date & time 03/17/11  1035   First MD Initiated Contact with Patient 03/17/11 1059      Chief Complaint  Patient presents with  . Near Syncope  . Weakness    (Consider location/radiation/quality/duration/timing/severity/associated sxs/prior treatment) HPI The patient had a syncopal event at the nursing home earlier today. The patient is not able to give me much history other than he is not having any pain. The son states that the nursing staff witnessed this episode and he was having increased rate of breathing.  Past Medical History  Diagnosis Date  . Dementia   . Hypertension     Past Surgical History  Procedure Date  . Open heart surgery   . Joint replacement     No family history on file.  History  Substance Use Topics  . Smoking status: Not on file  . Smokeless tobacco: Not on file  . Alcohol Use:       Review of Systems Level 5 caveat applies due to patient with dementia Allergies  Review of patient's allergies indicates no known allergies.  Home Medications   Current Outpatient Rx  Name Route Sig Dispense Refill  . ASPIRIN EC 81 MG PO TBEC Oral Take 81 mg by mouth daily.    Marland Kitchen CARVEDILOL 12.5 MG PO TABS Oral Take 12.5 mg by mouth 2 (two) times daily with a meal.    . CYANOCOBALAMIN 1000 MCG/ML IJ SOLN Intramuscular Inject 1,000 mcg into the muscle every 30 (thirty) days. Give on the 24th of each month    . DONEPEZIL HCL 10 MG PO TABS Oral Take 10 mg by mouth at bedtime.    . FUROSEMIDE 20 MG PO TABS Oral Take 20 mg by mouth daily.    . ISOSORBIDE DINITRATE 10 MG PO TABS Oral Take 15 mg by mouth daily.    Marland Kitchen BACID PO TABS Oral Take 1 tablet by mouth 2 (two) times daily. For 7 days    . LEVOFLOXACIN 500 MG PO TABS Oral Take 500 mg by mouth daily. For 7 days    . LORATADINE 10 MG PO TABS Oral Take 10 mg by mouth daily.    Marland Kitchen NITROGLYCERIN 0.4 MG SL SUBL Sublingual Place 0.4 mg under the tongue every 5 (five) minutes as  needed. For chest pain    . RAMIPRIL 5 MG PO CAPS Oral Take 5 mg by mouth 2 (two) times daily.    . SERTRALINE HCL 25 MG PO TABS Oral Take 25 mg by mouth daily.    Marland Kitchen SALINE NASAL SPRAY 0.65 % NA SOLN Nasal Place 2 sprays into the nose daily as needed. For nasal congestion    . TAMSULOSIN HCL 0.4 MG PO CAPS Oral Take 0.4 mg by mouth at bedtime.    . WARFARIN SODIUM 2 MG PO TABS Oral Take 2 mg by mouth See admin instructions. Takes 2mg  on Monday Wednesday, and Friday    . WARFARIN SODIUM 3 MG PO TABS Oral Take 3 mg by mouth See admin instructions. Takes 3mg  on Tuesday, Thursday, Saturday, Sunday      BP 153/82  Pulse 69  Temp(Src) 97.7 F (36.5 C) (Oral)  Resp 17  SpO2 99%  Physical Exam  Constitutional: He appears well-developed and well-nourished. No distress.  HENT:  Head: Normocephalic and atraumatic.  Eyes: EOM are normal. Pupils are equal, round, and reactive to light.  Neck: No tracheal deviation present. No thyromegaly present.  Cardiovascular:  Normal rate, regular rhythm and normal heart sounds.  Exam reveals no gallop and no friction rub.   No murmur heard. Pulmonary/Chest: Effort normal and breath sounds normal. No respiratory distress. He has no wheezes. He has no rales.  Abdominal: Soft. Bowel sounds are normal. There is no tenderness. There is no rebound and no guarding.  Neurological: He is alert. He has normal strength. Coordination normal.  Skin: Skin is warm and dry. No rash noted.    ED Course  Procedures (including critical care time)  Labs Reviewed  CBC - Abnormal; Notable for the following:    RBC 4.14 (*)    Hemoglobin 12.4 (*)    HCT 37.6 (*)    All other components within normal limits  DIFFERENTIAL - Abnormal; Notable for the following:    Neutrophils Relative 83 (*)    Lymphocytes Relative 8 (*)    All other components within normal limits  COMPREHENSIVE METABOLIC PANEL - Abnormal; Notable for the following:    Potassium 3.1 (*)    Glucose, Bld  134 (*)    Albumin 3.1 (*)    Alkaline Phosphatase 145 (*)    GFR calc non Af Amer 58 (*)    GFR calc Af Amer 67 (*)    All other components within normal limits  PRO B NATRIURETIC PEPTIDE - Abnormal; Notable for the following:    Pro B Natriuretic peptide (BNP) 8442.0 (*)    All other components within normal limits  TROPONIN I  URINALYSIS, ROUTINE W REFLEX MICROSCOPIC   Dg Chest 2 View  03/17/2011  *RADIOLOGY REPORT*  Clinical Data: Syncope  CHEST - 2 VIEW  Comparison: 11/12/2010  Findings:  Prior median sternotomy and CABG procedure.  Moderate cardiac enlargement.  Small bilateral pleural effusions and interstitial edema noted.  No airspace consolidation identified.  IMPRESSION:  1.  Cardiac enlargement and CHF.  Original Report Authenticated By: Rosealee Albee, M.D.   Ct Head Wo Contrast  03/17/2011  *RADIOLOGY REPORT*  Clinical Data: Near-syncope, slurred speech (now improved), dementia  CT HEAD WITHOUT CONTRAST  Technique:  Contiguous axial images were obtained from the base of the skull through the vertex without contrast.  Comparison: 10/09/2010  Findings: Motion degraded images.  No evidence of parenchymal hemorrhage or extra-axial fluid collection. No mass lesion, mass effect, or midline shift.  No CT evidence of acute infarction.  Old left frontoparietal encephalomalacia.  Subcortical white matter and periventricular small vessel ischemic changes. Intracranial atherosclerosis.  Global cortical atrophy.  No ventriculomegaly.  IMPRESSION: Motion degraded images.  No evidence of acute intracranial abnormality.  Atrophy with small vessel ischemic changes and old left frontoparietal infarct.  Original Report Authenticated By: Charline Bills, M.D.     1. Congestive heart failure (CHF)   2. Syncope   3. Hypokalemia      Date: 03/17/2011  Rate: 64  Rhythm: atrial fibrillation  QRS Axis: normal  Intervals: normal  ST/T Wave abnormalities: nonspecific ST/T changes  Conduction  Disutrbances:none  Narrative Interpretation:   Old EKG Reviewed: unchanged    MDM  MDM Reviewed: nursing note and vitals Reviewed previous: labs Interpretation: labs, x-ray and ECG Consults: admitting MD            Carlyle Dolly, PA-C 03/18/11 [id number]

## 2011-03-17 NOTE — Progress Notes (Addendum)
ANTICOAGULATION CONSULT NOTE - Initial Consult  Pharmacy Consult for Warfarin Indication: atrial fibrillation  No Known Allergies  Patient Measurements:   Heparin Dosing Weight:   Vital Signs: Temp: 97.7 F (36.5 C) (02/25 1043) Temp src: Oral (02/25 1043) BP: 154/74 mmHg (02/25 1442) Pulse Rate: 67  (02/25 1442)  Labs:  Basename 03/17/11 1620 03/17/11 1154 03/17/11 1153  HGB -- -- 12.4*  HCT -- -- 37.6*  PLT -- -- 223  APTT -- -- --  LABPROT 24.8* -- --  INR 2.20* -- --  HEPARINUNFRC -- -- --  CREATININE -- -- 1.15  CKTOTAL -- -- --  CKMB -- -- --  TROPONINI -- <0.30 --   CrCl is unknown because there is no height on file for the current visit.  Medical History: Past Medical History  Diagnosis Date  . Dementia   . Hypertension   . CHF, left ventricular   . Paroxysmal a-fib     Medications:   (Not in a hospital admission)  Assessment: 78 yo M admitted from nursing home with syncope.  Pharmacy consulted to continue warfarin for atrial fibrillation  Home dose of warfarin: warfarin 2mg  Monday, Wednesday, and Friday, 3 mg Tuesday, Thursday, Saturday and Sunday.  Goal of Therapy:  INR 2-3   Plan:  1. INR now, will return to home regimen based on INR result, currently d#? Levofloxacin from nursing home MAR.   2. Daily INR 3. Follow INR, CBC, and clinical course, dose as indicated.  5:25 PM INR resulted as 2.2, will give 2 mg dose today, follow INR day.  Lovenia Kim Pharm.D., BCPS Clinical Pharmacist 03/17/2011 4:17 PM Pager: [phone] Phone: [phone]    Trevor Webb Trevor Webb 03/17/2011,4:11 PM

## 2011-03-17 NOTE — H&P (Signed)
Hospital Admission Note Date: 03/17/2011  Patient name: Trevor Webb Medical record number: [id number] Date of birth: [birth year] Age: 78 y.o. Gender: male PCP: Darlina Guys  Medical Service: Internal Medicine Teaching Service  Attending physician: Judyann Munson    1st Contact: Thereasa Distance   Pager: [phone] 2nd Contact: Lars Mage   Pager: [phone] After 5 pm or weekends: 1st Contact:      Pager: [phone] 2nd Contact:      Pager: [phone]  Chief Complaint:  Syncope and confusion HPI 78 yr old male, nursing home resident with`past history of congestive heart failure, A fib, Alzheimers presented with disorientation and failure to respond to commands. According to the nurse who took care of him this morning, he seemed happy alert and oriented when he woke up in the morning. Later at noon she found him in the bathroom sitting on the commode with his eyes rolled back and barely able to breathe. She had to lift him and take him to his bed and he remained unresponsive for 10 to 15 minutes after which he seemed confused with slurred speech and kept falling asleep. Patients wife said that he had difficulty breathing 2 days ago for which he visited his doctor. CXR at that time showed cardiomegaly and signs of CHF. He was started on lasix 40 mg Friday, then 20 mg on the next 2 days. Levofloxacin was also started out of concern for possible PNA.  inr was 2.3  Meds: ASPIRIN 80MG  od Nitroglycerin sl 0.4mg  prn Vityamin b12 im every month on 24th Loratidine 10mg  od Tamsulosin (Flomax) od at bedtime Carvedilol 12.5mg  bd Isosorbide dinitrate 10 mg  Ramipril 5mg  bd Aricept(donepezil) od Saline nasal spray Coumadin 3mg  every Tuesday Jul 18, 2022 sat Sunday Coumadin 2mg  every Monday wed fri Levofloxacin 500mg  for 7 days Bacid tablet Lasix 20mg  od Sertraline 25mg  od  Past Medical History Chronic congestive heart failure Acute renal failure Hypertension Atrial  fibrillation Alzhiemer's  PAST SURGICAL HISTORY Multiple knee surgeries and bilateral knee replacement. Hip fracture 7 yrs ago and hip replacement. Bowel resection for diverticulitis complicated with ostomy which is corrected. CABG done in 07/18/2007  FAMILY HISTORY; Father died of MI. Mother died of stroke. 1 brother had a heart transplant, the other had MI, another had lung ca and the last one had stroke.  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 He is married has a son. No h/o smoking . no h/o etoh   Review of Systems: Per PHI  Physical Exam: Blood pressure 170/77, pulse 67, temperature 97.7 F (36.5 C), temperature source Oral, resp. rate 16, SpO2 99.00%. General Appearance:  Somnolent, response to verbal stimuli, no distress, appears stated age   Head:  Normocephalic, without obvious abnormality, atraumatic   Eyes:  PERRL, conjunctiva/corneas clear, EOM's intact, fundi  benign, both eyes   Ears:  Normal TM's and external ear canals, both ears   Nose:  Nares normal, septum midline, mucosa normal, no drainage    Throat:  Lips, mucosa, and tongue normal; teeth and gums normal   Neck:  Supple, symmetrical, trachea midline, no adenopathy;  thyroid: No enlargement/tenderness/nodules; no carotid  bruit or JVD   Back:  Symmetric, no curvature, ROM normal, no CVA tenderness   Lungs:  Distant but clear to auscultation bilaterally, respirations unlabored   Chest wall:  No tenderness or deformity   Heart:  Little tachycardia, normal rhythm, S1  and S2 normal, no murmur, rub or gallop   Abdomen:  Soft, non-tender, bowel sounds active all four quadrants,  no masses, no organomegaly   Extremities:  pitting pedal edema 1+/4 bilaterally; no cyanoses bilaterally.  Pulses:  1+ and symmetric all extremities   Skin:  Skin color, texture, turgor normal, no rashes or lesions   Neuro:   unable to do a complete exam; negative babinski and grossly non focal.  Lymph nodes:  Cervical, supraclavicular, and axillary nodes normal    Lab results: Basic Metabolic Panel:  Basename 03/17/11 1153  NA 144  K 3.1*  CL 106  CO2 27  GLUCOSE 134*  BUN 15  CREATININE 1.15  CALCIUM 8.9  MG --  PHOS --   Liver Function Tests:  Basename 03/17/11 1153  AST 9  ALT 16  ALKPHOS 145*  BILITOT 0.5  PROT 6.0  ALBUMIN 3.1*   CBC:  Basename 03/17/11 1153  WBC 9.2  NEUTROABS 7.6  HGB 12.4*  HCT 37.6*  MCV 90.8  PLT 223   Cardiac Enzymes:  Basename 03/17/11 1154  CKTOTAL --  CKMB --  CKMBINDEX --  TROPONINI <0.30   BNP:  Basename 03/17/11 1204  PROBNP 8442.0*     Basename 03/17/11 1620  LABPROT 24.8*  INR 2.20*   Urinalysis:  Basename 03/17/11 1404  COLORURINE AMBER*  LABSPEC 1.020  PHURINE 5.5  GLUCOSEU NEGATIVE  HGBUR LARGE*  BILIRUBINUR SMALL*  KETONESUR 15*  PROTEINUR >300*  UROBILINOGEN 1.0  NITRITE NEGATIVE  LEUKOCYTESUR NEGATIVE    Imaging results:  Dg Chest 2 View  03/17/2011  *RADIOLOGY REPORT*  Clinical Data: Syncope  CHEST - 2 VIEW  Comparison: 11/12/2010  Findings:  Prior median sternotomy and CABG procedure.  Moderate cardiac enlargement.  Small bilateral pleural effusions and interstitial edema noted.  No airspace consolidation identified.  IMPRESSION:  1.  Cardiac enlargement and CHF.  Original Report Authenticated By: Rosealee Albee, M.D.   Ct Head Wo Contrast  03/17/2011  *RADIOLOGY REPORT*  Clinical Data: Near-syncope, slurred speech (now improved), dementia  CT HEAD WITHOUT CONTRAST  Technique:  Contiguous axial images were obtained from the base of the skull through the vertex without contrast.  Comparison: 10/09/2010  Findings: Motion degraded images.  No evidence of parenchymal hemorrhage or extra-axial fluid collection. No mass lesion, mass effect, or midline shift.  No CT evidence of acute infarction.  Old left frontoparietal  encephalomalacia.  Subcortical white matter and periventricular small vessel ischemic changes. Intracranial atherosclerosis.  Global cortical atrophy.  No ventriculomegaly.  IMPRESSION: Motion degraded images.  No evidence of acute intracranial abnormality.  Atrophy with small vessel ischemic changes and old left frontoparietal infarct.  Original Report Authenticated By: Charline Bills, M.D.    Other results: EKG: A fib irregularly irregular with occatsional PVC. NO ST ABNORMALITIES. Borderline widened QRS  Assessment & Plan by Problem:  78 y/o gentleman with  History of Alzheimer dementia and CAD admitted with a witnessed syncopal episode while bearing down on a toilet commode. 1. AMS.  Differential list of diagnoses include but not limited to: * Decompensated acute CHF vs * vasovagal  Episode vs * cardiac dysrythmia vs ischemia *TIA * CVA is unlikely given neg CT of head. * infection such as HCAP and UTI (unlikely as without fevers and leukocytosis).  - Telemetry - Cardiac enzymes x 2 more q 6hr - EKG now and in AM - UA -Neuro checks q shift - Lasix 40 mg IV x 3 doses -  Metoprolol IV -replete Potassium -ASA PR - strict I/O -daily weights - Hold ramipril, Imdur due to NPO status  2. PAF. Currently rate-controlled. - Warfarin per pharmacy if tolerates PO. Otherwise Heparin gtt. - telemetry -metoprolol  3. alzeihmer dementia -hold Aricept due to an NPO status  4.Marland KitchenVTE: - Heparin   Signed: Shataria Crist 03/17/2011, 5:35 PM

## 2011-03-17 NOTE — ED Notes (Signed)
Per ems- pt coming from [facility] nursing facility where pt had a near syncopal episode and pt was unresponsive.  Ems state pt is normally very energetic.

## 2011-03-17 NOTE — ED Notes (Signed)
Patient unable to void 

## 2011-03-17 NOTE — ED Provider Notes (Addendum)
Patient presents from his nursing home where he had a syncopal episode today. The nursing staff state that he had some trouble breathing afterwards and that was why they called the ambulance. His son states that 3 days ago they were concerned that he had pneumonia. Patient has dementia and is unable to give any history.  Patient is resting comfortably, his tongue is dry he has some coarse breath sounds.  X-rays and labs reviewed. Patient appears to have congestive heart failure which may be what they thought was pneumonia in a nursing home. He may benefit from being admitted and giving his fluid balance regulated.   14:05 Dr Denton Meek, [facility] will come admit patient.   Medical screening examination/treatment/procedure(s) were conducted as a shared visit with non-physician practitioner(s) and myself.  I personally evaluated the patient during the encounter Devoria Albe, MD, Franz Dell, MD 03/17/11 1610  Ward Givens, MD 03/17/11 [phone]

## 2011-03-17 NOTE — ED Notes (Signed)
Patient states he tried to void and unable to.

## 2011-03-17 NOTE — ED Notes (Signed)
Room not clean   yet 

## 2011-03-17 NOTE — ED Notes (Signed)
Verbal order to place cathetar in patient.  Order given by pa/Chris Lawyer and dr. Lynelle Doctor.

## 2011-03-17 NOTE — ED Notes (Addendum)
Report given, waiting for room to be cleaned

## 2011-03-17 NOTE — Progress Notes (Signed)
1845 transferred in from ED by Marland Kitchen with ekg monitor with wife in attendance .speech clear /oriented to name , wife , place where he lives , birthday otherwise disoriented . Follows simple commands appropriately denied SOB , pain nKept comfortable in bed with box ely on afib MVR

## 2011-03-18 ENCOUNTER — Inpatient Hospital Stay (HOSPITAL_COMMUNITY): Payer: Medicare Other

## 2011-03-18 ENCOUNTER — Other Ambulatory Visit: Payer: Self-pay

## 2011-03-18 LAB — BASIC METABOLIC PANEL
BUN: 13 mg/dL (ref 6–23)
CO2: 31 mEq/L (ref 19–32)
Chloride: 104 mEq/L (ref 96–112)
Creatinine, Ser: 1.15 mg/dL (ref 0.50–1.35)
Glucose, Bld: 89 mg/dL (ref 70–99)

## 2011-03-18 LAB — CARDIAC PANEL(CRET KIN+CKTOT+MB+TROPI)
CK, MB: 1.9 ng/mL (ref 0.3–4.0)
CK, MB: 2 ng/mL (ref 0.3–4.0)
Relative Index: INVALID (ref 0.0–2.5)
Relative Index: INVALID (ref 0.0–2.5)
Total CK: 42 U/L (ref 7–232)
Troponin I: <0.3 ng/mL (ref <0.30)
Troponin I: <0.3 ng/mL

## 2011-03-18 LAB — CBC
HCT: 37.2 % — ABNORMAL LOW (ref 39.0–52.0)
MCH: 29.7 pg (ref 26.0–34.0)
MCHC: 32.3 g/dL (ref 30.0–36.0)
MCV: 92.1 fL (ref 78.0–100.0)
RDW: 12.8 % (ref 11.5–15.5)
WBC: 10 10*3/uL (ref 4.0–10.5)

## 2011-03-18 LAB — PROTIME-INR
INR: 2.39 — ABNORMAL HIGH (ref 0.00–1.49)
Prothrombin Time: 26.5 seconds — ABNORMAL HIGH (ref 11.6–15.2)

## 2011-03-18 LAB — GLUCOSE, CAPILLARY: Glucose-Capillary: 109 mg/dL — ABNORMAL HIGH (ref 70–99)

## 2011-03-18 MED ORDER — ISOSORBIDE DINITRATE 5 MG PO TABS
15.0000 mg | ORAL_TABLET | Freq: Every day | ORAL | Status: DC
  Administered 2011-03-19: 15 mg via ORAL
  Filled 2011-03-18 (×3): qty 1

## 2011-03-18 MED ORDER — SERTRALINE HCL 25 MG PO TABS
25.0000 mg | ORAL_TABLET | Freq: Every day | ORAL | Status: DC
  Administered 2011-03-18 – 2011-03-19 (×2): 25 mg via ORAL
  Filled 2011-03-18 (×2): qty 1

## 2011-03-18 MED ORDER — POTASSIUM CHLORIDE CRYS ER 20 MEQ PO TBCR
40.0000 meq | EXTENDED_RELEASE_TABLET | ORAL | Status: AC
Start: 2011-03-18 — End: 2011-03-18
  Administered 2011-03-18 (×3): 40 meq via ORAL
  Filled 2011-03-18 (×3): qty 2

## 2011-03-18 MED ORDER — CARVEDILOL 12.5 MG PO TABS
12.5000 mg | ORAL_TABLET | Freq: Two times a day (BID) | ORAL | Status: DC
  Administered 2011-03-18 – 2011-03-19 (×2): 12.5 mg via ORAL
  Filled 2011-03-18 (×4): qty 1

## 2011-03-18 MED ORDER — WARFARIN SODIUM 3 MG PO TABS
3.0000 mg | ORAL_TABLET | Freq: Once | ORAL | Status: AC
  Administered 2011-03-18: 3 mg via ORAL
  Filled 2011-03-18: qty 1

## 2011-03-18 MED ORDER — DONEPEZIL HCL 10 MG PO TABS
10.0000 mg | ORAL_TABLET | Freq: Every day | ORAL | Status: DC
  Administered 2011-03-18: 10 mg via ORAL
  Filled 2011-03-18 (×2): qty 1

## 2011-03-18 MED ORDER — RAMIPRIL 5 MG PO CAPS
5.0000 mg | ORAL_CAPSULE | Freq: Two times a day (BID) | ORAL | Status: DC
  Administered 2011-03-18 – 2011-03-19 (×3): 5 mg via ORAL
  Filled 2011-03-18 (×4): qty 1

## 2011-03-18 MED ORDER — FUROSEMIDE 20 MG PO TABS
20.0000 mg | ORAL_TABLET | Freq: Every day | ORAL | Status: DC
  Administered 2011-03-18 – 2011-03-19 (×2): 20 mg via ORAL
  Filled 2011-03-18 (×2): qty 1

## 2011-03-18 MED ORDER — HALOPERIDOL LACTATE 5 MG/ML IJ SOLN
2.0000 mg | Freq: Four times a day (QID) | INTRAMUSCULAR | Status: DC | PRN
  Administered 2011-03-19: 2 mg via INTRAMUSCULAR
  Filled 2011-03-18: qty 0.4

## 2011-03-18 MED ORDER — TAMSULOSIN HCL 0.4 MG PO CAPS
0.4000 mg | ORAL_CAPSULE | Freq: Every day | ORAL | Status: DC
  Administered 2011-03-18: 0.4 mg via ORAL
  Filled 2011-03-18 (×2): qty 1

## 2011-03-18 MED ORDER — ASPIRIN EC 81 MG PO TBEC
81.0000 mg | DELAYED_RELEASE_TABLET | Freq: Every day | ORAL | Status: DC
  Administered 2011-03-18 – 2011-03-19 (×2): 81 mg via ORAL
  Filled 2011-03-18 (×2): qty 1

## 2011-03-18 MED ORDER — BACID PO TABS
1.0000 | ORAL_TABLET | Freq: Two times a day (BID) | ORAL | Status: DC
  Administered 2011-03-18: 1 via ORAL
  Filled 2011-03-18 (×3): qty 1

## 2011-03-18 NOTE — H&P (Signed)
Internal Medicine Teaching Service Attending Note Date: 03/18/2011  Patient name: Trevor Webb  Medical record number: [id number]  Date of birth: 01/19/1929   I have seen and evaluated Trevor Webb and discussed their care with the Residency Team.  Trevor Webb is an 78yo M with advanced dementia/alzheimer's dementia, nursing home resident, cad s/p cabg, and paroxysmal afib on coumadin and asa. He presents to ED from nursing home with acute onset of probable syncope and post-spell confusion. Four days prior to admit, he had worsening LE edema and was started on increased diuretics in addition to empiric tx for hcap with levofloxacin. Patient denies having fevers, chills, nightsweats, productive cough.  Physical Exam: Blood pressure 146/62, pulse 58, temperature 98 F (36.7 C), temperature source Oral, resp. rate 18, height 6\' 2"  (1.88 m), weight 181 lb 14.1 oz (82.5 kg), SpO2 98.00%. Physical Exam  Constitutional: He is oriented to person, this morning. He appears well-developed and well-nourished. No distress.  HENT:  Mouth/Throat: Oropharynx is clear and moist. No oropharyngeal exudate.  Cardiovascular: irreg, irreg, regular rhythm and normal heart sounds. Exam reveals no gallop and no friction rub.  No murmur heard.  Pulmonary/Chest: Effort normal and breath sounds normal. No respiratory distress. He has no wheezes. Limited exam due to patient being difficult to move in chair. Abdominal: Soft. Bowel sounds are normal. He exhibits no distension. There is no tenderness.  Lymphadenopathy:  He has no cervical adenopathy.  Neurological: He is alert and oriented to person, place, and time.  Skin: Skin is warm and dry. No rash noted. No erythema.  Psychiatric: He has a normal mood and affect. His behavior is normal.     Lab results: Results for orders placed during the hospital encounter of 03/17/11 (from the past 24 hour(s))  PROTIME-INR     Status: Abnormal   Collection Time   03/17/11  4:20 PM      Component Value Range   Prothrombin Time 24.8 (*) 11.6 - 15.2 (seconds)   INR 2.20 (*) 0.00 - 1.49   CARDIAC PANEL(CRET KIN+CKTOT+MB+TROPI)     Status: Normal   Collection Time   03/17/11  9:16 PM      Component Value Range   Total CK 50  7 - 232 (U/L)   CK, MB 1.9  0.3 - 4.0 (ng/mL)   Troponin I <0.30  <0.30 (ng/mL)   Relative Index RELATIVE INDEX IS INVALID  0.0 - 2.5   HEPATIC FUNCTION PANEL     Status: Abnormal   Collection Time   03/17/11  9:25 PM      Component Value Range   Total Protein 5.8 (*) 6.0 - 8.3 (g/dL)   Albumin 3.0 (*) 3.5 - 5.2 (g/dL)   AST 9  0 - 37 (U/L)   ALT 14  0 - 53 (U/L)   Alkaline Phosphatase 133 (*) 39 - 117 (U/L)   Total Bilirubin 0.6  0.3 - 1.2 (mg/dL)   Bilirubin, Direct 0.2  0.0 - 0.3 (mg/dL)   Indirect Bilirubin 0.4  0.3 - 0.9 (mg/dL)  MAGNESIUM     Status: Normal   Collection Time   03/17/11  9:25 PM      Component Value Range   Magnesium 1.8  1.5 - 2.5 (mg/dL)  PHOSPHORUS     Status: Normal   Collection Time   03/17/11  9:25 PM      Component Value Range   Phosphorus 3.4  2.3 - 4.6 (mg/dL)  PROTIME-INR  Status: Abnormal   Collection Time   03/17/11  9:25 PM      Component Value Range   Prothrombin Time 26.4 (*) 11.6 - 15.2 (seconds)   INR 2.38 (*) 0.00 - 1.49   TSH     Status: Normal   Collection Time   03/17/11  9:25 PM      Component Value Range   TSH 0.876  0.350 - 4.500 (uIU/mL)  APTT     Status: Abnormal   Collection Time   03/17/11  9:25 PM      Component Value Range   aPTT 42 (*) 24 - 37 (seconds)  BASIC METABOLIC PANEL     Status: Abnormal   Collection Time   03/17/11  9:25 PM      Component Value Range   Sodium 143  135 - 145 (mEq/L)   Potassium 3.3 (*) 3.5 - 5.1 (mEq/L)   Chloride 104  96 - 112 (mEq/L)   CO2 29  19 - 32 (mEq/L)   Glucose, Bld 98  70 - 99 (mg/dL)   BUN 13  6 - 23 (mg/dL)   Creatinine, Ser 6.21  0.50 - 1.35 (mg/dL)   Calcium 8.7  8.4 - 30.8 (mg/dL)   GFR calc non Af Amer 59 (*)  >90 (mL/min)   GFR calc Af Amer 68 (*) >90 (mL/min)  CARDIAC PANEL(CRET KIN+CKTOT+MB+TROPI)     Status: Normal   Collection Time   03/18/11  5:25 AM      Component Value Range   Total CK 44  7 - 232 (U/L)   CK, MB 1.9  0.3 - 4.0 (ng/mL)   Troponin I <0.30  <0.30 (ng/mL)   Relative Index RELATIVE INDEX IS INVALID  0.0 - 2.5   PROTIME-INR     Status: Abnormal   Collection Time   03/18/11  5:35 AM      Component Value Range   Prothrombin Time 26.5 (*) 11.6 - 15.2 (seconds)   INR 2.39 (*) 0.00 - 1.49   BASIC METABOLIC PANEL     Status: Abnormal   Collection Time   03/18/11  5:35 AM      Component Value Range   Sodium 145  135 - 145 (mEq/L)   Potassium 3.2 (*) 3.5 - 5.1 (mEq/L)   Chloride 104  96 - 112 (mEq/L)   CO2 31  19 - 32 (mEq/L)   Glucose, Bld 89  70 - 99 (mg/dL)   BUN 13  6 - 23 (mg/dL)   Creatinine, Ser 6.57  0.50 - 1.35 (mg/dL)   Calcium 8.9  8.4 - 84.6 (mg/dL)   GFR calc non Af Amer 58 (*) >90 (mL/min)   GFR calc Af Amer 67 (*) >90 (mL/min)  CBC     Status: Abnormal   Collection Time   03/18/11  5:35 AM      Component Value Range   WBC 10.0  4.0 - 10.5 (K/uL)   RBC 4.04 (*) 4.22 - 5.81 (MIL/uL)   Hemoglobin 12.0 (*) 13.0 - 17.0 (g/dL)   HCT 96.2 (*) 95.2 - 52.0 (%)   MCV 92.1  78.0 - 100.0 (fL)   MCH 29.7  26.0 - 34.0 (pg)   MCHC 32.3  30.0 - 36.0 (g/dL)   RDW 84.1  32.4 - 40.1 (%)   Platelets 209  150 - 400 (K/uL)  CARDIAC PANEL(CRET KIN+CKTOT+MB+TROPI)     Status: Normal   Collection Time   03/18/11 10:55 AM  Component Value Range   Total CK 42  7 - 232 (U/L)   CK, MB 2.0  0.3 - 4.0 (ng/mL)   Troponin I <0.30  <0.30 (ng/mL)   Relative Index RELATIVE INDEX IS INVALID  0.0 - 2.5   GLUCOSE, CAPILLARY     Status: Abnormal   Collection Time   03/18/11 10:56 AM      Component Value Range   Glucose-Capillary 109 (*) 70 - 99 (mg/dL)   Comment 1 Notify RN      Imaging results:  Dg Chest 1 View  03/18/2011  *RADIOLOGY REPORT*  Clinical Data: Follow up  congestive heart failure  CHEST - 1 VIEW  Comparison: 03/17/2011  Findings: Cardiomegaly again noted.  Status post CABG. Minimal perihilar interstitial prominence without convincing pulmonary edema.  Question trace left pleural effusion.  No focal infiltrate.  IMPRESSION:  Status post CABG. Minimal perihilar interstitial prominence without convincing pulmonary edema.  Question trace left pleural effusion.  No focal infiltrate.  Original Report Authenticated By: Natasha Mead, M.D.   Dg Chest 2 View  03/17/2011  *RADIOLOGY REPORT*  Clinical Data: Syncope  CHEST - 2 VIEW  Comparison: 11/12/2010  Findings:  Prior median sternotomy and CABG procedure.  Moderate cardiac enlargement.  Small bilateral pleural effusions and interstitial edema noted.  No airspace consolidation identified.  IMPRESSION:  1.  Cardiac enlargement and CHF.  Original Report Authenticated By: Rosealee Albee, M.D.   Ct Head Wo Contrast  03/17/2011  *RADIOLOGY REPORT*  Clinical Data: Near-syncope, slurred speech (now improved), dementia  CT HEAD WITHOUT CONTRAST  Technique:  Contiguous axial images were obtained from the base of the skull through the vertex without contrast.  Comparison: 10/09/2010  Findings: Motion degraded images.  No evidence of parenchymal hemorrhage or extra-axial fluid collection. No mass lesion, mass effect, or midline shift.  No CT evidence of acute infarction.  Old left frontoparietal encephalomalacia.  Subcortical white matter and periventricular small vessel ischemic changes. Intracranial atherosclerosis.  Global cortical atrophy.  No ventriculomegaly.  IMPRESSION: Motion degraded images.  No evidence of acute intracranial abnormality.  Atrophy with small vessel ischemic changes and old left frontoparietal infarct.  Original Report Authenticated By: Charline Bills, M.D.    Assessment and Plan: I agree with the formulated Assessment and Plan as outlined by Dr. Denton Meek. Will monitor patient for further episode of  near syncope. Cardiac causes of acute MI or acute arrhythmia have been ruled out. The patient is more lucid and back to baseline this morning. He answers questions appropriately but has limited memory of the events that brought him to the hospital, we Will start oral intake once patient is more lucid and can swallow without risk of aspiration.  Duke Salvia Drue Second MD MPH [facility] [phone]

## 2011-03-18 NOTE — Progress Notes (Signed)
Speech Language/Pathology Clinical/Bedside Swallow Evaluation Patient Details  Name: Trevor Webb MRN: [id number] DOB: [birth year] Today's Date: 03/18/2011  Past Medical History:  Past Medical History  Diagnosis Date  . Hypertension   . CHF, left ventricular   . Paroxysmal atrial fibrillation   . CHF (congestive heart failure)   . Angina   . Myocardial infarction 2009  . Stroke 2011    residual:  "right sided weakness; problems walking; dementia"  . Dementia     S/P CVA 2011  . Arthritis    Past Surgical History:  Past Surgical History  Procedure Date  . Coronary artery bypass graft 2009    CABG X4  . Knee arthroscopy     multiple; bilaterally  . Total knee arthroplasty     bilateral  . Total hip arthroplasty     right  . Joint replacement     bilateral knees; right hip  . Colon surgery   . Colon resection 1980's    "had colostomy bag for awhile; went back in and put it all back together"  . Colostomy takedown 1980's   HPI:   78 y/o gentleman with  History of Alzheimer dementia and CAD admitted with a witnessed syncopal episode.  Pt's son reports excellent appetite with no swallowing issues in past.     Assessment/Recommendations/Treatment Plan   Clinical Impression: Pt with normal oropharyngeal swallow.  Rec regular diet with thin liquids.  No SLP f/u warranted - will sign-off.  Recommendations Solid Consistency: Regular Liquid Consistency: Thin Medication Administration: Whole meds with liquid Supervision: Patient able to self feed Follow up Recommendations: None  Treatment Plan Treatment Plan Recommendations: No treatment recommended at this time     Swallowing Goals n/a     Trevor Echavarria L. Samson Frederic, MA CCC/SLP Pager [phone]  Trevor Webb 03/18/2011,10:28 AM

## 2011-03-18 NOTE — ED Provider Notes (Signed)
See prior note   Ward Givens, MD 03/18/11 1859

## 2011-03-18 NOTE — Progress Notes (Signed)
Subjective; 78 y/o gentleman with history of Alzhiemers dementia and CHF who was admitted after a near syncopal attack. Today The patient  Seems alert and happy. No acute events overnight. He has started taking a normal diet.  Objective: Vital signs in last 24 hours: Filed Vitals:   03/18/11 0255 03/18/11 0551 03/18/11 0900 03/18/11 1337  BP: 161/53 175/93 157/77 146/62  Pulse: 70 58 53 58  Temp: 99.1 F (37.3 C) 96.6 F (35.9 C)  98 F (36.7 C)  TempSrc:      Resp: 16 12 16 18   Height:      Weight:  82.5 kg (181 lb 14.1 oz)    SpO2: 100% 100% 99% 98%   Weight change:   Intake/Output Summary (Last 24 hours) at 03/18/11 1403 Last data filed at 03/18/11 1338  Gross per 24 hour  Intake    440 ml  Output   5201 ml  Net  -4761 ml   Physical Exam: BP 146/62  Pulse 58  Temp(Src) 98 F (36.7 C) (Oral)  Resp 18  Ht 6\' 2"  (1.88 m)  Wt 82.5 kg (181 lb 14.1 oz)  BMI 23.35 kg/m2  SpO2 98%  General Appearance:    Alert, cooperative, no distress, appears stated age  Head:    Normocephalic, without obvious abnormality, atraumatic  Eyes:    PERRL, conjunctiva/corneas clear, EOM's intact, fundi    benign, both eyes       Ears:    Normal TM's and external ear canals, both ears  Nose:   Nares normal, septum midline, mucosa normal, no drainage    or sinus tenderness  Throat:   Lips, mucosa, and tongue normal; teeth and gums normal  Neck:   Supple, symmetrical, trachea midline, no adenopathy;       thyroid:  No enlargement/tenderness/nodules; no carotid   bruit or JVD  Back:     Symmetric, no curvature, ROM normal, no CVA tenderness  Lungs:     Clear to auscultation bilaterally, respirations unlabored  Chest wall:    No tenderness or deformity  Heart:    Regular rate and rhythm, S1 and S2 normal, no murmur, rub   or gallop  Abdomen:     Soft, non-tender, bowel sounds active all four quadrants,    no masses, no organomegaly  Genitalia:    Rectal:    Normal tone, normal prostate, no  masses or tenderness;   guaiac negative stool  Extremities:    B/l pedal edema  Pulses:   1+ and symmetric all extremities  Skin:   Skin tenting+  Lymph nodes:   Cervical, supraclavicular, and axillary nodes normal  Neurologic:   CNII-XII intact. Normal strength, sensation and reflexes      throughout   Lab Results: BMET    Component Value Date/Time   NA 145 03/18/2011 0535   K 3.2* 03/18/2011 0535   CL 104 03/18/2011 0535   CO2 31 03/18/2011 0535   GLUCOSE 89 03/18/2011 0535   BUN 13 03/18/2011 0535   CREATININE 1.15 03/18/2011 0535   CALCIUM 8.9 03/18/2011 0535   GFRNONAA 58* 03/18/2011 0535   GFRAA 67* 03/18/2011 0535   CBC    Component Value Date/Time   WBC 10.0 03/18/2011 0535   RBC 4.04* 03/18/2011 0535   HGB 12.0* 03/18/2011 0535   HCT 37.2* 03/18/2011 0535   PLT 209 03/18/2011 0535   MCV 92.1 03/18/2011 0535   MCH 29.7 03/18/2011 0535   MCHC 32.3 03/18/2011 0535  RDW 12.8 03/18/2011 0535   LYMPHSABS 0.8 03/17/2011 1153   MONOABS 0.8 03/17/2011 1153   EOSABS 0.0 03/17/2011 1153   BASOSABS 0.0 03/17/2011 1153   Micro Results: No results found for this or any previous visit (from the past 240 hour(s)). Studies/Results: Dg Chest 1 View  03/18/2011  *RADIOLOGY REPORT*  Clinical Data: Follow up congestive heart failure  CHEST - 1 VIEW  Comparison: 03/17/2011  Findings: Cardiomegaly again noted.  Status post CABG. Minimal perihilar interstitial prominence without convincing pulmonary edema.  Question trace left pleural effusion.  No focal infiltrate.  IMPRESSION:  Status post CABG. Minimal perihilar interstitial prominence without convincing pulmonary edema.  Question trace left pleural effusion.  No focal infiltrate.  Original Report Authenticated By: Natasha Mead, M.D.   Dg Chest 2 View  03/17/2011  *RADIOLOGY REPORT*  Clinical Data: Syncope  CHEST - 2 VIEW  Comparison: 11/12/2010  Findings:  Prior median sternotomy and CABG procedure.  Moderate cardiac enlargement.  Small bilateral pleural  effusions and interstitial edema noted.  No airspace consolidation identified.  IMPRESSION:  1.  Cardiac enlargement and CHF.  Original Report Authenticated By: Rosealee Albee, M.D.   Ct Head Wo Contrast  03/17/2011  *RADIOLOGY REPORT*  Clinical Data: Near-syncope, slurred speech (now improved), dementia  CT HEAD WITHOUT CONTRAST  Technique:  Contiguous axial images were obtained from the base of the skull through the vertex without contrast.  Comparison: 10/09/2010  Findings: Motion degraded images.  No evidence of parenchymal hemorrhage or extra-axial fluid collection. No mass lesion, mass effect, or midline shift.  No CT evidence of acute infarction.  Old left frontoparietal encephalomalacia.  Subcortical white matter and periventricular small vessel ischemic changes. Intracranial atherosclerosis.  Global cortical atrophy.  No ventriculomegaly.  IMPRESSION: Motion degraded images.  No evidence of acute intracranial abnormality.  Atrophy with small vessel ischemic changes and old left frontoparietal infarct.  Original Report Authenticated By: Charline Bills, M.D.   Medications: I have reviewed the patient's current medications. Scheduled Meds:   . aspirin EC  81 mg Oral Daily  . carvedilol  12.5 mg Oral BID WC  . cyanocobalamin  1,000 mcg Intramuscular Q30 days  . donepezil  10 mg Oral QHS  . furosemide  40 mg Intravenous Once  . furosemide  20 mg Oral Daily  . isosorbide dinitrate  15 mg Oral Daily  . lactobacillus acidophilus  1 tablet Oral BID  . potassium chloride  10 mEq Intravenous Q1 Hr x 5  . potassium chloride  10 mEq Intravenous Once  . potassium chloride  10 mEq Intravenous Once  . potassium chloride  40 mEq Oral Q4H  . ramipril  5 mg Oral BID  . sertraline  25 mg Oral Daily  . sodium chloride  3 mL Intravenous Q12H  . sodium chloride  3 mL Intravenous Q12H  . Tamsulosin HCl  0.4 mg Oral QHS  . warfarin  2 mg Oral ONCE-1800  . warfarin  3 mg Oral ONCE-1800  . DISCONTD:  aspirin  300 mg Rectal Daily  . DISCONTD: folic acid  1 mg Intravenous Daily  . DISCONTD: furosemide  40 mg Intravenous Q12H  . DISCONTD: metoprolol  5 mg Intravenous Q12H  . DISCONTD: thiamine  100 mg Intravenous Daily   Continuous Infusions:  PRN Meds:.sodium chloride, acetaminophen, acetaminophen, docusate sodium, ondansetron (ZOFRAN) IV, ondansetron, sodium chloride, sodium chloride Assessment/Plan: In view of decompensated acute CHF, continue lasix 20mg  carvedilol, isosorbide dinitrate.  For cardiac dysrythmia-  Telemetry, ekg,  warfarin  For hypokalemia- Kcl iv  BMP, strict I/O, daily weights  Plan for discharge after assessing patient tomorrow.    LOS: 1 day   Thereasa Distance ALEX 03/18/2011, 2:03 PM  PGY1 Addendum I agree with MS4 note above.  Please see my note below for complete details. S: Overnight, the patient remained somnolent (consistent with his typical sundowning).  This morning, the patient's mental status has improved, and his son notes that his mental status is back at his baseline.  Patient tolerating a regular diet.  No specific memory of yesterday's events.  O:  General: alert, cooperative, and in no apparent distress HEENT: pupils equal round and reactive to light, vision grossly intact, oropharynx clear and non-erythematous  Neck: supple, no lymphadenopathy Lungs: clear to ascultation bilaterally, normal work of respiration, no wheezes, rales, ronchi Heart: regular rate and rhythm, no murmurs, gallops, or rubs Abdomen: soft, non-tender, non-distended, normal bowel sounds Extremities: 1+ pitting edema bilaterally Neurologic: alert & oriented X3, cranial nerves II-XII intact, strength grossly intact, sensation intact to light touch  I have reviewed labs and imaging.  A/P: The patient is an 78 yo man, history of HTN, CHF, paroxysmal afib, CAD, prior CVA, and dementia, presenting with CHF exacerbation and altered mental status, now improved.  #  Altered Mental Status - the patient presents with AMS, found sitting on his toilet sedated.  Differential is broad, but includes vasovagal syncope (though patient did not lose postural control) vs TIA vs Stroke (though patient has now recovered with no focal neuro deficits) vs metabolic disturbance (though initial labs showed only hypokalemia) vs seizure with post-ictal period (no witnessed myoclonic activity) vs arrythmia.  Symptoms likely involved a combination of acute CHF exacerbation, combined with straining during a bowel movement to produce acute decompensation in cardiac output.  The patient was somnolent for most of the evening yesterday (which likely had components of patient's baseline sundowning).  This morning, the patient is alert and oriented, and back to his baseline mental status according to his son. -treat CHF as below -continue to monitor on telemetry  # Acute on Chronic CHF - the patient has a history of ischemic CM, with EF = 55-60%.  The patient noted increased dyspnea, fatigue, and LE edema 3 days PTA, with CXR findings of pulm edema +/- opacity, consistent with CHF exacerbation.  The patient presents with continued LE edema, and dyspnea, along with AMS. -initially treated with IV lasix and metoprolol due to AMS, now discontinued -re-starting home medication regimen, including coreg, isordil, and ramipril -continuing lasix 20 mg qday -strict I's/O's, daily weights  # Hypertension - chronic, stable -continue home medication regimen  # Dementia - Alzheimers, with sundowning -continue donepezil -haldol prn for agitation/delirium  # Atrial fibrillation - currently managed with warfarin -continue warfarin  # CAD - s/p CABG in 2009 -continue aspirin -continue statin, antihypertensives  # Hypokalemia -repleting  # Prophy - Warfarin  Signed Janalyn Harder, PGY1 03/18/2011 3:47 PM

## 2011-03-18 NOTE — Progress Notes (Signed)
CSW received referral to assist with dc planning back to snf. Pt is long-term SNF resident at [facility] and plan is to return at dc. Pt has dementia, is pleasant, and has good support from son and pt's wife.  Pt's son [name] at bedside. Pt's son reports that they would like to transport pt back to facility at dc via car. CSW written assessment in shadow chart. No further concerns at this time. CSW will f/u to assist as needed as Pt's care progresses. Frederico Hamman, LCSW [phone]

## 2011-03-18 NOTE — Progress Notes (Signed)
Chaplain responded to a spiritual care consult. Chaplain visited with both the patient and his wife. Chaplain experienced the patient as reserved; when he did speak, he was very soft-spoken. The patient stated that he did not remember most of yesterday when he was admitted to the hospital through the ED. Nonetheless, the patient's wife stated that the patient looked better today and that they were coping okay. Chaplain listened and prayed with family. The patient and his wife are members of [employer] in [place]. They may or may not contact their family minister depending on the length of his hospital stay. No follow up needed.

## 2011-03-18 NOTE — Progress Notes (Signed)
ANTICOAGULATION CONSULT NOTE - Follow Up Consult  Pharmacy Consult for Coumadin Indication: atrial fibrillation  Allergies  Allergen Reactions  . Morphine And Related     Delusions   Vital Signs: Temp: 96.6 F (35.9 C) (02/26 0551) BP: 175/93 mmHg (02/26 0551) Pulse Rate: 58  (02/26 0551)  Labs:  Alvira Philips 03/18/11 0535 03/18/11 0525 03/17/11 2125 03/17/11 2116 03/17/11 1620 03/17/11 1154 03/17/11 1153  HGB 12.0* -- -- -- -- -- 12.4*  HCT 37.2* -- -- -- -- -- 37.6*  PLT 209 -- -- -- -- -- 223  APTT -- -- 42* -- -- -- --  LABPROT 26.5* -- 26.4* -- 24.8* -- --  INR 2.39* -- 2.38* -- 2.20* -- --  HEPARINUNFRC -- -- -- -- -- -- --  CREATININE 1.15 -- 1.13 -- -- -- 1.15  CKTOTAL -- 44 -- 50 -- -- --  CKMB -- 1.9 -- 1.9 -- -- --  TROPONINI -- <0.30 -- <0.30 -- <0.30 --   Estimated Creatinine Clearance: 58.6 ml/min (by C-G formula based on Cr of 1.15).  Assessment: 83yom on coumadin for afib with a therapeutic INR. 2/25 dose not given due to NPO status. No bleeding noted.  Goal of Therapy:  INR 2-3   Plan:  1) Coumadin 3mg  x 1 - IV formulation available if needed 2) Follow up INR in AM  Fredrik Rigger 03/18/2011,9:02 AM

## 2011-03-18 NOTE — Progress Notes (Signed)
03/18/11 1730 UR COMPLETED. Tera Mater, RN, BSN [phone]

## 2011-03-19 LAB — CBC
HCT: 37.7 % — ABNORMAL LOW (ref 39.0–52.0)
Hemoglobin: 12.2 g/dL — ABNORMAL LOW (ref 13.0–17.0)
MCHC: 32.4 g/dL (ref 30.0–36.0)
RBC: 4.08 MIL/uL — ABNORMAL LOW (ref 4.22–5.81)

## 2011-03-19 LAB — BASIC METABOLIC PANEL
BUN: 18 mg/dL (ref 6–23)
Chloride: 106 mEq/L (ref 96–112)
GFR calc Af Amer: 59 mL/min — ABNORMAL LOW (ref >90)
Glucose, Bld: 101 mg/dL — ABNORMAL HIGH (ref 70–99)
Potassium: 4.2 mEq/L (ref 3.5–5.1)

## 2011-03-19 LAB — PROTIME-INR: Prothrombin Time: 24.3 seconds — ABNORMAL HIGH (ref 11.6–15.2)

## 2011-03-19 MED ORDER — WARFARIN - PHARMACIST DOSING INPATIENT
Freq: Every day | Status: DC
  Filled 2011-03-19: qty 1

## 2011-03-19 MED ORDER — WARFARIN SODIUM 3 MG PO TABS
3.0000 mg | ORAL_TABLET | Freq: Once | ORAL | Status: DC
  Filled 2011-03-19: qty 1

## 2011-03-19 MED ORDER — WARFARIN SODIUM 5 MG PO TABS
5.0000 mg | ORAL_TABLET | Freq: Once | ORAL | Status: DC
  Filled 2011-03-19: qty 1

## 2011-03-19 MED ORDER — LACTINEX PO CHEW
1.0000 | CHEWABLE_TABLET | Freq: Two times a day (BID) | ORAL | Status: DC
  Administered 2011-03-19: 1 via ORAL
  Filled 2011-03-19 (×3): qty 1

## 2011-03-19 NOTE — Discharge Summary (Signed)
Internal Medicine Teaching Service Attending Note Date: 03/19/2011  Patient name: Trevor Webb  Medical record number: [id number]  Date of birth: April 28, 1928    This patient has been seen and discussed with the house staff. Please see their note for complete details. I concur with their findings and discharge plan as outlined by Dr. Eben Burow.  Judyann Munson 03/19/2011, 4:33 PM

## 2011-03-19 NOTE — Progress Notes (Signed)
Clinical Social Work-CSW attempted to d/c pt to [facility]-CSW faxed d/c summary and AVS- pt family left with  patient and were not given d/c packet. CSW will contact SNF and fax FL2- No further needs at this time-Anniebell Bedore-MSW, [phone]

## 2011-03-19 NOTE — Discharge Summary (Signed)
Internal Medicine Teaching [facility] Discharge Note  Name: Trevor Webb MRN: [id number] DOB: 14-Jul-1928 78 y.o.  Date of Admission: 03/17/2011 10:35 AM Date of Discharge: 03/19/2011 Attending Physician: Judyann Munson, MD  Discharge Diagnosis: Acute on chronic systolic CHF Altered mental status Hypertension Dementia alzheimer Atrial Fib- currently on coumadin CAD Hypokalemia    Discharge Medications: Medication List  As of 03/19/2011 12:18 PM   STOP taking these medications         furosemide 20 MG tablet      levofloxacin 500 MG tablet      warfarin 3 MG tablet         TAKE these medications         aspirin EC 81 MG tablet   Take 81 mg by mouth daily.      carvedilol 12.5 MG tablet   Commonly known as: COREG   Take 12.5 mg by mouth 2 (two) times daily with a meal.      cyanocobalamin 1000 MCG/ML injection   Commonly known as: (VITAMIN B-12)   Inject 1,000 mcg into the muscle every 30 (thirty) days. Give on the 24th of each month      donepezil 10 MG tablet   Commonly known as: ARICEPT   Take 10 mg by mouth at bedtime.      isosorbide dinitrate 10 MG tablet   Commonly known as: ISORDIL   Take 15 mg by mouth daily.      lactobacillus acidophilus Tabs   Take 1 tablet by mouth 2 (two) times daily. For 7 days      loratadine 10 MG tablet   Commonly known as: CLARITIN   Take 10 mg by mouth daily.      nitroGLYCERIN 0.4 MG SL tablet   Commonly known as: NITROSTAT   Place 0.4 mg under the tongue every 5 (five) minutes as needed. For chest pain      ramipril 5 MG capsule   Commonly known as: ALTACE   Take 5 mg by mouth 2 (two) times daily.      sertraline 25 MG tablet   Commonly known as: ZOLOFT   Take 25 mg by mouth daily.      sodium chloride 0.65 % nasal spray   Commonly known as: OCEAN   Place 2 sprays into the nose daily as needed. For nasal congestion      Tamsulosin HCl 0.4 MG Caps   Commonly known as: FLOMAX   Take 0.4 mg by mouth  at bedtime.      warfarin 2 MG tablet   Commonly known as: COUMADIN   Take 2 mg by mouth See admin instructions. Takes 2mg  on Monday Wednesday, and Friday            Disposition and follow-up:   Mr.Jakaree A Boxx was discharged from [facility] in Stable condition.    Follow-up Appointments:  Discharge Orders    Future Orders Please Complete By Expires   Diet - low sodium heart healthy      Increase activity slowly      Discharge instructions      Comments:   1. Patient needs to be weighed daily and his weight needs to be recorded everyday. 2. Please start him on 40mg  lasix daily and have him seen by a physician as soon as possible if he has any of the following symptoms: a) 3 pound weight gain in 24 hours or 5 pounds in 1 week b) shortness of  breath, with or without a dry hacking cough c) swelling in the hands, feet or stomach d) if you have to sleep on extra pillows at night in order to breathe. 3. Patient will need PT to work with him 3 times a day at least for the next 2 weeks to mobilize and get him to his baseline and manage accordingly after that time.   No wound care      (HEART FAILURE PATIENTS) Call MD:  Anytime you have any of the following symptoms: 1) 3 pound weight gain in 24 hours or 5 pounds in 1 week 2) shortness of breath, with or without a dry hacking cough 3) swelling in the hands, feet or stomach 4) if you have to sleep on extra pillows at night in order to breathe.         Consultations:    Procedures Performed:  Dg Chest 1 View 03/18/2011  CHEST - 1 VIEW   IMPRESSION:  Status post CABG. Minimal perihilar interstitial prominence without convincing pulmonary edema.  Question trace left pleural effusion.  No focal infiltrate.    Dg Chest 2 View 03/17/2011 IMPRESSION:  1.  Cardiac enlargement and CHF.    Ct Head Wo Contrast 03/17/2011 IMPRESSION: Motion degraded images.  No evidence of acute intracranial abnormality.  Atrophy with small  vessel ischemic changes and old left frontoparietal infarct.    Admission HPI: 78 yr old male, nursing home resident with`past history of congestive heart failure, A fib, Alzheimers presented with disorientation and failure to respond to commands. According to the nurse who took care of him this morning, he seemed happy alert and oriented when he woke up in the morning. Later at noon she found him in the bathroom sitting on the commode with his eyes rolled back and barely able to breathe. She had to lift him and take him to his bed and he remained unresponsive for 10 to 15 minutes after which he seemed confused with slurred speech and kept falling asleep. Patients wife said that he had difficulty breathing 2 days ago for which he visited his doctor. CXR at that time showed cardiomegaly and signs of CHF. He was started on lasix 40 mg Friday, then 20 mg on the next 2 days. Levofloxacin was also started out of concern for possible PNA. inr was 2.3   Hospital Course by problem list:  # Altered Mental Status - The patient presented with AMS, found sitting on his toilet sedated. Differential was broad, but included vasovagal syncope (though patient did not lose postural control) vs TIA vs Stroke (though patient has now recovered with no focal neuro deficits) vs metabolic disturbance (though initial labs showed only hypokalemia) vs seizure with post-ictal period (no witnessed myoclonic activity) vs arrythmia. Symptoms likely involved a combination of acute CHF exacerbation, combined with straining during a bowel movement to produce acute decompensation in cardiac output. Patient recovered from his ams with diuresis and we think that it was most likely 2/2 acute exacerbation his CHF acting as a stressor.   # Acute on Chronic CHF - Initially treated with IV lasix and metoprolol due to AMS and patient responded well and was discharged fairly euvolemic.  # Hypertension - Chronic, stable. Continue home medication  regimen   # Dementia - Alzheimers, with sundowning, continue donepezil.   # Atrial fibrillation - Currently managed with warfarin We continued warfarin and his INR was therapeutic on the day of discharge.   # CAD - s/p CABG in 2009  We continued aspirin and statin, antihypertensives   # Hypokalemia  -Repleted during hospital course of stay.  Discharge Vitals:  BP 155/78  Pulse 65  Temp(Src) 97.3 F (36.3 C) (Oral)  Resp 16  Ht 6\' 2"  (1.88 m)  Wt 176 lb 5.9 oz (80 kg)  BMI 22.64 kg/m2  SpO2 97%  Discharge Labs:  Results for orders placed during the hospital encounter of 03/17/11 (from the past 24 hour(s))  GLUCOSE, CAPILLARY     Status: Abnormal   Collection Time   03/18/11  4:03 PM      Component Value Range   Glucose-Capillary 128 (*) 70 - 99 (mg/dL)   Comment 1 Notify RN    PROTIME-INR     Status: Abnormal   Collection Time   03/19/11  5:32 AM      Component Value Range   Prothrombin Time 24.3 (*) 11.6 - 15.2 (seconds)   INR 2.14 (*) 0.00 - 1.49   BASIC METABOLIC PANEL     Status: Abnormal   Collection Time   03/19/11  5:32 AM      Component Value Range   Sodium 147 (*) 135 - 145 (mEq/L)   Potassium 4.2  3.5 - 5.1 (mEq/L)   Chloride 106  96 - 112 (mEq/L)   CO2 33 (*) 19 - 32 (mEq/L)   Glucose, Bld 101 (*) 70 - 99 (mg/dL)   BUN 18  6 - 23 (mg/dL)   Creatinine, Ser 1.61  0.50 - 1.35 (mg/dL)   Calcium 8.9  8.4 - 09.6 (mg/dL)   GFR calc non Af Amer 51 (*) >90 (mL/min)   GFR calc Af Amer 59 (*) >90 (mL/min)  CBC     Status: Abnormal   Collection Time   03/19/11  5:32 AM      Component Value Range   WBC 10.2  4.0 - 10.5 (K/uL)   RBC 4.08 (*) 4.22 - 5.81 (MIL/uL)   Hemoglobin 12.2 (*) 13.0 - 17.0 (g/dL)   HCT 04.5 (*) 40.9 - 52.0 (%)   MCV 92.4  78.0 - 100.0 (fL)   MCH 29.9  26.0 - 34.0 (pg)   MCHC 32.4  30.0 - 36.0 (g/dL)   RDW 81.1  91.4 - 78.2 (%)   Platelets 215  150 - 400 (K/uL)  GLUCOSE, CAPILLARY     Status: Normal   Collection Time   03/19/11 11:50 AM        Component Value Range   Glucose-Capillary 94  70 - 99 (mg/dL)    Signed: Lars Mage 03/19/2011, 12:18 PM

## 2011-03-19 NOTE — Progress Notes (Signed)
D/c instructions given to pt family regarding home medications, f/u care and diet/activity restrictions. Pt family verbalize understanding with all questions answered and pt family acknowledged receipt.  Telemetry and IV removed from pt.  Pt dressed and transport via wheelchair to ride.

## 2011-03-19 NOTE — Progress Notes (Signed)
ANTICOAGULATION CONSULT NOTE - Follow Up Consult  Pharmacy Consult for Coumadin Indication: atrial fibrillation  Allergies  Allergen Reactions  . Morphine And Related     Delusions   Vital Signs: Temp: 97.3 F (36.3 C) (02/27 0624) BP: 153/99 mmHg (02/27 0624) Pulse Rate: 85  (02/27 0624)  Labs:  Trevor Webb 03/19/11 0532 03/18/11 1055 03/18/11 0535 03/18/11 0525 03/17/11 2125 03/17/11 2116 03/17/11 1153  HGB 12.2* -- 12.0* -- -- -- --  HCT 37.7* -- 37.2* -- -- -- 37.6*  PLT 215 -- 209 -- -- -- 223  APTT -- -- -- -- 42* -- --  LABPROT 24.3* -- 26.5* -- 26.4* -- --  INR 2.14* -- 2.39* -- 2.38* -- --  HEPARINUNFRC -- -- -- -- -- -- --  CREATININE 1.28 -- 1.15 -- 1.13 -- --  CKTOTAL -- 42 -- 44 -- 50 --  CKMB -- 2.0 -- 1.9 -- 1.9 --  TROPONINI -- <0.30 -- <0.30 -- <0.30 --   Estimated Creatinine Clearance: 51.2 ml/min (by C-G formula based on Cr of 1.28).  Assessment: 83yom on coumadin for afib with a therapeutic INR. INR 2.14 today. 2/25 dose not given due to NPO status. Now taking po's.  No bleeding noted. H/H, pltc stable.   Goal of Therapy:  INR 2-3   Plan:  1) Coumadin 3mg  x 1   2) Follow up INR in AM  Trevor Webb, Kristine Royal, RPh 03/19/2011,8:56 AM

## 2011-03-19 NOTE — Progress Notes (Signed)
Subjective; 78 y/o gentleman with history of Alzhiemers dementia and CHF who was admitted after a near syncopal attack.Patient is back to his baseline and seems ready to be discharged home.  Objective: Vital signs in last 24 hours: Filed Vitals:   03/18/11 2212 03/19/11 0605 03/19/11 0624 03/19/11 1111  BP: 147/94 153/99 153/99 155/78  Pulse: 70 68 85 65  Temp: 97.1 F (36.2 C)  97.3 F (36.3 C)   TempSrc:      Resp: 16  16 16   Height:      Weight:   176 lb 5.9 oz (80 kg)   SpO2: 97%  99% 97%   Weight change: -5 lb 8.2 oz (-2.5 kg)  Intake/Output Summary (Last 24 hours) at 03/19/11 1233 Last data filed at 03/19/11 0900  Gross per 24 hour  Intake    960 ml  Output    876 ml  Net     84 ml   Physical Exam: BP 155/78  Pulse 65  Temp(Src) 97.3 F (36.3 C) (Oral)  Resp 16  Ht 6\' 2"  (1.88 m)  Wt 176 lb 5.9 oz (80 kg)  BMI 22.64 kg/m2  SpO2 97%  General Appearance:    Alert, cooperative, no distress, appears stated age  Head:    Normocephalic, without obvious abnormality, atraumatic  Eyes:    PERRL, conjunctiva/corneas clear, EOM's intact, fundi    benign, both eyes       Ears:    Normal TM's and external ear canals, both ears  Nose:   Nares normal, septum midline, mucosa normal, no drainage    or sinus tenderness  Throat:   Lips, mucosa, and tongue normal; teeth and gums normal  Neck:   Supple, symmetrical, trachea midline, no adenopathy;       thyroid:  No enlargement/tenderness/nodules; no carotid   bruit or JVD  Back:     Symmetric, no curvature, ROM normal, no CVA tenderness  Lungs:     Clear to auscultation bilaterally, respirations unlabored  Chest wall:    No tenderness or deformity  Heart:    Regular rate and rhythm, S1 and S2 normal, no murmur, rub   or gallop  Abdomen:     Soft, non-tender, bowel sounds active all four quadrants,    no masses, no organomegaly  Genitalia:    Rectal:    Normal tone, normal prostate, no masses or tenderness;   guaiac negative  stool  Extremities:    B/l pedal edema  Pulses:   1+ and symmetric all extremities  Skin:   Skin tenting+  Lymph nodes:   Cervical, supraclavicular, and axillary nodes normal  Neurologic:   CNII-XII intact. Normal strength, sensation and reflexes      throughout   Lab Results: BMET    Component Value Date/Time   NA 147* 03/19/2011 0532   K 4.2 03/19/2011 0532   CL 106 03/19/2011 0532   CO2 33* 03/19/2011 0532   GLUCOSE 101* 03/19/2011 0532   BUN 18 03/19/2011 0532   CREATININE 1.28 03/19/2011 0532   CALCIUM 8.9 03/19/2011 0532   GFRNONAA 51* 03/19/2011 0532   GFRAA 59* 03/19/2011 0532   CBC    Component Value Date/Time   WBC 10.2 03/19/2011 0532   RBC 4.08* 03/19/2011 0532   HGB 12.2* 03/19/2011 0532   HCT 37.7* 03/19/2011 0532   PLT 215 03/19/2011 0532   MCV 92.4 03/19/2011 0532   MCH 29.9 03/19/2011 0532   MCHC 32.4 03/19/2011 0532   RDW  13.0 03/19/2011 0532   LYMPHSABS 0.8 03/17/2011 1153   MONOABS 0.8 03/17/2011 1153   EOSABS 0.0 03/17/2011 1153   BASOSABS 0.0 03/17/2011 1153   Micro Results: No results found for this or any previous visit (from the past 240 hour(s)). Studies/Results: Dg Chest 1 View  03/18/2011  IMPRESSION:  Status post CABG. Minimal perihilar interstitial prominence without convincing pulmonary edema.  Question trace left pleural effusion.  No focal infiltrate.    Ct Head Wo Contrast 03/17/2011  IMPRESSION: Motion degraded images.  No evidence of acute intracranial abnormality.  Atrophy with small vessel ischemic changes and old left frontoparietal infarct.   Medications: I have reviewed the patient's current medications. Scheduled Meds:    . aspirin EC  81 mg Oral Daily  . carvedilol  12.5 mg Oral BID WC  . cyanocobalamin  1,000 mcg Intramuscular Q30 days  . donepezil  10 mg Oral QHS  . furosemide  20 mg Oral Daily  . isosorbide dinitrate  15 mg Oral Daily  . lactobacillus acidophilus & bulgar  1 tablet Oral BID  . potassium chloride  40 mEq Oral Q4H  .  ramipril  5 mg Oral BID  . sertraline  25 mg Oral Daily  . sodium chloride  3 mL Intravenous Q12H  . sodium chloride  3 mL Intravenous Q12H  . Tamsulosin HCl  0.4 mg Oral QHS  . warfarin  2 mg Oral ONCE-1800  . warfarin  3 mg Oral ONCE-1800  . warfarin  3 mg Oral ONCE-1800  . Warfarin - Pharmacist Dosing Inpatient   Does not apply q1800  . DISCONTD: lactobacillus acidophilus  1 tablet Oral BID  . DISCONTD: warfarin  5 mg Oral ONCE-1800   Continuous Infusions:  PRN Meds:.sodium chloride, acetaminophen, acetaminophen, docusate sodium, haloperidol lactate, ondansetron (ZOFRAN) IV, ondansetron, sodium chloride, sodium chloride  # Altered Mental Status -Patient's ams cleared up completely and he is back to his baseline.  # Acute on Chronic CHF - the patient has a history of ischemic CM, with EF = 55-60%.  The patient noted increased dyspnea, fatigue, and LE edema 3 days PTA, with CXR findings of pulm edema +/- opacity, consistent with CHF exacerbation.  The patient presents with continued LE edema, and dyspnea, along with AMS. -initially treated with IV lasix and metoprolol due to AMS, now discontinued -re-starting home medication regimen, including coreg, isordil, and ramipril, DC home today.  # Hypertension - chronic, stable -continue home medication regimen  # Dementia - Alzheimers, with sundowning -continue donepezil -haldol prn for agitation/delirium  # Atrial fibrillation - currently managed with warfarin -continue warfarin  # CAD - s/p CABG in 2009 -continue aspirin -continue statin, antihypertensives  # Hypokalemia -repleting  # Prophy - Warfarin  Signed Lars Mage MD [facility] Internal Medicine Resident Pager [phone]

## 2011-07-03 ENCOUNTER — Inpatient Hospital Stay (HOSPITAL_COMMUNITY)
Admission: EM | Admit: 2011-07-03 | Discharge: 2011-07-09 | DRG: 392 | Disposition: A | Discharge status: Skilled Nursing Facility | Payer: Medicare Other | Source: Ambulatory Visit | Attending: Internal Medicine | Admitting: Internal Medicine

## 2011-07-03 ENCOUNTER — Encounter (HOSPITAL_COMMUNITY): Payer: Self-pay | Admitting: *Deleted

## 2011-07-03 ENCOUNTER — Telehealth (INDEPENDENT_AMBULATORY_CARE_PROVIDER_SITE_OTHER): Payer: Self-pay

## 2011-07-03 DIAGNOSIS — I639 Cerebral infarction, unspecified: Secondary | ICD-10-CM

## 2011-07-03 DIAGNOSIS — R5381 Other malaise: Secondary | ICD-10-CM | POA: Diagnosis present

## 2011-07-03 DIAGNOSIS — I219 Acute myocardial infarction, unspecified: Secondary | ICD-10-CM | POA: Diagnosis present

## 2011-07-03 DIAGNOSIS — K560 Paralytic ileus: Secondary | ICD-10-CM | POA: Diagnosis present

## 2011-07-03 DIAGNOSIS — E876 Hypokalemia: Secondary | ICD-10-CM | POA: Diagnosis present

## 2011-07-03 DIAGNOSIS — N179 Acute kidney failure, unspecified: Secondary | ICD-10-CM

## 2011-07-03 DIAGNOSIS — K630 Abscess of intestine: Secondary | ICD-10-CM | POA: Diagnosis present

## 2011-07-03 DIAGNOSIS — Z96649 Presence of unspecified artificial hip joint: Secondary | ICD-10-CM

## 2011-07-03 DIAGNOSIS — K5720 Diverticulitis of large intestine with perforation and abscess without bleeding: Secondary | ICD-10-CM

## 2011-07-03 DIAGNOSIS — Z66 Do not resuscitate: Secondary | ICD-10-CM | POA: Diagnosis present

## 2011-07-03 DIAGNOSIS — I69998 Other sequelae following unspecified cerebrovascular disease: Secondary | ICD-10-CM

## 2011-07-03 DIAGNOSIS — N182 Chronic kidney disease, stage 2 (mild): Secondary | ICD-10-CM | POA: Diagnosis present

## 2011-07-03 DIAGNOSIS — I252 Old myocardial infarction: Secondary | ICD-10-CM

## 2011-07-03 DIAGNOSIS — K5792 Diverticulitis of intestine, part unspecified, without perforation or abscess without bleeding: Secondary | ICD-10-CM | POA: Diagnosis present

## 2011-07-03 DIAGNOSIS — D649 Anemia, unspecified: Secondary | ICD-10-CM | POA: Diagnosis present

## 2011-07-03 DIAGNOSIS — M1990 Unspecified osteoarthritis, unspecified site: Secondary | ICD-10-CM | POA: Diagnosis present

## 2011-07-03 DIAGNOSIS — I10 Essential (primary) hypertension: Secondary | ICD-10-CM | POA: Diagnosis present

## 2011-07-03 DIAGNOSIS — F0390 Unspecified dementia without behavioral disturbance: Secondary | ICD-10-CM | POA: Diagnosis present

## 2011-07-03 DIAGNOSIS — I480 Paroxysmal atrial fibrillation: Secondary | ICD-10-CM | POA: Diagnosis present

## 2011-07-03 DIAGNOSIS — I5022 Chronic systolic (congestive) heart failure: Secondary | ICD-10-CM | POA: Diagnosis present

## 2011-07-03 DIAGNOSIS — Z96659 Presence of unspecified artificial knee joint: Secondary | ICD-10-CM

## 2011-07-03 DIAGNOSIS — I509 Heart failure, unspecified: Secondary | ICD-10-CM | POA: Diagnosis present

## 2011-07-03 DIAGNOSIS — Z951 Presence of aortocoronary bypass graft: Secondary | ICD-10-CM

## 2011-07-03 DIAGNOSIS — I129 Hypertensive chronic kidney disease with stage 1 through stage 4 chronic kidney disease, or unspecified chronic kidney disease: Secondary | ICD-10-CM | POA: Diagnosis present

## 2011-07-03 DIAGNOSIS — Z7901 Long term (current) use of anticoagulants: Secondary | ICD-10-CM

## 2011-07-03 DIAGNOSIS — D68318 Other hemorrhagic disorder due to intrinsic circulating anticoagulants, antibodies, or inhibitors: Secondary | ICD-10-CM

## 2011-07-03 DIAGNOSIS — I4891 Unspecified atrial fibrillation: Secondary | ICD-10-CM | POA: Diagnosis present

## 2011-07-03 DIAGNOSIS — Z823 Family history of stroke: Secondary | ICD-10-CM

## 2011-07-03 DIAGNOSIS — K5732 Diverticulitis of large intestine without perforation or abscess without bleeding: Secondary | ICD-10-CM

## 2011-07-03 LAB — COMPREHENSIVE METABOLIC PANEL
ALT: 18 U/L (ref 0–53)
AST: 20 U/L (ref 0–37)
Alkaline Phosphatase: 103 U/L (ref 39–117)
CO2: 23 mEq/L (ref 19–32)
Chloride: 97 mEq/L (ref 96–112)
GFR calc non Af Amer: 31 mL/min — ABNORMAL LOW (ref >90)
Glucose, Bld: 124 mg/dL — ABNORMAL HIGH (ref 70–99)
Sodium: 134 mEq/L — ABNORMAL LOW (ref 135–145)
Total Bilirubin: 0.7 mg/dL (ref 0.3–1.2)

## 2011-07-03 LAB — CBC
Hemoglobin: 11.6 g/dL — ABNORMAL LOW (ref 13.0–17.0)
MCH: 28.9 pg (ref 26.0–34.0)
MCHC: 32.1 g/dL (ref 30.0–36.0)
MCV: 90 fL (ref 78.0–100.0)
RBC: 4.01 MIL/uL — ABNORMAL LOW (ref 4.22–5.81)

## 2011-07-03 LAB — LACTIC ACID, PLASMA: Lactic Acid, Venous: 2.1 mmol/L (ref 0.5–2.2)

## 2011-07-03 MED ORDER — SODIUM CHLORIDE 0.9 % IV SOLN
INTRAVENOUS | Status: AC
  Administered 2011-07-03: 22:00:00 via INTRAVENOUS

## 2011-07-03 MED ORDER — CIPROFLOXACIN IN D5W 200 MG/100ML IV SOLN
200.0000 mg | Freq: Two times a day (BID) | INTRAVENOUS | Status: DC
  Administered 2011-07-04 – 2011-07-06 (×5): 200 mg via INTRAVENOUS
  Filled 2011-07-03 (×9): qty 100

## 2011-07-03 MED ORDER — ACETAMINOPHEN 325 MG PO TABS: 650.0000 mg | ORAL_TABLET | Freq: Four times a day (QID) | ORAL | Status: DC | PRN

## 2011-07-03 MED ORDER — AZELASTINE HCL 0.1 % NA SOLN
2.0000 | Freq: Two times a day (BID) | NASAL | Status: DC | PRN
  Filled 2011-07-03: qty 30

## 2011-07-03 MED ORDER — CIPROFLOXACIN IN D5W 400 MG/200ML IV SOLN
400.0000 mg | Freq: Once | INTRAVENOUS | Status: AC
  Administered 2011-07-03: 400 mg via INTRAVENOUS
  Filled 2011-07-03: qty 200

## 2011-07-03 MED ORDER — LORATADINE 10 MG PO TABS
10.0000 mg | ORAL_TABLET | Freq: Every day | ORAL | Status: DC
  Administered 2011-07-04 – 2011-07-09 (×6): 10 mg via ORAL
  Filled 2011-07-03 (×6): qty 1

## 2011-07-03 MED ORDER — CARVEDILOL 12.5 MG PO TABS
12.5000 mg | ORAL_TABLET | Freq: Two times a day (BID) | ORAL | Status: DC
  Administered 2011-07-04 – 2011-07-09 (×11): 12.5 mg via ORAL
  Filled 2011-07-03 (×14): qty 1

## 2011-07-03 MED ORDER — NITROGLYCERIN 0.4 MG SL SUBL: 0.4000 mg | SUBLINGUAL_TABLET | SUBLINGUAL | Status: DC | PRN

## 2011-07-03 MED ORDER — CIPROFLOXACIN IN D5W 400 MG/200ML IV SOLN
400.0000 mg | Freq: Two times a day (BID) | INTRAVENOUS | Status: DC
  Filled 2011-07-03 (×2): qty 200

## 2011-07-03 MED ORDER — DONEPEZIL HCL 10 MG PO TABS
10.0000 mg | ORAL_TABLET | Freq: Every day | ORAL | Status: DC
  Administered 2011-07-03 – 2011-07-08 (×6): 10 mg via ORAL
  Filled 2011-07-03 (×7): qty 1

## 2011-07-03 MED ORDER — CYANOCOBALAMIN 1000 MCG/ML IJ SOLN: 1000.0000 ug | INTRAMUSCULAR | Status: DC

## 2011-07-03 MED ORDER — TAMSULOSIN HCL 0.4 MG PO CAPS
0.4000 mg | ORAL_CAPSULE | Freq: Every day | ORAL | Status: DC
  Administered 2011-07-03 – 2011-07-08 (×6): 0.4 mg via ORAL
  Filled 2011-07-03 (×7): qty 1

## 2011-07-03 MED ORDER — ASPIRIN EC 81 MG PO TBEC
81.0000 mg | DELAYED_RELEASE_TABLET | Freq: Every day | ORAL | Status: DC
  Administered 2011-07-04 – 2011-07-09 (×6): 81 mg via ORAL
  Filled 2011-07-03 (×6): qty 1

## 2011-07-03 MED ORDER — SALINE SPRAY 0.65 % NA SOLN
2.0000 | Freq: Every day | NASAL | Status: DC | PRN
  Filled 2011-07-03: qty 44

## 2011-07-03 MED ORDER — ISOSORBIDE DINITRATE 5 MG PO TABS
15.0000 mg | ORAL_TABLET | Freq: Every day | ORAL | Status: DC
  Filled 2011-07-03: qty 1

## 2011-07-03 MED ORDER — METRONIDAZOLE IN NACL 5-0.79 MG/ML-% IV SOLN
500.0000 mg | Freq: Three times a day (TID) | INTRAVENOUS | Status: DC
  Administered 2011-07-03 – 2011-07-06 (×8): 500 mg via INTRAVENOUS
  Filled 2011-07-03 (×11): qty 100

## 2011-07-03 MED ORDER — METRONIDAZOLE IN NACL 5-0.79 MG/ML-% IV SOLN: 500.0000 mg | Freq: Once | INTRAVENOUS | Status: DC

## 2011-07-03 MED ORDER — SALINE NASAL SPRAY 0.65 % NA SOLN: 2.0000 | Freq: Every day | NASAL | Status: DC | PRN

## 2011-07-03 MED ORDER — ACETAMINOPHEN 650 MG RE SUPP: 650.0000 mg | Freq: Four times a day (QID) | RECTAL | Status: DC | PRN

## 2011-07-03 MED ORDER — ONDANSETRON HCL 4 MG/2ML IJ SOLN: 4.0000 mg | Freq: Four times a day (QID) | INTRAMUSCULAR | Status: DC | PRN

## 2011-07-03 MED ORDER — SODIUM CHLORIDE 0.9 % IV SOLN: INTRAVENOUS | Status: DC

## 2011-07-03 MED ORDER — SERTRALINE HCL 25 MG PO TABS
25.0000 mg | ORAL_TABLET | Freq: Every day | ORAL | Status: DC
  Administered 2011-07-04 – 2011-07-09 (×6): 25 mg via ORAL
  Filled 2011-07-03 (×6): qty 1

## 2011-07-03 MED ORDER — ONDANSETRON HCL 4 MG PO TABS: 4.0000 mg | ORAL_TABLET | Freq: Four times a day (QID) | ORAL | Status: DC | PRN

## 2011-07-03 NOTE — ED Notes (Signed)
Pt pulled out IV on accident. Pt was receiving Cipro. RN unsure of the amount that lost while IV was out. Remainder of Cipro was restarted in IV on Rt forearm. New IV site wrapped with gauze to help prevent accidental removal.

## 2011-07-03 NOTE — Consult Note (Signed)
Reason for Consult:abdominal pain Referring Physician: [name] is an 78 y.o. male.  HPI: 78 yo male with history of CHF, Afib on coumadin, dementia, recurrent diverticulitis s/p partial colectomy 12 years ago who presents from his SNF [facility] in [place]. Accompanied by wife and son who provide history. He has LLQ pain started 5 days ago. Has been followed with serial plain films for concern of ileus, clinically had seemed to improve until yesterday evening the pain became worse. Had a normal bowel movement two days ago. Was taking clears but now emesis x 2 and sent for CT scan today at [facility] showing abnormal phlegmon collection, possible perforation with abscess formation 34mm x 63mm x 54mm. Has not been given any pain meds or antibiotics to family's knowledge. Family states they preferred patient be admitted to [facility] as he comes here frequently as opposed to [facility].      Past Medical History  Diagnosis Date  . Hypertension   . CHF, left ventricular   . Paroxysmal atrial fibrillation   . CHF (congestive heart failure)   . Angina   . Myocardial infarction 2009  . Stroke 2011    residual:  "right sided weakness; problems walking; dementia"  . Dementia     S/P CVA 2011  . Arthritis     Past Surgical History  Procedure Date  . Coronary artery bypass graft 2009    CABG X4  . Knee arthroscopy     multiple; bilaterally  . Total knee arthroplasty     bilateral  . Total hip arthroplasty     right  . Joint replacement     bilateral knees; right hip  . Colon surgery   . Colon resection 1980's    "had colostomy bag for awhile; went back in and put it all back together"  . Colostomy takedown 1980's    History reviewed. No pertinent family history.  Social History:  reports that he has never smoked. He has never used smokeless tobacco. He reports that he does not drink alcohol or use illicit drugs.  Allergies:  Allergies  Allergen Reactions  .  Morphine And Related     Delusions    Medications: I have reviewed the patient's current medications.  Results for orders placed during the hospital encounter of 07/03/11 (from the past 48 hour(s))  CBC     Status: Abnormal   Collection Time   07/03/11  2:10 PM      Component Value Range Comment   WBC 10.3  4.0 - 10.5 K/uL    RBC 4.01 (*) 4.22 - 5.81 MIL/uL    Hemoglobin 11.6 (*) 13.0 - 17.0 g/dL    HCT 16.1 (*) 09.6 - 52.0 %    MCV 90.0  78.0 - 100.0 fL    MCH 28.9  26.0 - 34.0 pg    MCHC 32.1  30.0 - 36.0 g/dL    RDW 04.5  40.9 - 81.1 %    Platelets 222  150 - 400 K/uL   LIPASE, BLOOD     Status: Normal   Collection Time   07/03/11  2:10 PM      Component Value Range Comment   Lipase 27  11 - 59 U/L   PROTIME-INR     Status: Abnormal   Collection Time   07/03/11  2:10 PM      Component Value Range Comment   Prothrombin Time 41.3 (*) 11.6 - 15.2 seconds    INR  4.22 (*) 0.00 - 1.49   LACTIC ACID, PLASMA     Status: Normal   Collection Time   07/03/11  2:10 PM      Component Value Range Comment   Lactic Acid, Venous 2.1  0.5 - 2.2 mmol/L   COMPREHENSIVE METABOLIC PANEL     Status: Abnormal   Collection Time   07/03/11  2:10 PM      Component Value Range Comment   Sodium 134 (*) 135 - 145 mEq/L    Potassium 4.0  3.5 - 5.1 mEq/L    Chloride 97  96 - 112 mEq/L    CO2 23  19 - 32 mEq/L    Glucose, Bld 124 (*) 70 - 99 mg/dL    BUN 30 (*) 6 - 23 mg/dL    Creatinine, Ser 1.61 (*) 0.50 - 1.35 mg/dL    Calcium 8.6  8.4 - 09.6 mg/dL    Total Protein 6.6  6.0 - 8.3 g/dL    Albumin 3.0 (*) 3.5 - 5.2 g/dL    AST 20  0 - 37 U/L    ALT 18  0 - 53 U/L    Alkaline Phosphatase 103  39 - 117 U/L    Total Bilirubin 0.7  0.3 - 1.2 mg/dL    GFR calc non Af Amer 31 (*) >90 mL/min    GFR calc Af Amer 35 (*) >90 mL/min     No results found.  Review of Systems  Constitutional: Negative.   HENT: Negative.   Eyes: Negative.   Respiratory: Negative.   Cardiovascular: Negative.     Gastrointestinal: Positive for nausea and vomiting.  Genitourinary: Negative.   Musculoskeletal: Negative.   Skin: Negative.   Neurological: Positive for focal weakness.  Endo/Heme/Allergies: Negative.   Psychiatric/Behavioral: Negative.    Blood pressure 114/50, pulse 75, temperature 97.7 F (36.5 C), temperature source Oral, resp. rate 16, SpO2 97.00%. Physical Exam: HENT: as per above ROS:  Abdomen is tender to palpation in the LLQ, bowel sounds are present, no bloating, no recent BM. No nausea or emesis at present.    Assessment/Plan: Multiple medical problems including probable diverticulitis/Possible SBO.  Plan: Recommend: 1. NPO status, bowel rest. 2. IVF (gentle hydration, Hx of CHF) 3. IV Cipro and Flagyl Abx coverage. 4. Dr. Janee Webb will review CT results with Radiology. 5. Hold Coumadin 6. Await further recommendations as per Dr. Janee Webb. After he reviews CT results done at [facility]  and sees patient.   Trevor Webb 07/03/2011, 4:58 PM

## 2011-07-03 NOTE — ED Notes (Signed)
Pt has had abdominal since Sunday and oral contrast CT done and shows severe diverticulitis. CT has performed at [facility] and son has results with him. Pt with nausea and vomiting.reports abdominal pain to mid abd region.

## 2011-07-03 NOTE — ED Provider Notes (Addendum)
3:42 PM  Date: 07/03/2011  Rate: 62  Rhythm: atrial fibrillation and premature ventricular contractions (PVC)  QRS Axis: left  Intervals: normal QRS:  Poor R wave progression in precordial leads suggests old anterior myocardial infarction  ST/T Wave abnormalities: normal  Conduction Disutrbances:nonspecific intraventricular conduction delay  Narrative Interpretation: Abnormal EKG.   Old EKG Reviewed: unchanged   4:15 PM Case discussed with Internal Medicine --> General Surgery, with Dr. Violeta Gelinas, who advised that he would see the patient in consultation --> Internal Medicine to admit the patient to a telemetry bed, covering for Dr. Earl Gala.    Carleene Cooper III, MD 07/03/11 1549  Carleene Cooper III, MD 07/03/11 [id number]  Carleene Cooper III, MD 07/03/11 1716  Carleene Cooper III, MD 07/03/11 [id number]

## 2011-07-03 NOTE — H&P (Signed)
Patient's PCP: Dr. [name], per patient and wife.  Chief Complaint: Abdominal pain  History of Present Illness: Trevor Webb is a 78 y.o. Caucasian male with history of hypertension, chronic systolic heart failure, MI, stroke with right sided weakness, dementia, paroxysmal A. fib on chronic anticoagulation, history of diverticulitis with colonic resection with colostomy bag and colostomy takedown was performed many years ago who presents with the above complaints.  Patient reports that his symptoms started on 06/29/2011 when he was not feeling well.  On 06/30/2011 he had abdominal pain, abdominal x-ray was done which showed mild ileus.  Patient was placed on clear liquid diet.  Initially his symptoms were improving.  However he developed nausea and vomited twice today.  CT of the abdomen and pelvis was done which showed extensive diverticulosis of the colon with abnormal extraluminal gas and phlegmon collection in the left lower quadrant, with ventral and medial to the descending-sigmoid junction which is inseparable from multiple adjacent but nondilated small bowel loops, abscess measuring 3.4 x 6.3 x 5.4 cm.  As a result patient was transferred to [facility] at wife's request.  Patient denies any recent fevers, chills.  Denies any chest pain or shortness of breath.  Denies any diarrhea.  Has not had a bowel movement for the last few days.  Denies any headaches or vision changes.  Past Medical History  Diagnosis Date  . Hypertension   . CHF, left ventricular   . Paroxysmal atrial fibrillation   . CHF (congestive heart failure)   . Angina   . Myocardial infarction 2009  . Stroke 2011    residual:  "right sided weakness; problems walking; dementia"  . Dementia     S/P CVA 2011  . Arthritis    Past Surgical History  Procedure Date  . Coronary artery bypass graft 2009    CABG X4  . Knee arthroscopy     multiple; bilaterally  . Total knee arthroplasty     bilateral  . Total  hip arthroplasty     right  . Joint replacement     bilateral knees; right hip  . Colon surgery   . Colon resection 1980's    "had colostomy bag for awhile; went back in and put it all back together"  . Colostomy takedown 1980's   Family History  Problem Relation Age of Onset  . Stroke Mother   . Heart attack Father    History   Social History  . Marital Status: Married    Spouse Name: N/A    Number of Children: N/A  . Years of Education: N/A   Occupational History  . Not on file.   Social History Main Topics  . Smoking status: Never Smoker   . Smokeless tobacco: Never Used  . Alcohol Use: No  . Drug Use: No  . Sexually Active: Not Currently   Other Topics Concern  . Not on file   Social History Narrative  . No narrative on file   Allergies: Morphine and related  Meds: Scheduled Meds:   . ciprofloxacin  400 mg Intravenous Once  . metronidazole  500 mg Intravenous Once   Continuous Infusions:   . sodium chloride    . DISCONTD: sodium chloride     PRN Meds:.  Review of Systems: All systems reviewed with the patient and positive as per history of present illness, otherwise all other systems are negative.  Physical Exam: Blood pressure 114/50, pulse 75, temperature 97.7 F (36.5 C), temperature  source Oral, resp. rate 16, SpO2 97.00%. General: Awake, No acute distress. HEENT: EOMI, Moist mucous membranes Neck: Supple CV: S1 and S2 Lungs: Clear to ascultation bilaterally Abdomen: Soft, tender in the left lower quadrant Nondistended, +bowel sounds, no guarding or rebound tenderness. Ext: Good pulses. Trace edema. No clubbing or cyanosis noted. Neuro: Cranial Nerves II-XII grossly intact. Has 5/5 motor strength in upper. 4/5 motor strength in L>R lower extremities.  Lab results:  [facility] 07/03/11 1410  NA 134*  K 4.0  CL 97  CO2 23  GLUCOSE 124*  BUN 30*  CREATININE 1.95*  CALCIUM 8.6  MG --  PHOS --    Basename 07/03/11 1410  AST 20    ALT 18  ALKPHOS 103  BILITOT 0.7  PROT 6.6  ALBUMIN 3.0*    Basename 07/03/11 1410  LIPASE 27  AMYLASE --    Basename 07/03/11 1410  WBC 10.3  NEUTROABS --  HGB 11.6*  HCT 36.1*  MCV 90.0  PLT 222   No results found for this basename: CKTOTAL:3,CKMB:3,CKMBINDEX:3,TROPONINI:3 in the last 72 hours No components found with this basename: POCBNP:3 No results found for this basename: DDIMER in the last 72 hours No results found for this basename: HGBA1C:2 in the last 72 hours No results found for this basename: CHOL:2,HDL:2,LDLCALC:2,TRIG:2,CHOLHDL:2,LDLDIRECT:2 in the last 72 hours No results found for this basename: TSH,T4TOTAL,FREET3,T3FREE,THYROIDAB in the last 72 hours No results found for this basename: VITAMINB12:2,FOLATE:2,FERRITIN:2,TIBC:2,IRON:2,RETICCTPCT:2 in the last 72 hours Imaging results:  No results found. Other results: EKG: A. fib.  Assessment & Plan by Problem: Abdominal pain from diverticulitis with possible ileus with phlegmon/abscess NPO. Continue ciprofloxacin and metronidazole. Appreciate General Surgery evaluation.  Plan to "cool off" the patient with IV antibiotics.  Hold Coumadin, likely will need to have his anticoagulation reversed prior to any surgical procedures.  Acute renal failure on chronic kidney disease stage II Hold furosemide for now.  Hold ramipril.  Gentle hydration with IV fluids given history of chronic systolic congestive heart failure.  On ciprofloxacin to cover for any urinary tract infection.  Hypertension Patient mildly hypotensive in the emergency department.  Holding ramipril and furosemide.  Continue isosorbide mononitrate and carvedilol with hold parameters.  Chronic systolic congestive heart failure Patient appears compensated at this time.  Monitor input and output carefully.  Paroxysmal atrial fibrillation Rate controlled.  Continue carvedilol.  Holding Coumadin for any surgical procedures.  History of CVA with  right-sided deficits Stable.  Dementia Stable.  At baseline per wife.  Anemia Hemoglobin stable.  Likely due to chronic disease.  CODE STATUS DO NOT RESUSCITATE/DO NOT INTUBATE.  Discussed with patient and wife at the time of admission.  Disposition Admit the patient to telemetry to Dr. Newell Coral service.  Time spent on admission, talking to the patient, and coordinating care was: 60 mins.  Jomarion Mish A, MD 07/03/2011, 5:44 PM

## 2011-07-03 NOTE — ED Provider Notes (Signed)
History     CSN: [id number]  Arrival date & time 07/03/11  1255   First MD Initiated Contact with Patient 07/03/11 1406      Chief Complaint  Patient presents with  . Abdominal Pain    (Consider location/radiation/quality/duration/timing/severity/associated sxs/prior treatment) HPI Comments: 78 yo male with history of CHF, Afib on coumadin, dementia, recurrent diverticulitis s/p partial colectomy 12 years ago who presents from his SNF [facility] in [place]. Accompanied by wife and son who provide history. He has LLQ pain started 5 days ago. Has been followed with serial plain films for concern of ileus, clinically had seemed to improve until yesterday evening the pain became worse. Had a normal bowel movement two days ago. Was taking clears but now emesis x 2 and sent for CT scan today at [facility] showing abnormal phlegmon collection, possible perforation with abscess formation 34mm x 63mm x 54mm. Has not been given any pain meds or antibiotics to family's knowledge. Family states they preferred patient be admitted to [facility] as he comes here frequently as opposed to [facility].   Has transportable DNR.  ROS limited by dementia, wife states he has been more sleepy than usual, having normal urine output and no extra edema.   Patient is a 78 y.o. male presenting with abdominal pain.  Abdominal Pain The primary symptoms of the illness include abdominal pain. The primary symptoms of the illness do not include shortness of breath or dysuria.    Past Medical History  Diagnosis Date  . Hypertension   . CHF, left ventricular   . Paroxysmal atrial fibrillation   . CHF (congestive heart failure)   . Angina   . Myocardial infarction 2009  . Stroke 2011    residual:  "right sided weakness; problems walking; dementia"  . Dementia     S/P CVA 2011  . Arthritis     Past Surgical History  Procedure Date  . Coronary artery bypass graft 2009    CABG X4  . Knee arthroscopy     multiple;  bilaterally  . Total knee arthroplasty     bilateral  . Total hip arthroplasty     right  . Joint replacement     bilateral knees; right hip  . Colon surgery   . Colon resection 1980's    "had colostomy bag for awhile; went back in and put it all back together"  . Colostomy takedown 1980's    History reviewed. No pertinent family history.  History  Substance Use Topics  . Smoking status: Never Smoker   . Smokeless tobacco: Never Used  . Alcohol Use: No      Review of Systems  Unable to perform ROS: Dementia  Respiratory: Positive for chest tightness. Negative for shortness of breath.   Gastrointestinal: Positive for abdominal pain.  Genitourinary: Negative for dysuria.    Allergies  Morphine and related  Home Medications   Current Outpatient Rx  Name Route Sig Dispense Refill  . ASPIRIN EC 81 MG PO TBEC Oral Take 81 mg by mouth daily.    . AZELASTINE HCL 137 MCG/SPRAY NA SOLN Nasal Place 2 sprays into the nose 2 (two) times daily. Use in each nostril as directed    . CARVEDILOL 12.5 MG PO TABS Oral Take 12.5 mg by mouth 2 (two) times daily with a meal.    . CYANOCOBALAMIN 1000 MCG/ML IJ SOLN Intramuscular Inject 1,000 mcg into the muscle every 30 (thirty) days. Give on the 24th of each month    .  DONEPEZIL HCL 10 MG PO TABS Oral Take 10 mg by mouth at bedtime.    . FUROSEMIDE 40 MG PO TABS Oral Take 40 mg by mouth daily.    . ISOSORBIDE DINITRATE 30 MG PO TABS Oral Take 15 mg by mouth daily.    Marland Kitchen LORATADINE 10 MG PO TABS Oral Take 10 mg by mouth daily.    Marland Kitchen RAMIPRIL 5 MG PO CAPS Oral Take 5 mg by mouth 2 (two) times daily.    . SERTRALINE HCL 25 MG PO TABS Oral Take 25 mg by mouth daily.    Marland Kitchen TAMSULOSIN HCL 0.4 MG PO CAPS Oral Take 0.4 mg by mouth at bedtime.    . WARFARIN SODIUM 2 MG PO TABS Oral Take 2 mg by mouth See admin instructions. Takes 2mg  on Monday and Friday    . WARFARIN SODIUM 3 MG PO TABS Oral Take 3 mg by mouth See admin instructions. Takes 3mg  on  Tuesday, Wednesday, Thursday, Saturday, and Sunday    . NITROGLYCERIN 0.4 MG SL SUBL Sublingual Place 0.4 mg under the tongue every 5 (five) minutes as needed. For chest pain    . SALINE NASAL SPRAY 0.65 % NA SOLN Nasal Place 2 sprays into the nose daily as needed. For nasal congestion      BP 85/40  Pulse 74  Temp 97.7 F (36.5 C) (Oral)  Resp 16  SpO2 96%  Physical Exam  Vitals reviewed. Constitutional:       Appears sleepy. Answers yes no questions appropriately.  HENT:  Head: Normocephalic and atraumatic.  Mouth/Throat: Oropharynx is clear and moist. No oropharyngeal exudate.  Eyes: EOM are normal. Pupils are equal, round, and reactive to light.  Neck: Neck supple.  Cardiovascular: Normal rate, regular rhythm and intact distal pulses.   Pulmonary/Chest: Effort normal and breath sounds normal. No respiratory distress. He has no wheezes. He has no rales.  Abdominal: Soft. Bowel sounds are normal. He exhibits distension. There is tenderness. There is no rebound and no guarding.       Significant LLQ tenderness. No rebound or peritoneal signs.   Musculoskeletal: He exhibits no edema and no tenderness.  Neurological: He is alert. He exhibits normal muscle tone. Coordination normal.  Skin: No rash noted.  Psychiatric: He has a normal mood and affect.    ED Course  Procedures (including critical care time)  Labs Reviewed  CBC - Abnormal; Notable for the following:    RBC 4.01 (*)     Hemoglobin 11.6 (*)     HCT 36.1 (*)     All other components within normal limits  PROTIME-INR - Abnormal; Notable for the following:    Prothrombin Time 41.3 (*)     INR 4.22 (*)     All other components within normal limits  COMPREHENSIVE METABOLIC PANEL - Abnormal; Notable for the following:    Sodium 134 (*)     Glucose, Bld 124 (*)     BUN 30 (*)     Creatinine, Ser 1.95 (*)     Albumin 3.0 (*)     GFR calc non Af Amer 31 (*)     GFR calc Af Amer 35 (*)     All other components  within normal limits  LIPASE, BLOOD  LACTIC ACID, PLASMA  URINALYSIS, ROUTINE W REFLEX MICROSCOPIC   No results found.   1. Diverticulitis of colon with perforation       MDM  78 yo male with CHF, afib on  coumadin, hx partial colectomy for recurrent diverticulitis who presents with diverticulitis and likely localized perforation/abscess formation shown on CT performed at outside facility. No acute abdomen currently. Plan to obtain labs, start IV cipro/flagyl, consult for admission. Gen surgery to consult.        Durwin Reges, MD 07/03/11 1620

## 2011-07-03 NOTE — Progress Notes (Addendum)
ANTIBIOTIC CONSULT NOTE - INITIAL  Pharmacy Consult for  Cipro  Indication:  Empiric coverage for Diverticulitis w/possible abscess   Allergies  Allergen Reactions  . Morphine And Related     Delusions   Patient Measurements: Height: 6\' 2"  (188 cm) Weight: 180 lb 4.8 oz (81.784 kg) IBW/kg (Calculated) : 82.2   Vital Signs: Temp: 98.4 F (36.9 C) (06/13 1842) Temp src: Oral (06/13 1842) BP: 160/88 mmHg (06/13 1842) Pulse Rate: 72  (06/13 1842)  Labs:  Basename 07/03/11 1410  WBC 10.3  HGB 11.6*  PLT 222  LABCREA --  CREATININE 1.95*   Estimated Creatinine Clearance: 34.4 ml/min (by C-G formula based on Cr of 1.95).  Medical History: Past Medical History  Diagnosis Date  . Hypertension   . CHF, left ventricular   . Paroxysmal atrial fibrillation   . CHF (congestive heart failure)   . Angina   . Myocardial infarction 2009  . Stroke 2011    residual:  "right sided weakness; problems walking; dementia"  . Dementia     S/P CVA 2011  . Arthritis    Medications:  Anti-infectives     Start     Dose/Rate Route Frequency Ordered Stop   07/03/11 2100   metroNIDAZOLE (FLAGYL) IVPB 500 mg        500 mg 100 mL/hr over 60 Minutes Intravenous Every 8 hours 07/03/11 1913     07/03/11 1915   ciprofloxacin (CIPRO) IVPB 200 mg        200 mg 100 mL/hr over 60 Minutes Intravenous Every 12 hours 07/03/11 1913     07/03/11 1545   ciprofloxacin (CIPRO) IVPB 400 mg        400 mg 200 mL/hr over 60 Minutes Intravenous  Once 07/03/11 1541 07/03/11 1734     Assessment: 78yo male admitted with complaints of abdominal pain.  He had a history of diverticulitis with colonic resection many years ago.  CT of the abdomen and pelvis revealed diverticulosis with abnormal gas and phlegmon collection as well as an abscess.  He is being placed on empiric antibiotics for coverage of abscess.  He has some mild renal insufficiency with creat. = 1.95 and an estimated clearance around 34 ml/min.       Goal of Therapy:  Therapeutic response to IV antibiotics Renal dose adjustments as needed  Plan:   Cipro 200 mg IV every 12 hours to start (reduced for renal function).  Will monitor creatinine and dose adjust accordingly.  Nadara Mustard, PharmD., MS Clinical Pharmacist Pager:  [phone]  Thank you for allowing pharmacy to be part of this patients care team.  07/03/2011,7:39 PM

## 2011-07-03 NOTE — Telephone Encounter (Signed)
Dr. Janee Morn paged to call Dr. Ignacia Palma @ [facility] ED [phone].

## 2011-07-03 NOTE — Consult Note (Signed)
I examined the patient and spoke with his wife. I also reviewed his CT scan results with the radiologist. There is a phlegmon/early abscess next to his left colon. It surrounds a portion of small bowel. The patient does have a history of perforated colonic diverticulitis in the past requiring colostomy. Colostomy takedown was performed many years ago. It is difficult to be certain if this process arises from the colon versus the small bowel. Plan bowel rest, IV antibiotics, hold Coumadin, and follow closely. If he does not improve, he may need surgery with possible ostomy. Plan was discussed in detail with the patient. I also spoke with Dr.Reddy who is admitting him to the hospitalist service and will be managing his multiple medical problems. Patient examined and I agree with the assessment and plan  Violeta Gelinas, MD, MPH, FACS Pager: [phone]  07/03/2011 5:24 PM

## 2011-07-03 NOTE — ED Provider Notes (Signed)
I saw and evaluated the patient, reviewed the resident's note and I agree with the findings and plan. 3:42 PM  Date: 07/03/2011  Rate: 62  Rhythm: atrial fibrillation and premature ventricular contractions (PVC)  QRS Axis: left  Intervals: normal QRS:  Poor R wave progression in precordial leads suggests old anterior myocardial infarction  ST/T Wave abnormalities: normal  Conduction Disutrbances:nonspecific intraventricular conduction delay  Narrative Interpretation: Abnormal EKG.   Old EKG Reviewed: unchanged   4:15 PM Case discussed with Internal Medicine --> General Surgery, with Dr. Violeta Gelinas, who advised that he would see the patient in consultation --> Internal Medicine to admit the patient to a telemetry bed, covering for Dr. Earl Gala.    Carleene Cooper III, MD 07/03/11 1549  Carleene Cooper III, MD 07/03/11 2130   Carleene Cooper III, MD 07/03/11 [phone]

## 2011-07-04 LAB — URINALYSIS, ROUTINE W REFLEX MICROSCOPIC
Bilirubin Urine: NEGATIVE
Ketones, ur: NEGATIVE mg/dL
Protein, ur: 100 mg/dL — AB
Urobilinogen, UA: 1 mg/dL (ref 0.0–1.0)

## 2011-07-04 LAB — BASIC METABOLIC PANEL
CO2: 21 mEq/L (ref 19–32)
Calcium: 8.3 mg/dL — ABNORMAL LOW (ref 8.4–10.5)
Creatinine, Ser: 1.53 mg/dL — ABNORMAL HIGH (ref 0.50–1.35)
GFR calc non Af Amer: 41 mL/min — ABNORMAL LOW (ref >90)
Sodium: 137 mEq/L (ref 135–145)

## 2011-07-04 LAB — CBC
MCV: 89.4 fL (ref 78.0–100.0)
Platelets: 204 10*3/uL (ref 150–400)
RDW: 13.9 % (ref 11.5–15.5)
WBC: 9 10*3/uL (ref 4.0–10.5)

## 2011-07-04 LAB — URINE MICROSCOPIC-ADD ON: Squamous Epithelial / LPF: NEGATIVE — AB

## 2011-07-04 MED ORDER — BIOTENE DRY MOUTH MT LIQD
15.0000 mL | Freq: Two times a day (BID) | OROMUCOSAL | Status: DC
  Administered 2011-07-04 – 2011-07-09 (×7): 15 mL via OROMUCOSAL

## 2011-07-04 MED ORDER — ISOSORBIDE MONONITRATE 15 MG HALF TABLET
15.0000 mg | ORAL_TABLET | Freq: Every day | ORAL | Status: DC
  Administered 2011-07-04 – 2011-07-09 (×6): 15 mg via ORAL
  Filled 2011-07-04 (×7): qty 1

## 2011-07-04 MED ORDER — HYDRALAZINE HCL 20 MG/ML IJ SOLN
10.0000 mg | Freq: Once | INTRAMUSCULAR | Status: AC
  Administered 2011-07-04: 10 mg via INTRAVENOUS
  Filled 2011-07-04: qty 0.5

## 2011-07-04 NOTE — Progress Notes (Signed)
  Subjective: Feeling significantly better, walked with PT this AM already and did well, less abdominal pain  Objective: Vital signs in last 24 hours: Temp:  [97.2 F (36.2 C)-98.4 F (36.9 C)] 98 F (36.7 C) (06/14 0414) Pulse Rate:  [43-106] 76  (06/14 0414) Resp:  [16-26] 20  (06/14 0414) BP: (85-163)/(40-99) 163/65 mmHg (06/14 0414) SpO2:  [96 %-99 %] 96 % (06/14 0414) Weight:  [81.784 kg (180 lb 4.8 oz)-82.1 kg (181 lb)] 82.1 kg (181 lb) (06/14 0414) Last BM Date: 07/01/11  Intake/Output from previous day: 06/13 0701 - 06/14 0700 In: 791.3 [I.V.:591.3; IV Piggyback:200] Out: 450 [Urine:450] Intake/Output this shift:    General appearance: alert and cooperative Resp: CTA plus minimal rales Cardio: irregularly irregular rhythm GI: soft, much less tenderness L abdomen, no guarding, few BS  Lab Results:   Basename 07/04/11 0510 07/03/11 1410  WBC 9.0 10.3  HGB 10.7* 11.6*  HCT 31.9* 36.1*  PLT 204 222   BMET  Basename 07/04/11 0510 07/03/11 1410  NA 137 134*  K 3.7 4.0  CL 101 97  CO2 21 23  GLUCOSE 87 124*  BUN 27* 30*  CREATININE 1.53* 1.95*  CALCIUM 8.3* 8.6   PT/INR  Basename 07/03/11 1410  LABPROT 41.3*  INR 4.22*   ABG No results found for this basename: PHART:2,PCO2:2,PO2:2,HCO3:2 in the last 72 hours  Studies/Results: No results found.  Anti-infectives: Anti-infectives     Start     Dose/Rate Route Frequency Ordered Stop   07/04/11 0800   ciprofloxacin (CIPRO) IVPB 200 mg        200 mg 100 mL/hr over 60 Minutes Intravenous Every 12 hours 07/03/11 1913     07/03/11 2100   metroNIDAZOLE (FLAGYL) IVPB 500 mg        500 mg 100 mL/hr over 60 Minutes Intravenous Every 8 hours 07/03/11 1913     07/03/11 2000   ciprofloxacin (CIPRO) IVPB 400 mg  Status:  Discontinued        400 mg 200 mL/hr over 60 Minutes Intravenous Every 12 hours 07/03/11 1935 07/03/11 1936   07/03/11 1545   ciprofloxacin (CIPRO) IVPB 400 mg        400 mg 200 mL/hr  over 60 Minutes Intravenous  Once 07/03/11 1541 07/03/11 1734   07/03/11 1545   metroNIDAZOLE (FLAGYL) IVPB 500 mg  Status:  Discontinued        500 mg 100 mL/hr over 60 Minutes Intravenous  Once 07/03/11 1541 07/03/11 1941          Assessment/Plan: s/p * No surgery found * Contained diverticular perforation colon/SB with phlegmon - significantly improved this AM on ABX and bowel rest, continue only sips with meds ID - Cipro/Flagyl A-fib with anticoagulation - holding coumadin in case worsens and needs surgery for above, per primary service I spoke to the patient's son at length regarding the plan of care and answered his questions    LOS: 1 day    Taquila Leys E 07/04/2011

## 2011-07-04 NOTE — Evaluation (Signed)
Physical Therapy Evaluation Patient Details Name: Trevor Webb MRN: [id number] DOB: 04-14-1928 Today's Date: 07/04/2011 Time: 0840-0900 PT Time Calculation (min): 20 min  PT Assessment / Plan / Recommendation Clinical Impression  Pt adm for diverticulosis.  Pt from SNF.  Recommend PT once pt returns to SNF.  Needs skilled PT to maximize I and safety to maintain quality of life    PT Assessment  Patient needs continued PT services    Follow Up Recommendations  Skilled nursing facility    Barriers to Discharge        lEquipment Recommendations  None recommended by PT    Recommendations for Other Services     Frequency Min 3X/week    Precautions / Restrictions Precautions Precautions: Fall Required Braces or Orthoses: Other Brace/Splint (orthopedic shoe to equal out leg lengths.) Other Brace/Splint: orthopedic shoes to make RLE the same lenght as LLE. Restrictions Weight Bearing Restrictions: No   Pertinent Vitals/Pain N/A      Mobility  Bed Mobility Bed Mobility: Supine to Sit;Sitting - Scoot to Edge of Bed Supine to Sit: 3: Mod assist;HOB elevated;With rails Sitting - Scoot to Edge of Bed: 4: Min assist;With rail Details for Bed Mobility Assistance: Pt with heavy posterior lean due to old R hip replacement that makes bed mobility difficult. Transfers Sit to Stand: 3: Mod assist;With upper extremity assist;From bed Stand to Sit: 4: Min assist;With upper extremity assist;With armrests;To chair/3-in-1 Details for Transfer Assistance: Pt needs mod assist from regular surface height.  Pt may need max assist from lower surface due to posterior lean when sitting.  Pt has difficult time getting nose over toes to stand. Ambulation/Gait Ambulation/Gait Assistance: 4: Min assist Assistive device: Rolling walker Ambulation/Gait Assistance Details: Pt with short RLE due to hip replacement complications. Usually has built up shoe on rt. Gait Pattern: Step-to pattern;Decreased  dorsiflexion - right;Right hip hike Gait velocity: Slow pace    Exercises     PT Diagnosis: Difficulty walking;Hemiplegia dominant side  PT Problem List: Decreased strength;Decreased balance;Decreased mobility PT Treatment Interventions: DME instruction;Gait training;Functional mobility training;Therapeutic activities;Therapeutic exercise;Balance training;Patient/family education   PT Goals Acute Rehab PT Goals PT Goal Formulation: With patient Time For Goal Achievement: 07/04/11 Potential to Achieve Goals: Good Pt will go Supine/Side to Sit: with min assist PT Goal: Supine/Side to Sit - Progress: Goal set today Pt will go Sit to Supine/Side: with min assist PT Goal: Sit to Supine/Side - Progress: Goal set today Pt will go Sit to Stand: with min assist PT Goal: Sit to Stand - Progress: Goal set today Pt will go Stand to Sit: with min assist PT Goal: Stand to Sit - Progress: Goal set today Pt will Ambulate: with supervision;51 - 150 feet PT Goal: Ambulate - Progress: Goal set today  Visit Information  Last PT Received On: 07/04/11 Assistance Needed: +1    Subjective Data  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." Patient Stated Goal: Continue to mobilize   Prior Functioning  Home Living Lives With: Other (Comment) (at [facility] [employer] in [place]) Available Help at Discharge: Skilled Nursing Facility Type of Home: Skilled Nursing Facility Home Access: Level entry Home Layout: One level Bathroom Shower/Tub: Walk-in shower;Curtain Bathroom Toilet: Handicapped height Bathroom Accessibility: Yes How Accessible: Accessible via wheelchair;Accessible via walker Home Adaptive Equipment: Walker - rolling;Wheelchair - manual Prior Function Level of Independence: Needs assistance Needs Assistance: Bathing;Dressing;Toileting;Gait;Transfers Bath: Maximal Dressing: Maximal Grooming: Minimal Toileting: Minimal Meal Prep: Total Light Housekeeping: Total Gait Assistance: Min A with RW  in halls.  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  Pt at baseline.    Extremity/Trunk Assessment Right Upper Extremity Assessment RUE ROM/Strength/Tone: Deficits RUE ROM/Strength/Tone Deficits: Pt with old CVA. Overall strength 3/5. RUE Coordination: Deficits RUE Coordination Deficits: PT with old CVA therefore coordination impaired in RUE Left Upper Extremity Assessment LUE ROM/Strength/Tone: Within functional levels LUE Sensation: WFL - Light Touch LUE Coordination: WFL - gross/fine motor Right Lower Extremity Assessment RLE ROM/Strength/Tone: Deficits RLE ROM/Strength/Tone Deficits: Hip ROM limited and unable to flex past ~60 degees. Strength grossly 2+/5 Left Lower Extremity Assessment LLE ROM/Strength/Tone: WFL for tasks assessed   Balance Balance Balance Assessed: No Static Standing Balance Static Standing - Balance Support: Bilateral upper extremity supported (on walker) Static Standing - Level of Assistance: 4: Min assist  End of Session PT - End of Session Equipment Utilized During Treatment: Gait belt Activity Tolerance: Patient tolerated treatment well Patient left: in chair;with call bell/phone within reach;with family/visitor present Nurse Communication: Mobility status   Trevor Webb 07/04/2011, 9:37 AM  [facility] PT [phone]

## 2011-07-04 NOTE — Progress Notes (Signed)
Subjective: Feels a bit better. Abd does not hurt as much. Is not very hungry at this point he states  Objective:  Vital Signs: Filed Vitals:   07/03/11 1730 07/03/11 1842 07/03/11 2127 07/04/11 0414  BP: 129/55 160/88 159/99 163/65  Pulse: 43 72 84 76  Temp:  98.4 F (36.9 C) 97.2 F (36.2 C) 98 F (36.7 C)  TempSrc:  Oral Oral Oral  Resp: 19 20 20 20   Height:  6\' 2"  (1.88 m)    Weight:  81.784 kg (180 lb 4.8 oz)  82.1 kg (181 lb)  SpO2: 98% 96% 98% 96%     EXAM: ABD: soft, mild to mod LLQ tenderness.    Intake/Output Summary (Last 24 hours) at 07/04/11 0648 Last data filed at 07/04/11 0600  Gross per 24 hour  Intake 791.25 ml  Output    450 ml  Net 341.25 ml    Lab Results:  Basename 07/03/11 1410  NA 134*  K 4.0  CL 97  CO2 23  GLUCOSE 124*  BUN 30*  CREATININE 1.95*  CALCIUM 8.6  MG --  PHOS --    Basename 07/03/11 1410  AST 20  ALT 18  ALKPHOS 103  BILITOT 0.7  PROT 6.6  ALBUMIN 3.0*    Basename 07/03/11 1410  LIPASE 27  AMYLASE --    Basename 07/04/11 0510 07/03/11 1410  WBC 9.0 10.3  NEUTROABS -- --  HGB 10.7* 11.6*  HCT 31.9* 36.1*  MCV 89.4 90.0  PLT 204 222   No results found for this basename: CKTOTAL:3,CKMB:3,CKMBINDEX:3,TROPONINI:3 in the last 72 hours No components found with this basename: POCBNP:3 No results found for this basename: DDIMER:2 in the last 72 hours No results found for this basename: HGBA1C:2 in the last 72 hours No results found for this basename: CHOL:2,HDL:2,LDLCALC:2,TRIG:2,CHOLHDL:2,LDLDIRECT:2 in the last 72 hours No results found for this basename: TSH,T4TOTAL,FREET3,T3FREE,THYROIDAB in the last 72 hours No results found for this basename: VITAMINB12:2,FOLATE:2,FERRITIN:2,TIBC:2,IRON:2,RETICCTPCT:2 in the last 72 hours  Studies/Results: No results found. Medications: Scheduled Meds:   . antiseptic oral rinse  15 mL Mouth Rinse BID  . aspirin EC  81 mg Oral Daily  . carvedilol  12.5 mg Oral BID WC    . ciprofloxacin  200 mg Intravenous Q12H  . ciprofloxacin  400 mg Intravenous Once  . cyanocobalamin  1,000 mcg Intramuscular Q30 days  . donepezil  10 mg Oral QHS  . isosorbide dinitrate  15 mg Oral Daily  . loratadine  10 mg Oral Daily  . metronidazole  500 mg Intravenous Q8H  . sertraline  25 mg Oral Daily  . Tamsulosin HCl  0.4 mg Oral QHS  . DISCONTD: ciprofloxacin  400 mg Intravenous Q12H  . DISCONTD: metronidazole  500 mg Intravenous Once   Continuous Infusions:   . sodium chloride 75 mL/hr at 07/03/11 2207  . DISCONTD: sodium chloride     PRN Meds:.acetaminophen, acetaminophen, azelastine, nitroGLYCERIN, ondansetron (ZOFRAN) IV, ondansetron, sodium chloride, DISCONTD: sodium chloride  Assessment/Plan: Principal Problem:  *Diverticulitis - improving clinically. Appreciate Dr. Carollee Massed help. Hopefully will avoid surgery. Continue IV abx for now Active Problems:  ARF (acute renal failure) - mild bump in creatinine. Will follow.   Anemia - this is mild. Don't plan much eval unless persists after he is well.   CHF, left ventricular - compensated. Stop O2 if O2 sat stays up.   Dementia  CKD (chronic kidney disease), stage II  Hypertension  Paroxysmal atrial fibrillation  - NSR today  Myocardial infarction  Stroke  Arthritis  Chronic anticoagulation - Warfarin held. INR up. Will let this drift down for now as he may need procedures/surgery.    LOS: 1 day   Trevor Webb CHARLES 07/04/2011, 6:48 AM

## 2011-07-04 NOTE — Clinical Social Work Psychosocial (Signed)
     Clinical Social Work Department BRIEF PSYCHOSOCIAL ASSESSMENT 07/04/2011  Patient:  Trevor Webb, Trevor Webb     Account Number:  [id number]     Admit date:  07/03/2011  Clinical Social Worker:  Jacelyn Grip  Date/Time:  07/04/2011 11:00 AM  Referred by:  Physician  Date Referred:  07/04/2011 Referred for  SNF Placement   Other Referral:   Interview type:  Patient Other interview type:    PSYCHOSOCIAL DATA Living Status:  FACILITY Admitted from facility:  [facility] Level of care:  Skilled Nursing Facility Primary support name:  [name] Lahm/[phone] Primary support relationship to patient:  CHILD, ADULT Degree of support available:   strong, pt son at bedside    CURRENT CONCERNS Current Concerns  Post-Acute Placement   Other Concerns:    SOCIAL WORK ASSESSMENT / PLAN CSW met with pt and pt son at bedside to discuss disposition planning. Pt stated that he is currently a resident at [place] [facility]. Pt confirmed that plan is to return ot [facility] when medically stable for discharge. Pt son stated that pt son would plan to transport pt via car back to SNF when pt medically stable for discharge. CSW contacted [facility] and left voicemessage for admission coordinator. CSW to follow up with [facility] and send pt clinical information. CSW to facilitate pt discharge needs when pt medically stable for discharge.   Assessment/plan status:  Psychosocial Support/Ongoing Assessment of Needs Other assessment/ plan:   discharge planning   Information/referral to community resources:   Referral back to [facility]    PATIENTS/FAMILYS RESPONSE TO PLAN OF CARE: Pt alert and oriented with moderate h/o dementia. Pt son at bedside and supportive and actively involved in pt care. Pt reports that he has been a resident at [facility] [place] for two years and is satisfied with the care there.

## 2011-07-04 NOTE — Care Management Note (Signed)
    Page 1 of 1   07/10/2011     5:16:13 PM   CARE MANAGEMENT NOTE 07/10/2011  Patient:  Trevor Webb, Trevor Webb   Account Number:  [id number]  Date Initiated:  07/04/2011  Documentation initiated by:  Letha Cape  Subjective/Objective Assessment:   dx diverticulitis  admit- from [facility] SNF     Action/Plan:   pt eval- rec snf.   Anticipated DC Date:  07/08/2011   Anticipated DC Plan:  SKILLED NURSING FACILITY  In-house referral  Clinical Social Worker      DC Planning Services  CM consult      Choice offered to / List presented to:             Status of service:  Completed, signed off Medicare Important Message given?   (If response is "NO", the following Medicare IM given date fields will be blank) Date Medicare IM given:   Date Additional Medicare IM given:    Discharge Disposition:  SKILLED NURSING FACILITY  Per UR Regulation:  Reviewed for med. necessity/level of care/duration of stay  If discussed at Long Length of Stay Meetings, dates discussed:    Comments:  07/07/11 18:24 Letha Cape RN, BSN [phone] ct scan shows confined abscess, likely small bowel diverticulum.  Patient has multiple risk factors for surgery so will try non surgical drainage first.  Patient's inr is also elevated, will give vit k and recheck inr in am.  Plan is for snf when medically stable.  07/04/11 16:57 Letha Cape RN, BSN [phone] patient is from [facility] in [place].  Plan is to return,  CSW referral.  Per pt recs snf.

## 2011-07-04 NOTE — Evaluation (Signed)
Occupational Therapy One Time Evaluation Patient Details Name: MAYJOR AGER MRN: [id number] DOB: 10-12-1928 Today's Date: 07/04/2011 Time: [id number] OT Time Calculation (min): 21 min  OT Assessment / Plan / Recommendation Clinical Impression  Pt admitted for abdominal pain and diverticulosis but is at baseline for all basic adls that he was completing before admit.  Pt not in need of further acute OT services.    OT Assessment  Patient does not need any further OT services    Follow Up Recommendations  Supervision/Assistance - 24 hour;Skilled nursing facility;Other (comment) (Return to [facility].)    Barriers to Discharge      Equipment Recommendations  None recommended by OT    Recommendations for Other Services    Frequency       Precautions / Restrictions Precautions Precautions: Fall Required Braces or Orthoses: Other Brace/Splint (orthopedic shoe to equal out leg lengths.) Other Brace/Splint: orthopedic shoes to make RLE the same lenght as LLE. Restrictions Weight Bearing Restrictions: No   Pertinent Vitals/Pain No pain reported    ADL  Eating/Feeding: Simulated;Set up Where Assessed - Eating/Feeding: Chair Grooming: Simulated;Wash/dry hands;Shaving;Wash/dry face;Set up Where Assessed - Grooming: Supported sitting Upper Body Bathing: Simulated;Maximal assistance Where Assessed - Upper Body Bathing: Supported sit to stand Lower Body Bathing: Simulated;Maximal assistance Where Assessed - Lower Body Bathing: Supported sit to stand Upper Body Dressing: Simulated;+1 Total assistance Where Assessed - Upper Body Dressing: Supported sitting Lower Body Dressing: Simulated;+1 Total assistance Where Assessed - Lower Body Dressing: Sopported sit to stand Toilet Transfer: Performed;Moderate assistance Toilet Transfer Method: Stand pivot Acupuncturist: Bedside commode Toileting - Clothing Manipulation and Hygiene: Simulated;Minimal assistance Where Assessed  - Engineer, mining and Hygiene: Sit to stand from 3-in-1 or toilet Equipment Used: Rolling walker Transfers/Ambulation Related to ADLs: Pt transferred sit to stand with mod assist.  Pt with posterior pelvic tilt secondary to old R hip replacment.  Pt walked in hallway and for adls with min guard. ADL Comments: Pt fairly dependent on [facility] staff for most adls.  Pt at or close to baseline with adls.    OT Diagnosis:    OT Problem List:   OT Treatment Interventions:     OT Goals    Visit Information  Last OT Received On: 07/04/11 Assistance Needed: +2 PT/OT Co-Evaluation/Treatment: Yes    Subjective Data  Subjective: "I feel okay today." Patient Stated Goal: To return to [facility]   Prior Functioning  Home Living Lives With: Other (Comment) (at [facility]) Available Help at Discharge: Skilled Nursing Facility Type of Home: Skilled Nursing Facility Home Access: Level entry Home Layout: One level Bathroom Shower/Tub: Walk-in shower;Curtain Bathroom Toilet: Handicapped height Bathroom Accessibility: Yes How Accessible: Accessible via wheelchair;Accessible via walker Home Adaptive Equipment: Walker - rolling;Wheelchair - manual Prior Function Level of Independence: Needs assistance Needs Assistance: Bathing;Dressing;Toileting;Gait;Transfers Bath: Maximal Dressing: Maximal Grooming: Minimal Toileting: Minimal Meal Prep: Total Light Housekeeping: Total Gait Assistance: Min A with RW in halls.  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  Pt at baseline.    Extremity/Trunk Assessment Right Upper Extremity Assessment  RUE ROM/Strength/Tone: Deficits RUE ROM/Strength/Tone Deficits: Pt with old CVA. Overall strength 3/5. RUE Coordination: Deficits RUE Coordination Deficits: PT with old CVA therefore coordination impaired in RUE Left Upper Extremity Assessment LUE ROM/Strength/Tone: Within functional levels LUE Sensation: WFL - Light Touch LUE Coordination: WFL - gross/fine motor   Mobility Bed Mobility Bed Mobility: Supine to Sit;Sitting - Scoot to Edge of Bed Supine to Sit: 3: Mod assist;HOB elevated;With rails Sitting - Scoot to Edge of Bed: 4: Min assist;With rail Details for Bed Mobility Assistance: Pt with heavy posterior lean due to old R hip replacement that makes bed mobility difficult. Transfers Transfers: Sit to Stand;Stand to Sit Sit to Stand: 3: Mod assist;With upper extremity assist;From bed Stand to Sit: 4: Min assist;With upper extremity assist;With armrests;To chair/3-in-1 Details for Transfer Assistance: Pt needs mod assist from regular surface height.  Pt may need max assist from lower surface due to posterior lean when sitting.  Pt has difficult time getting nose over toes to stand.   Exercise    Balance Balance Balance Assessed: No  End of Session OT - End of Session Equipment Utilized During Treatment: Gait belt Activity Tolerance: Patient tolerated treatment well Patient left: in chair;with call bell/phone within reach;with family/visitor present Nurse Communication: Mobility status   Hope Budds 07/04/2011, 9:17 AM [phone]

## 2011-07-05 LAB — DIFFERENTIAL
Basophils Absolute: 0.1 10*3/uL (ref 0.0–0.1)
Basophils Relative: 1 % (ref 0–1)
Eosinophils Absolute: 0.1 10*3/uL (ref 0.0–0.7)
Eosinophils Relative: 1 % (ref 0–5)
Lymphocytes Relative: 17 % (ref 12–46)
Lymphs Abs: 1.4 10*3/uL (ref 0.7–4.0)
Monocytes Absolute: 0.6 10*3/uL (ref 0.1–1.0)
Monocytes Relative: 8 % (ref 3–12)
Neutro Abs: 5.7 10*3/uL (ref 1.7–7.7)
Neutrophils Relative %: 73 % (ref 43–77)

## 2011-07-05 LAB — BASIC METABOLIC PANEL
BUN: 21 mg/dL (ref 6–23)
CO2: 24 mEq/L (ref 19–32)
Calcium: 8.5 mg/dL (ref 8.4–10.5)
Chloride: 102 mEq/L (ref 96–112)
Creatinine, Ser: 1.26 mg/dL (ref 0.50–1.35)
GFR calc Af Amer: 60 mL/min — ABNORMAL LOW (ref >90)
GFR calc non Af Amer: 52 mL/min — ABNORMAL LOW (ref >90)
Glucose, Bld: 91 mg/dL (ref 70–99)
Potassium: 3.5 mEq/L (ref 3.5–5.1)
Sodium: 140 mEq/L (ref 135–145)

## 2011-07-05 LAB — PROTIME-INR
INR: 4.61 — ABNORMAL HIGH (ref 0.00–1.49)
Prothrombin Time: 44.2 seconds — ABNORMAL HIGH (ref 11.6–15.2)

## 2011-07-05 LAB — CBC
HCT: 33.3 % — ABNORMAL LOW (ref 39.0–52.0)
Hemoglobin: 11 g/dL — ABNORMAL LOW (ref 13.0–17.0)
MCH: 29.3 pg (ref 26.0–34.0)
MCHC: 33 g/dL (ref 30.0–36.0)
MCV: 88.8 fL (ref 78.0–100.0)
Platelets: 245 10*3/uL (ref 150–400)
RBC: 3.75 MIL/uL — ABNORMAL LOW (ref 4.22–5.81)
RDW: 13.8 % (ref 11.5–15.5)
WBC: 7.9 10*3/uL (ref 4.0–10.5)

## 2011-07-05 NOTE — Progress Notes (Signed)
Pt with multifocal PVCs and intermittent periods of bradycardia (38-40). Asymptomatic. Discussed with Dr. Valentina Lucks. Will continue to monitor.

## 2011-07-05 NOTE — Progress Notes (Signed)
Subjective: No change  Objective: Vital signs in last 24 hours: Temp:  [97.4 F (36.3 C)-98.1 F (36.7 C)] 97.4 F (36.3 C) (06/15 0520) Pulse Rate:  [43-73] 43  (06/15 0520) Resp:  [18-20] 20  (06/15 0520) BP: (136-173)/(60-90) 140/86 mmHg (06/15 0557) SpO2:  [96 %-97 %] 97 % (06/15 0520) Weight:  [80 kg (176 lb 5.9 oz)] 80 kg (176 lb 5.9 oz) (06/15 0520) Weight change: -1.783 kg (-3 lb 14.9 oz) Last BM Date: 07/01/11  Intake/Output from previous day: 06/14 0701 - 06/15 0700 In: 0  Out: 480 [Urine:480] Intake/Output this shift:    General appearance: alert Resp: clear to auscultation bilaterally Cardio: regular rate and rhythm, S1, S2 normal, no murmur, click, rub or gallop GI: abnormal findings:  tender LLQ without rebound  Lab Results:  Basename 07/05/11 0520 07/04/11 0510  WBC 7.9 9.0  HGB 11.0* 10.7*  HCT 33.3* 31.9*  PLT 245 204   BMET  Basename 07/05/11 0520 07/04/11 0510  NA 140 137  K 3.5 3.7  CL 102 101  CO2 24 21  GLUCOSE 91 87  BUN 21 27*  CREATININE 1.26 1.53*  CALCIUM 8.5 8.3*    Studies/Results: No results found.  Medications: I have reviewed the patient's current medications.  Assessment/Plan: Principal Problem:  *Diverticulitis  Doing well, one more day IV antibiotics Active Problems:  Hypertension OK  CHF, left ventricular stable  Paroxysmal atrial fibrillation  Myocardial infarction  Stroke  Dementia  Arthritis  ARF (acute renal failure) improved  Chronic anticoagulation holding coumadin  CKD (chronic kidney disease), stage II  Anemia stable   LOS: 2 days   Trevor Webb [name] 07/05/2011, 10:23 AM

## 2011-07-05 NOTE — Progress Notes (Signed)
  Subjective: Feels better.  Tolerating diet. No complaints  Objective: Vital signs in last 24 hours: Temp:  [97.4 F (36.3 C)-98.1 F (36.7 C)] 97.4 F (36.3 C) (06/15 0520) Pulse Rate:  [43-73] 43  (06/15 0520) Resp:  [18-20] 20  (06/15 0520) BP: (136-173)/(60-90) 140/86 mmHg (06/15 0557) SpO2:  [96 %-97 %] 97 % (06/15 0520) Weight:  [176 lb 5.9 oz (80 kg)] 176 lb 5.9 oz (80 kg) (06/15 0520) Last BM Date: 07/01/11  Intake/Output from previous day: 06/14 0701 - 06/15 0700 In: 0  Out: 480 [Urine:480] Intake/Output this shift:    General appearance: alert, cooperative and no distress GI: soft, non-tender; bowel sounds normal; no masses,  no organomegaly and no peritoneal signs and nondistended  Lab Results:   [facility] 07/05/11 0520 07/04/11 0510  WBC 7.9 9.0  HGB 11.0* 10.7*  HCT 33.3* 31.9*  PLT 245 204   BMET  Basename 07/05/11 0520 07/04/11 0510  NA 140 137  K 3.5 3.7  CL 102 101  CO2 24 21  GLUCOSE 91 87  BUN 21 27*  CREATININE 1.26 1.53*  CALCIUM 8.5 8.3*   PT/INR  Basename 07/05/11 0520 07/03/11 1410  LABPROT 44.2* 41.3*  INR 4.61* 4.22*   ABG No results found for this basename: PHART:2,PCO2:2,PO2:2,HCO3:2 in the last 72 hours  Studies/Results: No results found.  Anti-infectives: Anti-infectives     Start     Dose/Rate Route Frequency Ordered Stop   07/04/11 0800   ciprofloxacin (CIPRO) IVPB 200 mg        200 mg 100 mL/hr over 60 Minutes Intravenous Every 12 hours 07/03/11 1913     07/03/11 2100   metroNIDAZOLE (FLAGYL) IVPB 500 mg        500 mg 100 mL/hr over 60 Minutes Intravenous Every 8 hours 07/03/11 1913     07/03/11 2000   ciprofloxacin (CIPRO) IVPB 400 mg  Status:  Discontinued        400 mg 200 mL/hr over 60 Minutes Intravenous Every 12 hours 07/03/11 1935 07/03/11 1936   07/03/11 1545   ciprofloxacin (CIPRO) IVPB 400 mg        400 mg 200 mL/hr over 60 Minutes Intravenous  Once 07/03/11 1541 07/03/11 1734   07/03/11 1545    metroNIDAZOLE (FLAGYL) IVPB 500 mg  Status:  Discontinued        500 mg 100 mL/hr over 60 Minutes Intravenous  Once 07/03/11 1541 07/03/11 1941          Assessment/Plan: s/p * No surgery found * appears to be doing well.  No complaints and no abdominal pain.  no evidence of need for surgery at this time.  whatever the source of his inflammatory changes seems to be resolving.  LOS: 2 days    Trevor Webb DAVID 07/05/2011

## 2011-07-05 NOTE — Plan of Care (Signed)
Problem: Phase I Progression Outcomes Goal: Initial discharge plan identified Outcome: Completed/Met Date Met:  07/05/11 Back to [employer]

## 2011-07-06 MED ORDER — METRONIDAZOLE 500 MG PO TABS
500.0000 mg | ORAL_TABLET | Freq: Three times a day (TID) | ORAL | Status: DC
  Administered 2011-07-06 – 2011-07-09 (×11): 500 mg via ORAL
  Filled 2011-07-06 (×13): qty 1

## 2011-07-06 MED ORDER — CIPROFLOXACIN HCL 500 MG PO TABS
500.0000 mg | ORAL_TABLET | Freq: Two times a day (BID) | ORAL | Status: DC
  Administered 2011-07-06 – 2011-07-09 (×7): 500 mg via ORAL
  Filled 2011-07-06 (×8): qty 1

## 2011-07-06 NOTE — Progress Notes (Signed)
Subjective: Less abdominal pain.  Hungry  Objective: Vital signs in last 24 hours: Temp:  [97.3 F (36.3 C)-98.3 F (36.8 C)] 97.3 F (36.3 C) (06/16 0525) Pulse Rate:  [37-70] 70  (06/16 0853) Resp:  [17-18] 17  (06/16 0525) BP: (130-180)/(68-91) 180/91 mmHg (06/16 0853) SpO2:  [95 %-97 %] 95 % (06/16 0525) Weight:  [81 kg (178 lb 9.2 oz)] 81 kg (178 lb 9.2 oz) (06/16 0525) Weight change: 1 kg (2 lb 3.3 oz) Last BM Date: 07/01/11  Intake/Output from previous day: 06/15 0701 - 06/16 0700 In: 881 [IV Piggyback:881] Out: 525 [Urine:525] Intake/Output this shift:    General appearance: alert and cooperative Resp: clear to auscultation bilaterally Cardio: regular rate and rhythm, S1, S2 normal, no murmur, click, rub or gallop GI: mild LLQ tenderness Extremities: extremities normal, atraumatic, no cyanosis or edema  Lab Results:  [facility] 07/05/11 0520 07/04/11 0510  WBC 7.9 9.0  HGB 11.0* 10.7*  HCT 33.3* 31.9*  PLT 245 204   BMET  Basename 07/05/11 0520 07/04/11 0510  NA 140 137  K 3.5 3.7  CL 102 101  CO2 24 21  GLUCOSE 91 87  BUN 21 27*  CREATININE 1.26 1.53*  CALCIUM 8.5 8.3*    Studies/Results: No results found.  Medications: I have reviewed the patient's current medications.  Assessment/Plan: Principal Problem:  *Diverticulitis improved, change to po antibiotics and start low residue diet.  OOB, PT consult at request of wife Active Problems:  Hypertension variable, follow  CHF, left ventricular compensated  Paroxysmal atrial fibrillation  Myocardial infarction  Stroke  Dementia  Arthritis  ARF (acute renal failure) improved  Chronic anticoagulation coumadin on hold, check PT  CKD (chronic kidney disease), stage II     LOS: 3 days   Trevor Webb Trevor Webb 07/06/2011, 9:14 AM

## 2011-07-06 NOTE — Progress Notes (Signed)
  Subjective Thirsty, but otherwise no complaints  Objective: Vital signs in last 24 hours: Temp:  [97.3 F (36.3 C)-98.3 F (36.8 C)] 97.3 F (36.3 C) (06/16 0525) Pulse Rate:  [37-50] 43  (06/16 0525) Resp:  [17-18] 17  (06/16 0525) BP: (130-176)/(68-84) 176/82 mmHg (06/16 0525) SpO2:  [95 %-97 %] 95 % (06/16 0525) Weight:  [178 lb 9.2 oz (81 kg)] 178 lb 9.2 oz (81 kg) (06/16 0525) Last BM Date: 07/01/11  Intake/Output from previous day: 06/15 0701 - 06/16 0700 In: 881 [IV Piggyback:881] Out: 525 [Urine:525] Intake/Output this shift:    General appearance: alert, cooperative and no distress GI: soft, minimal if any LLQ tenderness, ND, no peritoneal signs  Lab Results:   [facility] 07/05/11 0520 07/04/11 0510  WBC 7.9 9.0  HGB 11.0* 10.7*  HCT 33.3* 31.9*  PLT 245 204   BMET  Basename 07/05/11 0520 07/04/11 0510  NA 140 137  K 3.5 3.7  CL 102 101  CO2 24 21  GLUCOSE 91 87  BUN 21 27*  CREATININE 1.26 1.53*  CALCIUM 8.5 8.3*   PT/INR  Basename 07/05/11 0520 07/03/11 1410  LABPROT 44.2* 41.3*  INR 4.61* 4.22*   ABG No results found for this basename: PHART:2,PCO2:2,PO2:2,HCO3:2 in the last 72 hours  Studies/Results: No results found.  Anti-infectives: Anti-infectives     Start     Dose/Rate Route Frequency Ordered Stop   07/04/11 0800   ciprofloxacin (CIPRO) IVPB 200 mg        200 mg 100 mL/hr over 60 Minutes Intravenous Every 12 hours 07/03/11 1913     07/03/11 2100   metroNIDAZOLE (FLAGYL) IVPB 500 mg        500 mg 100 mL/hr over 60 Minutes Intravenous Every 8 hours 07/03/11 1913     07/03/11 2000   ciprofloxacin (CIPRO) IVPB 400 mg  Status:  Discontinued        400 mg 200 mL/hr over 60 Minutes Intravenous Every 12 hours 07/03/11 1935 07/03/11 1936   07/03/11 1545   ciprofloxacin (CIPRO) IVPB 400 mg        400 mg 200 mL/hr over 60 Minutes Intravenous  Once 07/03/11 1541 07/03/11 1734   07/03/11 1545   metroNIDAZOLE (FLAGYL) IVPB 500 mg   Status:  Discontinued        500 mg 100 mL/hr over 60 Minutes Intravenous  Once 07/03/11 1541 07/03/11 1941          Assessment/Plan: s/p * No surgery found * Trial advancing diet.  Abdominal complaints nearly resolved.  LOS: 3 days    Lodema Pilot DAVID 07/06/2011

## 2011-07-07 ENCOUNTER — Inpatient Hospital Stay (HOSPITAL_COMMUNITY): Payer: Medicare Other

## 2011-07-07 LAB — BASIC METABOLIC PANEL
Chloride: 106 mEq/L (ref 96–112)
Creatinine, Ser: 1.22 mg/dL (ref 0.50–1.35)
GFR calc Af Amer: 62 mL/min — ABNORMAL LOW (ref >90)
Potassium: 3.4 mEq/L — ABNORMAL LOW (ref 3.5–5.1)

## 2011-07-07 LAB — PROTIME-INR
INR: 3.12 — ABNORMAL HIGH (ref 0.00–1.49)
Prothrombin Time: 32.6 seconds — ABNORMAL HIGH (ref 11.6–15.2)

## 2011-07-07 MED ORDER — FUROSEMIDE 40 MG PO TABS
40.0000 mg | ORAL_TABLET | Freq: Every day | ORAL | Status: DC
  Administered 2011-07-07 – 2011-07-09 (×3): 40 mg via ORAL
  Filled 2011-07-07 (×4): qty 1

## 2011-07-07 MED ORDER — IOHEXOL 300 MG/ML  SOLN
20.0000 mL | INTRAMUSCULAR | Status: AC
  Administered 2011-07-07 (×2): 20 mL via ORAL

## 2011-07-07 MED ORDER — VITAMIN K1 10 MG/ML IJ SOLN
5.0000 mg | Freq: Once | INTRAVENOUS | Status: AC
  Administered 2011-07-07: 5 mg via INTRAVENOUS
  Filled 2011-07-07: qty 0.5

## 2011-07-07 MED ORDER — RAMIPRIL 5 MG PO CAPS
5.0000 mg | ORAL_CAPSULE | Freq: Two times a day (BID) | ORAL | Status: DC
  Administered 2011-07-07 – 2011-07-09 (×5): 5 mg via ORAL
  Filled 2011-07-07 (×8): qty 1

## 2011-07-07 MED ORDER — IOHEXOL 300 MG/ML  SOLN
80.0000 mL | Freq: Once | INTRAMUSCULAR | Status: AC | PRN
  Administered 2011-07-07: 80 mL via INTRAVENOUS

## 2011-07-07 NOTE — Progress Notes (Signed)
Call from Radiology - abscess is present but seems to be smaller and possibly connecting to the small bowel. I will alert Dr. Jamey Ripa to these findings.

## 2011-07-07 NOTE — Progress Notes (Signed)
Patient ID: Trevor Webb, male   DOB: 01-Feb-1928, 78 y.o.   MRN: [id number]    Subjective Feels better today, tolerating regular diet with only minimal abd pain.  +BMs, denies n/v.  Wants to go home today.  Objective: Vital signs in last 24 hours: Temp:  [97.4 F (36.3 C)-98.4 F (36.9 C)] 97.4 F (36.3 C) (06/17 4696) Pulse Rate:  [60-70] 62  (06/17 0611) Resp:  [17-18] 17  (06/17 0611) BP: (155-172)/(80-87) 172/87 mmHg (06/17 0611) SpO2:  [96 %-98 %] 96 % (06/17 0611) Weight:  [184 lb 4.9 oz (83.6 kg)] 184 lb 4.9 oz (83.6 kg) (06/17 0611) Last BM Date: 07/06/11  Intake/Output from previous day: 06/16 0701 - 06/17 0700 In: 480 [P.O.:480] Out: 625 [Urine:625] Intake/Output this shift:    General appearance: alert, cooperative and no distress GI: soft, minimal LLQ tenderness, ND, +BS  Lab Results:   [facility] 07/05/11 0520  WBC 7.9  HGB 11.0*  HCT 33.3*  PLT 245   BMET  Basename 07/07/11 0530 07/05/11 0520  NA 142 140  K 3.4* 3.5  CL 106 102  CO2 23 24  GLUCOSE 92 91  BUN 18 21  CREATININE 1.22 1.26  CALCIUM 8.1* 8.5   PT/INR  Basename 07/07/11 0530 07/06/11 0921  LABPROT 32.6* 46.3*  INR 3.12* 4.89*   ABG No results found for this basename: PHART:2,PCO2:2,PO2:2,HCO3:2 in the last 72 hours  Studies/Results: No results found.  Anti-infectives: Anti-infectives     Start     Dose/Rate Route Frequency Ordered Stop   07/06/11 1000   ciprofloxacin (CIPRO) tablet 500 mg        500 mg Oral 2 times daily 07/06/11 0923     07/06/11 0930   metroNIDAZOLE (FLAGYL) tablet 500 mg        500 mg Oral 3 times per day 07/06/11 0923     07/04/11 0800   ciprofloxacin (CIPRO) IVPB 200 mg  Status:  Discontinued        200 mg 100 mL/hr over 60 Minutes Intravenous Every 12 hours 07/03/11 1913 07/06/11 0923   07/03/11 2100   metroNIDAZOLE (FLAGYL) IVPB 500 mg  Status:  Discontinued        500 mg 100 mL/hr over 60 Minutes Intravenous Every 8 hours 07/03/11 1913  07/06/11 0923   07/03/11 2000   ciprofloxacin (CIPRO) IVPB 400 mg  Status:  Discontinued        400 mg 200 mL/hr over 60 Minutes Intravenous Every 12 hours 07/03/11 1935 07/03/11 1936   07/03/11 1545   ciprofloxacin (CIPRO) IVPB 400 mg        400 mg 200 mL/hr over 60 Minutes Intravenous  Once 07/03/11 1541 07/03/11 1734   07/03/11 1545   metroNIDAZOLE (FLAGYL) IVPB 500 mg  Status:  Discontinued        500 mg 100 mL/hr over 60 Minutes Intravenous  Once 07/03/11 1541 07/03/11 1941          Assessment/Plan: Diverticulitis: resolving, much better today, tolerating regular diet, going for CT this am, if ok can be d/c'd home on another week of cipro and flagyl.  Will need f/u with Dr. Janee Morn in 2-3 weeks or sooner if symptoms return.    LOS: 4 days    Laury Huizar 07/07/2011

## 2011-07-07 NOTE — Progress Notes (Signed)
I have reviewed his CT, today's and the outside films with the radiologist. He still has a confined abscess, now has some contrast in it. It appears that he likely has perforated a small bowel diverticulum, but this appears confined up against the abdominal wall. Reviewed with the IR radiologist and this is amenable to perc drain. Option would be to go ahead with surgery, but he has multiple risk factors and prior surgery, so will be better to try non-surgical drainage first.  Discussed with Dr Earl Gala. Will order the drain, give him some Vit K tonight and recheck his INR tomorrow as it is elevated. Reviewed plans with patient and wife who understand

## 2011-07-07 NOTE — Progress Notes (Signed)
Subjective: Feels pretty good. Pain is better. Eating okay.   Objective:  Vital Signs: Filed Vitals:   07/06/11 0853 07/06/11 1300 07/06/11 2130 07/07/11 0611  BP: 180/91 170/80 155/84 172/87  Pulse: 70 70 60 62  Temp:  98.4 F (36.9 C) 97.4 F (36.3 C) 97.4 F (36.3 C)  TempSrc:  Oral Oral Oral  Resp:  18 17 17   Height:      Weight:    83.6 kg (184 lb 4.9 oz)  SpO2:  97% 98% 96%     EXAM: ABD: soft, nontender.    Intake/Output Summary (Last 24 hours) at 07/07/11 0645 Last data filed at 07/07/11 0612  Gross per 24 hour  Intake    480 ml  Output    625 ml  Net   -145 ml    Lab Results:  [facility] 07/05/11 0520  NA 140  K 3.5  CL 102  CO2 24  GLUCOSE 91  BUN 21  CREATININE 1.26  CALCIUM 8.5  MG --  PHOS --   No results found for this basename: AST:2,ALT:2,ALKPHOS:2,BILITOT:2,PROT:2,ALBUMIN:2 in the last 72 hours No results found for this basename: LIPASE:2,AMYLASE:2 in the last 72 hours  Basename 07/05/11 0520  WBC 7.9  NEUTROABS 5.7  HGB 11.0*  HCT 33.3*  MCV 88.8  PLT 245   No results found for this basename: CKTOTAL:3,CKMB:3,CKMBINDEX:3,TROPONINI:3 in the last 72 hours No components found with this basename: POCBNP:3 No results found for this basename: DDIMER:2 in the last 72 hours No results found for this basename: HGBA1C:2 in the last 72 hours No results found for this basename: CHOL:2,HDL:2,LDLCALC:2,TRIG:2,CHOLHDL:2,LDLDIRECT:2 in the last 72 hours No results found for this basename: TSH,T4TOTAL,FREET3,T3FREE,THYROIDAB in the last 72 hours No results found for this basename: VITAMINB12:2,FOLATE:2,FERRITIN:2,TIBC:2,IRON:2,RETICCTPCT:2 in the last 72 hours  Studies/Results: No results found. Medications: Scheduled Meds:   . antiseptic oral rinse  15 mL Mouth Rinse BID  . aspirin EC  81 mg Oral Daily  . carvedilol  12.5 mg Oral BID WC  . ciprofloxacin  500 mg Oral BID  . cyanocobalamin  1,000 mcg Intramuscular Q30 days  . donepezil  10 mg  Oral QHS  . furosemide  40 mg Oral Daily  . isosorbide mononitrate  15 mg Oral Daily  . loratadine  10 mg Oral Daily  . metroNIDAZOLE  500 mg Oral Q8H  . ramipril  5 mg Oral BID  . sertraline  25 mg Oral Daily  . Tamsulosin HCl  0.4 mg Oral QHS  . DISCONTD: ciprofloxacin  200 mg Intravenous Q12H  . DISCONTD: metronidazole  500 mg Intravenous Q8H   Continuous Infusions:  PRN Meds:.acetaminophen, acetaminophen, azelastine, nitroGLYCERIN, ondansetron (ZOFRAN) IV, ondansetron, sodium chloride  Assessment/Plan: Principal Problem:  *Diverticulitis - much better. I note that his CT (done outside [facility]) apparently showed abscess according to Dr. Reddy's admit note. I will repeat CT today.  Active Problems:  ARF (acute renal failure) - resolved  Anemia - stable  CHF, left ventricular - some ectopy. Will resume furosemide and ramipril today.   Dementia  CKD (chronic kidney disease), stage II  Hypertension  Paroxysmal atrial fibrillation  Myocardial infarction  Stroke  Arthritis  Chronic anticoagulation - INR still 3.12 this morning.    LOS: 4 days   Trevor Webb 07/07/2011, 6:45 AM

## 2011-07-07 NOTE — Progress Notes (Signed)
Feels good and wants to go home. VS: BP 172/87  Pulse 62  Temp 97.4 F (36.3 C) (Oral)  Resp 17  Ht 6\' 2"  (1.88 m)  Wt 184 lb 4.9 oz (83.6 kg)  BMI 23.66 kg/m2  SpO2 96% Alert, NAD Abd soft and non tender CT report pending  Likely DC later today

## 2011-07-07 NOTE — Progress Notes (Signed)
Physical Therapy Treatment Patient Details Name: Trevor Webb MRN: [id number] DOB: [birth year] Today's Date: 07/07/2011 Time: 4098-1191 PT Time Calculation (min): 24 min  PT Assessment / Plan / Recommendation Comments on Treatment Session  Pt adm with diverticulosis.  Pt doing well with mobility considering  history of impairaments.    Follow Up Recommendations  Skilled nursing facility    Barriers to Discharge        Equipment Recommendations  None recommended by PT    Recommendations for Other Services    Frequency Min 3X/week   Plan Discharge plan remains appropriate    Precautions / Restrictions Precautions Precautions: Fall Required Braces or Orthoses: Other Brace/Splint Other Brace/Splint: built up shoe for RLE   Pertinent Vitals/Pain N/A    Mobility  Bed Mobility Supine to Sit: 3: Mod assist;HOB elevated Sitting - Scoot to Edge of Bed: 4: Min assist Transfers Sit to Stand: 1: +2 Total assist;With upper extremity assist;From elevated surface;From bed (Pulls up on walker to encourage forward lean.) Sit to Stand: Patient Percentage: 60% Stand to Sit: 3: Mod assist Details for Transfer Assistance: Pt with rt hip which will not flex past ~60 degrees making sit to stand difficult. Ambulation/Gait Ambulation/Gait Assistance: 4: Min assist Ambulation Distance (Feet): 200 Feet Assistive device: Rolling walker Ambulation/Gait Assistance Details: Occasional verbal cues to take another step with RLE due to shortened step length. Gait Pattern: Decreased step length - right;Right hip hike;Trunk flexed    Exercises     PT Diagnosis:    PT Problem List:   PT Treatment Interventions:     PT Goals Acute Rehab PT Goals PT Goal: Supine/Side to Sit - Progress: Progressing toward goal PT Goal: Sit to Supine/Side - Progress: Progressing toward goal PT Goal: Sit to Stand - Progress: Progressing toward goal PT Goal: Stand to Sit - Progress: Progressing toward goal PT Goal:  Ambulate - Progress: Progressing toward goal  Visit Information  Last PT Received On: 07/07/11 Assistance Needed: +2 (family can usually help)    Subjective Data  Subjective: Pt agreeable to amb.   Cognition  Overall Cognitive Status: History of cognitive impairments - at baseline Arousal/Alertness: Awake/alert Orientation Level: Appears intact for tasks assessed Behavior During Session: [facility] for tasks performed    Balance  Static Standing Balance Static Standing - Balance Support: Bilateral upper extremity supported (on walker) Static Standing - Level of Assistance: 4: Min assist  End of Session PT - End of Session Equipment Utilized During Treatment: Gait belt Activity Tolerance: Patient tolerated treatment well Patient left: in chair;with call bell/phone within reach;with family/visitor present Nurse Communication: Mobility status    Trevor Webb 07/07/2011, 2:49 PM  [employer] PT [phone]

## 2011-07-07 NOTE — Progress Notes (Signed)
Clinical Social Worker continuing to follow for disposition planning. Per RN, RN spoke with MD who stated pt is not medically stable for discharge today, but anticipate discharge tomorrow. Clinical Warden/ranger at [facility]. Clinical Social Worker to facilitate pt discharge needs when pt medically stable for discharge.  Jacklynn Lewis, MSW, LCSWA  Clinical Social Work [phone]

## 2011-07-08 ENCOUNTER — Encounter (HOSPITAL_COMMUNITY): Payer: Self-pay | Admitting: Radiology

## 2011-07-08 ENCOUNTER — Inpatient Hospital Stay (HOSPITAL_COMMUNITY): Payer: Medicare Other

## 2011-07-08 DIAGNOSIS — E876 Hypokalemia: Secondary | ICD-10-CM | POA: Diagnosis not present

## 2011-07-08 LAB — PROTIME-INR: Prothrombin Time: 20.9 seconds — ABNORMAL HIGH (ref 11.6–15.2)

## 2011-07-08 MED ORDER — FENTANYL CITRATE 0.05 MG/ML IJ SOLN
INTRAMUSCULAR | Status: AC
  Filled 2011-07-08: qty 4

## 2011-07-08 MED ORDER — MIDAZOLAM HCL 2 MG/2ML IJ SOLN
INTRAMUSCULAR | Status: AC
  Filled 2011-07-08: qty 6

## 2011-07-08 NOTE — Progress Notes (Signed)
Clinical Social Worker continuing to follow for disposition planning. Pt is from [facility] at [place] and plan is to return. Per MD, pt having perc drain to be sure the abcsess/perforation is contained today. Clinical Social Worker contacted facility to updated on pt clinical condition. Clinical Social Worker to continue to follow and facilitate pt discharge needs when pt medically stable for discharge.   Jacklynn Lewis, MSW, LCSWA  Clinical Social Work [phone]

## 2011-07-08 NOTE — ED Notes (Signed)
Per Dr. Lowella Dandy, abscess drain not needed

## 2011-07-08 NOTE — Progress Notes (Signed)
Patient ID: Trevor Webb, male   DOB: 11-26-1928, 78 y.o.   MRN: [id number] Patient brought to CT for percutaneous drain placement.  CT images of the lower abdomen demonstrate a much smaller gas filled collection adjacent to small bowel.  There is no retained oral contrast within the collection.  Reviewed these CT images with Dr. Jena Gauss, who read the CT from 07/07/11.  We believe that the air-filled collection most likely represents an inflamed diverticulum rather than a perforation and abscess.  No indication for a drain placement at this time.

## 2011-07-08 NOTE — Progress Notes (Signed)
PT Cancellation Note  Treatment cancelled today due to medical issues with patient which prohibited therapy. Pt down for drain placement.  Trevor Webb 07/08/2011, 9:48 AM  [name] PT [phone]

## 2011-07-08 NOTE — H&P (Signed)
Chief Complaint: Intraabdominal abscess Referring Physician:Streck HPI: Trevor Webb is an 78 y.o. male who was admitted with diverticulitis. He looked to be doing well but a follow up CT showed evidence of abscess from apparent perforated SB diverticulum. Request for perc drain placement by the surgery team. His Coumadin has been held.  Past Medical History:  Past Medical History  Diagnosis Date  . Hypertension   . CHF, left ventricular   . Paroxysmal atrial fibrillation   . CHF (congestive heart failure)   . Angina   . Myocardial infarction 2009  . Stroke 2011    residual:  "right sided weakness; problems walking; dementia"  . Dementia     S/P CVA 2011  . Arthritis     Past Surgical History:  Past Surgical History  Procedure Date  . Coronary artery bypass graft 2009    CABG X4  . Knee arthroscopy     multiple; bilaterally  . Total knee arthroplasty     bilateral  . Total hip arthroplasty     right  . Joint replacement     bilateral knees; right hip  . Colon surgery   . Colon resection 1980's    "had colostomy bag for awhile; went back in and put it all back together"  . Colostomy takedown 1980's    Family History:  Family History  Problem Relation Age of Onset  . Stroke Mother   . Heart attack Father     Social History:  reports that he has never smoked. He has never used smokeless tobacco. He reports that he does not drink alcohol or use illicit drugs.  Allergies:  Allergies  Allergen Reactions  . Morphine And Related     Delusions    Medications: Medications Prior to Admission  Medication Sig Dispense Refill  . aspirin EC 81 MG tablet Take 81 mg by mouth daily.      Marland Kitchen azelastine (ASTELIN) 137 MCG/SPRAY nasal spray Place 2 sprays into the nose 2 (two) times daily. Use in each nostril as directed      . carvedilol (COREG) 12.5 MG tablet Take 12.5 mg by mouth 2 (two) times daily with a meal.      . cyanocobalamin (,VITAMIN B-12,) 1000 MCG/ML  injection Inject 1,000 mcg into the muscle every 30 (thirty) days. Give on the 24th of each month      . donepezil (ARICEPT) 10 MG tablet Take 10 mg by mouth at bedtime.      . furosemide (LASIX) 40 MG tablet Take 40 mg by mouth daily.      . isosorbide dinitrate (ISORDIL) 30 MG tablet Take 15 mg by mouth daily.      Marland Kitchen loratadine (CLARITIN) 10 MG tablet Take 10 mg by mouth daily.      . ramipril (ALTACE) 5 MG capsule Take 5 mg by mouth 2 (two) times daily.      . sertraline (ZOLOFT) 25 MG tablet Take 25 mg by mouth daily.      . Tamsulosin HCl (FLOMAX) 0.4 MG CAPS Take 0.4 mg by mouth at bedtime.      Marland Kitchen warfarin (COUMADIN) 2 MG tablet Take 2 mg by mouth See admin instructions. Takes 2mg  on Monday and Friday      . warfarin (COUMADIN) 3 MG tablet Take 3 mg by mouth See admin instructions. Takes 3mg  on Tuesday, Wednesday, Thursday, Saturday, and Sunday      . nitroGLYCERIN (NITROSTAT) 0.4 MG SL tablet Place 0.4 mg under the  tongue every 5 (five) minutes as needed. For chest pain      . sodium chloride (OCEAN) 0.65 % nasal spray Place 2 sprays into the nose daily as needed. For nasal congestion        Please HPI for pertinent positives, otherwise complete 10 system ROS negative.  Physical Exam: Blood pressure 159/77, pulse 79, temperature 98.3 F (36.8 C), temperature source Oral, resp. rate 18, height 6\' 2"  (1.88 m), weight 188 lb 15 oz (85.7 kg), SpO2 97.00%. Body mass index is 24.26 kg/(m^2).   General Appearance:  Awake, cooperative, no distress, appears stated age  Head:  Normocephalic, without obvious abnormality, atraumatic  ENT: Unremarkable  Neck: Supple, symmetrical, trachea midline, no adenopathy, thyroid: not enlarged, symmetric, no tenderness/mass/nodules  Lungs:   Clear to auscultation bilaterally, no w/r/r, respirations unlabored without use of accessory muscles.  Heart:  Regular rate and rhythm, S1, S2 normal, no murmur, rub or gallop. Carotids 2+ without bruit.  Abdomen:    Soft, mildly tender left abdomen. Bowel sounds active all four quadrants,  no masses, no organomegaly.  Extremities: Extremities normal, atraumatic, no cyanosis or edema  Neurologic: Post CVA deficits noted.   Results for orders placed during the hospital encounter of 07/03/11 (from the past 48 hour(s))  BASIC METABOLIC PANEL     Status: Abnormal   Collection Time   07/07/11  5:30 AM      Component Value Range Comment   Sodium 142  135 - 145 mEq/L    Potassium 3.4 (*) 3.5 - 5.1 mEq/L    Chloride 106  96 - 112 mEq/L    CO2 23  19 - 32 mEq/L    Glucose, Bld 92  70 - 99 mg/dL    BUN 18  6 - 23 mg/dL    Creatinine, Ser 1.61  0.50 - 1.35 mg/dL    Calcium 8.1 (*) 8.4 - 10.5 mg/dL    GFR calc non Af Amer 54 (*) >90 mL/min    GFR calc Af Amer 62 (*) >90 mL/min   PROTIME-INR     Status: Abnormal   Collection Time   07/08/11  5:00 AM      Component Value Range Comment   Prothrombin Time 20.9 (*) 11.6 - 15.2 seconds    INR 1.77 (*) 0.00 - 1.49    Ct Abdomen Pelvis W Contrast  07/07/2011  **ADDENDUM** CREATED: 07/07/2011 16:05:27  The study is now compared to the prior study performed at [facility].  The small bowel abscess in the left mid abdomen is essentially unchanged in size and configuration. Today's study does demonstrate the patient has multiple small bowel diverticula in that area and this is felt to represent a ruptured small bowel diverticulum with secondary abscess.  The contrast given for today's exam does extend into the abscess cavity.  **END ADDENDUM** SIGNED BY: Allayne Gitelman. Jena Gauss, M.D.   07/07/2011  *RADIOLOGY REPORT*  Clinical Data: Diverticulitis follow-up. Abscess seen on outside CT (images not available).  CT ABDOMEN AND PELVIS WITH CONTRAST  Technique:  Multidetector CT imaging of the abdomen and pelvis was performed following the standard protocol during bolus administration of intravenous contrast.  Contrast: 80mL OMNIPAQUE IOHEXOL 300 MG/ML  SOLN  Comparison:  None.  Findings: Cardiomegaly.  Mitral annular calcification.  Bibasilar scarring versus atelectasis.  Trace pleural fluid.  Low attenuation of the liver is nonspecific post contrast however may reflect hepatic steatosis.  Lobular splenic contour without focal abnormality.  Unremarkable pancreas.  Layering gallstones and sludge.  No gallbladder wall thickening or biliary ductal dilatation.  Symmetric adrenal glands.  Lobular renal contours.  Tiny nonobstructing left renal stone versus vascular calcification. Nonspecific mild perinephric fat stranding.  Incompletely characterized hypodensity, upper pole left kidney either bilobed or two adjacent lesions. No significant appreciable change/increase in density on the delayed phase. Exophytic hypodensity off the lower pole is most in keeping with a simple cyst.  No hydronephrosis or hydroureter.  Colonic diverticulosis and extensive fat stranding the left lower quadrant. There is an extraluminal collection abutting a loop of small bowel that measures up to 3.0 x 4.0 x 4.3 cm.  Significant surrounding stranding of the mesenteric/peritoneal fat.  Small amount of free fluid within the pelvis dependently. Circumferential rectal wall thickening is nonspecific.  Circumferential bladder wall thickening is nonspecific. Prostatomegaly.  No lymphadenopathy.  There is scattered atherosclerotic calcification of the aorta and its branches. No aneurysmal dilatation.  Extensive heterotopic bone/osseous changes of the right hip status post plate and screw fixation, partially imaged.  Diffuse osteopenia and multilevel degenerative changes. Lumbarized S1. Compression fracture of T12 is age indeterminate.  IMPRESSION: Extraluminal collection of air and ingested contrast within the left lower quadrant is concerning for abscess. While in close proximity to the descending colon, the collection is inseparable from a loop of small bowel, therefore suspicious for small bowel  communication/contained perforation.  Discussed via telephone with Dr. Earl Gala at 01:30 p.m. on 07/07/2011. Original Report Authenticated By: Gwynn Burly, M.D.    Assessment/Plan Presumed small bowel diverticulitis wit perforation and abscess. For CT guided drainage today. Reviewed with attending IR MD, ok with INR at 1.7 Discussed procedure with spouse(POA) and son Trevor Webb. Consent signed in chart.  Brayton El PA-C 07/08/2011, 10:10 AM

## 2011-07-08 NOTE — Progress Notes (Signed)
  Diverticulitis  Subjective: Sleeping when I came in. Denies abdominal pain. Overall feels well.   Objective: Vital signs in last 24 hours: Temp:  [97.6 F (36.4 C)-98.3 F (36.8 C)] 98.3 F (36.8 C) (06/18 0543) Pulse Rate:  [65-79] 79  (06/18 0543) Resp:  [18-19] 18  (06/18 0543) BP: (121-159)/(64-77) 159/77 mmHg (06/18 0543) SpO2:  [95 %-97 %] 97 % (06/18 0543) Weight:  [188 lb 15 oz (85.7 kg)] 188 lb 15 oz (85.7 kg) (06/18 0543) Last BM Date: 07/06/11  Intake/Output from previous day: 06/17 0701 - 06/18 0700 In: 990 [P.O.:990] Out: 775 [Urine:775] Intake/Output this shift:    General appearance: alert, cooperative, appears stated age and no distress GI: soft, non-tender; bowel sounds normal; no masses,  no organomegaly  Lab Results:  Results for orders placed during the hospital encounter of 07/03/11 (from the past 24 hour(s))  PROTIME-INR     Status: Abnormal   Collection Time   07/08/11  5:00 AM      Component Value Range   Prothrombin Time 20.9 (*) 11.6 - 15.2 seconds   INR 1.77 (*) 0.00 - 1.49     Studies/Results Radiology     MEDS, Scheduled    . antiseptic oral rinse  15 mL Mouth Rinse BID  . aspirin EC  81 mg Oral Daily  . carvedilol  12.5 mg Oral BID WC  . ciprofloxacin  500 mg Oral BID  . cyanocobalamin  1,000 mcg Intramuscular Q30 days  . donepezil  10 mg Oral QHS  . furosemide  40 mg Oral Daily  . iohexol  20 mL Oral Q1 Hr x 2  . isosorbide mononitrate  15 mg Oral Daily  . loratadine  10 mg Oral Daily  . metroNIDAZOLE  500 mg Oral Q8H  . phytonadione (VITAMIN K) IV  5 mg Intravenous Once  . ramipril  5 mg Oral BID  . sertraline  25 mg Oral Daily  . Tamsulosin HCl  0.4 mg Oral QHS     Assessment: Diverticulitis Apparently from perforated small bowel diverticulum. Ct looks worse than clinical exam  Plan: Would like to get perc drain to be sure the abcsess/perforation is contained. May eventually need surgery Will follow  LOS: 5  days    Currie Paris, MD, [facility], [place] [phone]   07/08/2011 9:14 AM

## 2011-07-08 NOTE — Progress Notes (Signed)
Subjective: Denies pain and diarrhea. He is not sure where he is this morning. He is reminded that he is a [facility].  Objective:  Vital Signs: Filed Vitals:   07/07/11 0611 07/07/11 1411 07/07/11 2046 07/08/11 0543  BP: 172/87 121/64 143/74 159/77  Pulse: 62 65 68 79  Temp: 97.4 F (36.3 C) 97.6 F (36.4 C) 98.1 F (36.7 C) 98.3 F (36.8 C)  TempSrc: Oral Oral Oral Oral  Resp: 17 18 19 18   Height:      Weight: 83.6 kg (184 lb 4.9 oz)   85.7 kg (188 lb 15 oz)  SpO2: 96% 95% 97% 97%     EXAM: Soft, nontender, no distension   Intake/Output Summary (Last 24 hours) at 07/08/11 0650 Last data filed at 07/08/11 1610  Gross per 24 hour  Intake    990 ml  Output    775 ml  Net    215 ml    Lab Results:  Basename 07/07/11 0530  NA 142  K 3.4*  CL 106  CO2 23  GLUCOSE 92  BUN 18  CREATININE 1.22  CALCIUM 8.1*  MG --  PHOS --   No results found for this basename: AST:2,ALT:2,ALKPHOS:2,BILITOT:2,PROT:2,ALBUMIN:2 in the last 72 hours No results found for this basename: LIPASE:2,AMYLASE:2 in the last 72 hours No results found for this basename: WBC:2,NEUTROABS:2,HGB:2,HCT:2,MCV:2,PLT:2 in the last 72 hours No results found for this basename: CKTOTAL:3,CKMB:3,CKMBINDEX:3,TROPONINI:3 in the last 72 hours No components found with this basename: POCBNP:3 No results found for this basename: DDIMER:2 in the last 72 hours No results found for this basename: HGBA1C:2 in the last 72 hours No results found for this basename: CHOL:2,HDL:2,LDLCALC:2,TRIG:2,CHOLHDL:2,LDLDIRECT:2 in the last 72 hours No results found for this basename: TSH,T4TOTAL,FREET3,T3FREE,THYROIDAB in the last 72 hours No results found for this basename: VITAMINB12:2,FOLATE:2,FERRITIN:2,TIBC:2,IRON:2,RETICCTPCT:2 in the last 72 hours  Studies/Results: Ct Abdomen Pelvis W Contrast  07/07/2011  **ADDENDUM** CREATED: 07/07/2011 16:05:27  The study is now compared to the prior study performed at [facility].  The small bowel abscess in the left mid abdomen is essentially unchanged in size and configuration. Today's study does demonstrate the patient has multiple small bowel diverticula in that area and this is felt to represent a ruptured small bowel diverticulum with secondary abscess.  The contrast given for today's exam does extend into the abscess cavity.  **END ADDENDUM** SIGNED BY: Allayne Gitelman. Jena Gauss, M.D.   07/07/2011  *RADIOLOGY REPORT*  Clinical Data: Diverticulitis follow-up. Abscess seen on outside CT (images not available).  CT ABDOMEN AND PELVIS WITH CONTRAST  Technique:  Multidetector CT imaging of the abdomen and pelvis was performed following the standard protocol during bolus administration of intravenous contrast.  Contrast: 80mL OMNIPAQUE IOHEXOL 300 MG/ML  SOLN  Comparison: None.  Findings: Cardiomegaly.  Mitral annular calcification.  Bibasilar scarring versus atelectasis.  Trace pleural fluid.  Low attenuation of the liver is nonspecific post contrast however may reflect hepatic steatosis.  Lobular splenic contour without focal abnormality.  Unremarkable pancreas.  Layering gallstones and sludge.  No gallbladder wall thickening or biliary ductal dilatation.  Symmetric adrenal glands.  Lobular renal contours.  Tiny nonobstructing left renal stone versus vascular calcification. Nonspecific mild perinephric fat stranding.  Incompletely characterized hypodensity, upper pole left kidney either bilobed or two adjacent lesions. No significant appreciable change/increase in density on the delayed phase. Exophytic hypodensity off the lower pole is most in keeping with a simple cyst.  No hydronephrosis or hydroureter.  Colonic diverticulosis and  extensive fat stranding the left lower quadrant. There is an extraluminal collection abutting a loop of small bowel that measures up to 3.0 x 4.0 x 4.3 cm.  Significant surrounding stranding of the mesenteric/peritoneal fat.  Small amount of free  fluid within the pelvis dependently. Circumferential rectal wall thickening is nonspecific.  Circumferential bladder wall thickening is nonspecific. Prostatomegaly.  No lymphadenopathy.  There is scattered atherosclerotic calcification of the aorta and its branches. No aneurysmal dilatation.  Extensive heterotopic bone/osseous changes of the right hip status post plate and screw fixation, partially imaged.  Diffuse osteopenia and multilevel degenerative changes. Lumbarized S1. Compression fracture of T12 is age indeterminate.  IMPRESSION: Extraluminal collection of air and ingested contrast within the left lower quadrant is concerning for abscess. While in close proximity to the descending colon, the collection is inseparable from a loop of small bowel, therefore suspicious for small bowel communication/contained perforation.  Discussed via telephone with Dr. Earl Gala at 01:30 p.m. on 07/07/2011. Original Report Authenticated By: Gwynn Burly, M.D.   Medications: Scheduled Meds:   . antiseptic oral rinse  15 mL Mouth Rinse BID  . aspirin EC  81 mg Oral Daily  . carvedilol  12.5 mg Oral BID WC  . ciprofloxacin  500 mg Oral BID  . cyanocobalamin  1,000 mcg Intramuscular Q30 days  . donepezil  10 mg Oral QHS  . furosemide  40 mg Oral Daily  . iohexol  20 mL Oral Q1 Hr x 2  . isosorbide mononitrate  15 mg Oral Daily  . loratadine  10 mg Oral Daily  . metroNIDAZOLE  500 mg Oral Q8H  . phytonadione (VITAMIN K) IV  5 mg Intravenous Once  . ramipril  5 mg Oral BID  . sertraline  25 mg Oral Daily  . Tamsulosin HCl  0.4 mg Oral QHS   Continuous Infusions:  PRN Meds:.acetaminophen, acetaminophen, azelastine, iohexol, nitroGLYCERIN, ondansetron (ZOFRAN) IV, ondansetron, sodium chloride  Assessment/Plan: Principal Problem:  *Diverticulitis - initial plan had been for d/c to SNF today. However, due to persisting abscess, decision made to aspirate and drain if INR is acceptable. INR is pending.    Active Problems:  ARF (acute renal failure) - resolved.   Anemia  CHF, left ventricular - usual meds restarted yesterday  Dementia  CKD (chronic kidney disease), stage II  Hypertension  Paroxysmal atrial fibrillation  Myocardial infarction  Stroke  Arthritis  Chronic anticoagulation - for CHF and PAF.   Hypokalemia - recheck AM.    LOS: 5 days   Trevor Webb CHARLES 07/08/2011, 6:50 AM

## 2011-07-09 MED ORDER — ISOSORBIDE MONONITRATE 15 MG HALF TABLET: 15.0000 mg | ORAL_TABLET | Freq: Every day | ORAL | Status: DC

## 2011-07-09 MED ORDER — CIPROFLOXACIN HCL 500 MG PO TABS: 500.0000 mg | ORAL_TABLET | Freq: Two times a day (BID) | ORAL | Status: AC

## 2011-07-09 MED ORDER — METRONIDAZOLE 500 MG PO TABS: 500.0000 mg | ORAL_TABLET | Freq: Three times a day (TID) | ORAL | Status: AC

## 2011-07-09 NOTE — Progress Notes (Signed)
Physical Therapy Treatment Patient Details Name: Trevor Webb MRN: [id number] DOB: [birth year] Today's Date: 07/09/2011 Time: 5621-3086 PT Time Calculation (min): 26 min  PT Assessment / Plan / Recommendation Comments on Treatment Session  Pt adm with diverticulosis.  Pt didn't require drain placement yesterday.  Feel pt could benefit from AFO and wife to follow up with this at SNF.    Follow Up Recommendations  Skilled nursing facility    Barriers to Discharge        Equipment Recommendations  None recommended by PT    Recommendations for Other Services    Frequency Min 3X/week   Plan Discharge plan remains appropriate;Frequency remains appropriate    Precautions / Restrictions Precautions Precautions: Fall   Pertinent Vitals/Pain N/A    Mobility  Bed Mobility Supine to Sit: 4: Min assist;HOB elevated Sitting - Scoot to Edge of Bed: 4: Min assist Details for Bed Mobility Assistance: Due to old rt hip problems pt unable to achieve 90 degrees flexion of rt. hip causing posterior lean in sitting. Transfers Sit to Stand: 1: +2 Total assist;From elevated surface;With upper extremity assist;From bed (Pt pulls up on walker to encourage forward lean.) Sit to Stand: Patient Percentage: 60% Stand to Sit: 3: Mod assist;With armrests;With upper extremity assist;To chair/3-in-1 Details for Transfer Assistance: Assist to control descent and assist to bring hips up do to immobility of rt. hip. Ambulation/Gait Ambulation/Gait Assistance: 4: Min assist Ambulation Distance (Feet): 150 Feet Assistive device: Rolling walker Ambulation/Gait Assistance Details: Pt became fatigued in hallway and wife had to bring recliner for pt to sit in. Gait Pattern: Decreased step length - right;Decreased dorsiflexion - right;Trunk flexed General Gait Details: Brought AFO to room to show pt and wife.    Exercises     PT Diagnosis:    PT Problem List:   PT Treatment Interventions:     PT  Goals Acute Rehab PT Goals PT Goal: Supine/Side to Sit - Progress: Met PT Goal: Sit to Stand - Progress: Progressing toward goal PT Goal: Stand to Sit - Progress: Progressing toward goal PT Goal: Ambulate - Progress: Progressing toward goal  Visit Information  Last PT Received On: 07/09/11 Assistance Needed: +2 (family usually can help)    Subjective Data  Subjective: Wife states pt has never had foot brace when asked about AFO.   Cognition  Overall Cognitive Status: History of cognitive impairments - at baseline Arousal/Alertness: Awake/alert Orientation Level: Appears intact for tasks assessed Behavior During Session: [facility] for tasks performed    Balance  Static Standing Balance Static Standing - Balance Support: Bilateral upper extremity supported;During functional activity Static Standing - Level of Assistance: 4: Min assist  End of Session PT - End of Session Equipment Utilized During Treatment: Gait belt Activity Tolerance: Patient limited by fatigue Patient left: in chair;with call bell/phone within reach;with family/visitor present Nurse Communication: Mobility status    Trevor Webb 07/09/2011, 3:07 PM  [employer] PT [phone]

## 2011-07-09 NOTE — Progress Notes (Signed)
Elana Alm Henney to be D/C'd [facility] per MD order.  Discussed with the patient and all questions fully answered.   Jadie, [place]  Home Medication Instructions [id number]   Printed on:07/09/11 1154  Medication Information                    aspirin EC 81 MG tablet Take 81 mg by mouth daily.           nitroGLYCERIN (NITROSTAT) 0.4 MG SL tablet Place 0.4 mg under the tongue every 5 (five) minutes as needed. For chest pain           cyanocobalamin (,VITAMIN B-12,) 1000 MCG/ML injection Inject 1,000 mcg into the muscle every 30 (thirty) days. Give on the 24th of each month           loratadine (CLARITIN) 10 MG tablet Take 10 mg by mouth daily.           Tamsulosin HCl (FLOMAX) 0.4 MG CAPS Take 0.4 mg by mouth at bedtime.           carvedilol (COREG) 12.5 MG tablet Take 12.5 mg by mouth 2 (two) times daily with a meal.           ramipril (ALTACE) 5 MG capsule Take 5 mg by mouth 2 (two) times daily.           donepezil (ARICEPT) 10 MG tablet Take 10 mg by mouth at bedtime.           sodium chloride (OCEAN) 0.65 % nasal spray Place 2 sprays into the nose daily as needed. For nasal congestion           warfarin (COUMADIN) 2 MG tablet Take 2 mg by mouth See admin instructions. Takes 2mg  on Monday and Friday           sertraline (ZOLOFT) 25 MG tablet Take 25 mg by mouth daily.           furosemide (LASIX) 40 MG tablet Take 40 mg by mouth daily.           azelastine (ASTELIN) 137 MCG/SPRAY nasal spray Place 2 sprays into the nose 2 (two) times daily. Use in each nostril as directed           warfarin (COUMADIN) 3 MG tablet Take 3 mg by mouth See admin instructions. Takes 3mg  on Tuesday, Wednesday, Thursday, Saturday, and Sunday           ciprofloxacin (CIPRO) 500 MG tablet Take 1 tablet (500 mg total) by mouth 2 (two) times daily.           isosorbide mononitrate (IMDUR) 15 mg TB24 Take 0.5 tablets (15 mg total) by mouth daily.           metroNIDAZOLE (FLAGYL)  500 MG tablet Take 1 tablet (500 mg total) by mouth every 8 (eight) hours.             VVS, Skin clean, dry and intact without evidence of skin break down, no evidence of skin tears noted. IV catheter discontinued intact. Site without signs and symptoms of complications. Dressing and pressure applied.  An After Visit Summary was printed and given to the patient. Patient escorted via WC, and D/C to [facility] via private auto.  Kennyth Arnold D 07/09/2011 11:54 AM

## 2011-07-09 NOTE — Discharge Summary (Signed)
Physician Discharge Summary  NAME:Trevor Webb  AVW:[id number]  DOB: 03/17/1928   Admit date: 07/03/2011 Discharge date: 07/09/2011  Admitting Diagnosis: Diverticulitis with abscess  Discharge Diagnoses:  Principal Problem:  *Diverticulitis Active Problems:  Anemia  CHF, left ventricular  Dementia  CKD (chronic kidney disease), stage II  Hypertension  Paroxysmal atrial fibrillation  Myocardial infarction  Stroke  Arthritis  Chronic anticoagulation  Hypokalemia   Discharge Condition: improved   Hospital Course: patient admitted and started on IV Cipro and Flagyl. He improved quickly. Followup CT suggested that he had an abscess cavity with contrast associated with small bowel. Plans made to drain. On CT at time of planned drainage, cavity air filled and much smaller. Area was not drained. Patient had steady improvement, good appetite, and he is discharged back to SNF in good condition.   Please note that warfarin was held during this hospitalization as it was thought he might need surgery or other intervention. It is being resumed at discharge.   Consults: General surgery  Disposition: 03-Skilled Nursing Facility   Medication List  As of 07/09/2011  7:25 AM   STOP taking these medications         isosorbide dinitrate 30 MG tablet         TAKE these medications         aspirin EC 81 MG tablet   Take 81 mg by mouth daily.      azelastine 137 MCG/SPRAY nasal spray   Commonly known as: ASTELIN   Place 2 sprays into the nose 2 (two) times daily. Use in each nostril as directed      carvedilol 12.5 MG tablet   Commonly known as: COREG   Take 12.5 mg by mouth 2 (two) times daily with a meal.      ciprofloxacin 500 MG tablet   Commonly known as: CIPRO   Take 1 tablet (500 mg total) by mouth 2 (two) times daily.      cyanocobalamin 1000 MCG/ML injection   Commonly known as: (VITAMIN B-12)   Inject 1,000 mcg into the muscle every 30 (thirty) days. Give on the 24th  of each month      donepezil 10 MG tablet   Commonly known as: ARICEPT   Take 10 mg by mouth at bedtime.      furosemide 40 MG tablet   Commonly known as: LASIX   Take 40 mg by mouth daily.      isosorbide mononitrate 15 mg Tb24   Commonly known as: IMDUR   Take 0.5 tablets (15 mg total) by mouth daily.      loratadine 10 MG tablet   Commonly known as: CLARITIN   Take 10 mg by mouth daily.      metroNIDAZOLE 500 MG tablet   Commonly known as: FLAGYL   Take 1 tablet (500 mg total) by mouth every 8 (eight) hours.      nitroGLYCERIN 0.4 MG SL tablet   Commonly known as: NITROSTAT   Place 0.4 mg under the tongue every 5 (five) minutes as needed. For chest pain      ramipril 5 MG capsule   Commonly known as: ALTACE   Take 5 mg by mouth 2 (two) times daily.      sertraline 25 MG tablet   Commonly known as: ZOLOFT   Take 25 mg by mouth daily.      sodium chloride 0.65 % nasal spray   Commonly known as: OCEAN   Place 2 sprays  into the nose daily as needed. For nasal congestion      Tamsulosin HCl 0.4 MG Caps   Commonly known as: FLOMAX   Take 0.4 mg by mouth at bedtime.      warfarin 2 MG tablet   Commonly known as: COUMADIN   Take 2 mg by mouth See admin instructions. Takes 2mg  on Monday and Friday      warfarin 3 MG tablet   Commonly known as: COUMADIN   Take 3 mg by mouth See admin instructions. Takes 3mg  on Tuesday, Wednesday, Thursday, Saturday, and Sunday           Follow-up Information    Follow up with [facility] E, MD. Call in 2 weeks. (f/u diverticulitis)    Contact information:   [facility] [address] [place] [address] [phone]          Things to follow up in the outpatient setting: Coumadin held during admit and is now being resumed. Needs INR in the next two days as he is also on antibiotics. He will need this adjusted appropriately.   Time coordinating discharge: 25 minutes including  medication reconciliation,  preparation of discharge papers, and discussion with patient  Signed: Darnelle Bos 07/09/2011, 7:25 AM

## 2011-07-09 NOTE — Progress Notes (Signed)
Patient is improved. Events of yesterday noted. I think he can go back to [facility] at this point assuming surgery agrees. Would appreciate call from surgery or RN after their evaluation so I can write other d/c orders. [phone]  I have done Med Rec. FL2 is signed. Should not need PASRR.

## 2011-07-09 NOTE — Progress Notes (Addendum)
Clinical Social Worker continuing to follow for disposition planning. Pt is from [place] at [facility] and plan is to return when pt medically stable for discharge. Per chart, appears plan is to monitor pt today and pt to return to SNF tomorrow. Clinical Social Worker contacted [employer] at [place] to notify. Clinical Social Worker to facilitate pt discharge needs when pt medically stable for discharge.  Addendum 11:25am: Clinical Child psychotherapist received notification from RN that RN spoke with surgery and pt is medically stable for discharge today. RN notifying attending physician. Clinical Warden/ranger at [place]. Clinical Social Worker to facilitate pt discharge needs.  Jacklynn Lewis, MSW, LCSWA  Clinical Social Work [phone]

## 2011-07-09 NOTE — Progress Notes (Signed)
Spoke with Doristine Mango, PA-C who stated that pt could be discharged today on PO antibiotics. Notified Dr. Particia Lather who stated he would put in orders before 2pm. Suzanna with social work notified.

## 2011-07-09 NOTE — Progress Notes (Signed)
Agree with A&P of EW<PA.Marland Kitchen Abdomen is benign and he can go on oral antibiotic to f/u with Dr Janee Morn in a week or 10 days

## 2011-07-09 NOTE — Progress Notes (Signed)
Patient ID: [name], male   DOB: [birth year], 78 y.o.   MRN: [id number] Patient ID: ARDEAN MELROY, male   DOB: 1928/09/04, 78 y.o.   MRN: [id number]    Subjective No complaints, tolerating diet well, denies abd pain/fever/chills  Objective: Vital signs in last 24 hours: Temp:  [97.6 F (36.4 C)-98.2 F (36.8 C)] 97.7 F (36.5 C) (06/19 0735) Pulse Rate:  [41-79] 67  (06/19 0735) Resp:  [16-20] 18  (06/19 0735) BP: (119-146)/(67-89) 146/84 mmHg (06/19 0735) SpO2:  [97 %-99 %] 98 % (06/19 0735) Weight:  [178 lb 12.7 oz (81.1 kg)-187 lb 13.3 oz (85.2 kg)] 178 lb 12.7 oz (81.1 kg) (06/19 0704) Last BM Date: 07/08/11  Intake/Output from previous day: 06/18 0701 - 06/19 0700 In: 240 [P.O.:240] Out: 700 [Urine:700] Intake/Output this shift: Total I/O In: -  Out: 200 [Urine:200]  General appearance: alert, cooperative and no distress GI: soft, nontender, ND, +BS  Lab Results:  No results found for this basename: WBC:2,HGB:2,HCT:2,PLT:2 in the last 72 hours BMET  [facility] 07/07/11 0530  NA 142  K 3.4*  CL 106  CO2 23  GLUCOSE 92  BUN 18  CREATININE 1.22  CALCIUM 8.1*   PT/INR  Basename 07/08/11 0500 07/07/11 0530  LABPROT 20.9* 32.6*  INR 1.77* 3.12*   ABG No results found for this basename: PHART:2,PCO2:2,PO2:2,HCO3:2 in the last 72 hours  Studies/Results: Ct Abdomen Pelvis W Contrast  07/07/2011  **ADDENDUM** CREATED: 07/07/2011 16:05:27  The study is now compared to the prior study performed at [facility]  The small bowel abscess in the left mid abdomen is essentially unchanged in size and configuration. Today's study does demonstrate the patient has multiple small bowel diverticula in that area and this is felt to represent a ruptured small bowel diverticulum with secondary abscess.  The contrast given for today's exam does extend into the abscess cavity.  **END ADDENDUM** SIGNED BY: Allayne Gitelman. Jena Gauss, M.D.   07/07/2011  *RADIOLOGY  REPORT*  Clinical Data: Diverticulitis follow-up. Abscess seen on outside CT (images not available).  CT ABDOMEN AND PELVIS WITH CONTRAST  Technique:  Multidetector CT imaging of the abdomen and pelvis was performed following the standard protocol during bolus administration of intravenous contrast.  Contrast: 80mL OMNIPAQUE IOHEXOL 300 MG/ML  SOLN  Comparison: None.  Findings: Cardiomegaly.  Mitral annular calcification.  Bibasilar scarring versus atelectasis.  Trace pleural fluid.  Low attenuation of the liver is nonspecific post contrast however may reflect hepatic steatosis.  Lobular splenic contour without focal abnormality.  Unremarkable pancreas.  Layering gallstones and sludge.  No gallbladder wall thickening or biliary ductal dilatation.  Symmetric adrenal glands.  Lobular renal contours.  Tiny nonobstructing left renal stone versus vascular calcification. Nonspecific mild perinephric fat stranding.  Incompletely characterized hypodensity, upper pole left kidney either bilobed or two adjacent lesions. No significant appreciable change/increase in density on the delayed phase. Exophytic hypodensity off the lower pole is most in keeping with a simple cyst.  No hydronephrosis or hydroureter.  Colonic diverticulosis and extensive fat stranding the left lower quadrant. There is an extraluminal collection abutting a loop of small bowel that measures up to 3.0 x 4.0 x 4.3 cm.  Significant surrounding stranding of the mesenteric/peritoneal fat.  Small amount of free fluid within the pelvis dependently. Circumferential rectal wall thickening is nonspecific.  Circumferential bladder wall thickening is nonspecific. Prostatomegaly.  No lymphadenopathy.  There is scattered atherosclerotic calcification of the aorta and its branches. No aneurysmal dilatation.  Extensive heterotopic bone/osseous changes of the right hip status post plate and screw fixation, partially imaged.  Diffuse osteopenia and multilevel degenerative  changes. Lumbarized S1. Compression fracture of T12 is age indeterminate.  IMPRESSION: Extraluminal collection of air and ingested contrast within the left lower quadrant is concerning for abscess. While in close proximity to the descending colon, the collection is inseparable from a loop of small bowel, therefore suspicious for small bowel communication/contained perforation.  Discussed via telephone with Dr. Earl Gala at 01:30 p.m. on 07/07/2011. Original Report Authenticated By: Gwynn Burly, M.D.   Ct Abd Limited W/o Cm  07/08/2011  *RADIOLOGY REPORT*  Clinical Data: 78 year old with diverticulitis.  The patient being evaluated for a CT guided drain placement.  CT ABDOMEN LIMITED WITHOUT CONTRAST  Technique: Noncontrast images through the lower abdomen were obtained.  Comparison:  CT dated 07/07/2011  Findings: Images through the lower abdomen and the pelvis were obtained. Again noted is the inflammatory process in the anterior left lower quadrant.  There appears to be decreased inflammation compared to the prior examination.  The air-fluid collection in the left lower quadrant now measures 3.2 x 1.9 cm and previously measured 3.0 x 4.0 cm.  There is no longer oral contrast within the air-fluid collection.  There is now oral contrast within the colon.  IMPRESSION: The air-fluid collection in the anterior left lower quadrant has decreased in size.  There is no longer oral contrast within this collection.  The findings are more suggestive for an inflamed small bowel diverticulum rather than a perforation and abscess.  As a result, a drainage catheter was not placed.  Original Report Authenticated By: Richarda Overlie, M.D.    Anti-infectives: Anti-infectives     Start     Dose/Rate Route Frequency Ordered Stop   07/09/11 0000   ciprofloxacin (CIPRO) 500 MG tablet        500 mg Oral 2 times daily 07/09/11 0724 07/16/11 2359   07/09/11 0000   metroNIDAZOLE (FLAGYL) 500 MG tablet        500 mg Oral Every 8  hours 07/09/11 0724 07/16/11 2359   07/06/11 1000   ciprofloxacin (CIPRO) tablet 500 mg        500 mg Oral 2 times daily 07/06/11 0923     07/06/11 0930   metroNIDAZOLE (FLAGYL) tablet 500 mg        500 mg Oral 3 times per day 07/06/11 0923     07/04/11 0800   ciprofloxacin (CIPRO) IVPB 200 mg  Status:  Discontinued        200 mg 100 mL/hr over 60 Minutes Intravenous Every 12 hours 07/03/11 1913 07/06/11 0923   07/03/11 2100   metroNIDAZOLE (FLAGYL) IVPB 500 mg  Status:  Discontinued        500 mg 100 mL/hr over 60 Minutes Intravenous Every 8 hours 07/03/11 1913 07/06/11 0923   07/03/11 2000   ciprofloxacin (CIPRO) IVPB 400 mg  Status:  Discontinued        400 mg 200 mL/hr over 60 Minutes Intravenous Every 12 hours 07/03/11 1935 07/03/11 1936   07/03/11 1545   ciprofloxacin (CIPRO) IVPB 400 mg        400 mg 200 mL/hr over 60 Minutes Intravenous  Once 07/03/11 1541 07/03/11 1734   07/03/11 1545   metroNIDAZOLE (FLAGYL) IVPB 500 mg  Status:  Discontinued        500 mg 100 mL/hr over 60 Minutes Intravenous  Once 07/03/11 1541 07/03/11 1941  Assessment/Plan: Diverticulitis: resolving, much better today, tolerating regular diet, CT yesterday showed marked improvement therefore no drain was placed, clinically resolved from symptoms, likely can go back to facility tomorrow if no problems today.       LOS: 6 days    Willim Turnage 07/09/2011

## 2011-07-09 NOTE — Progress Notes (Signed)
Clinical Social Worker facilitated pt discharge needs including contacting facility, sending pt discharge information to [facility] [place], and speaking with pt and pt wife at bedside. Pt wife reports that pt and pt wife are currently awaiting return phone call from pt son in regard to pt son providing transportation for pt back to [facility] [place]. Pt plan is for pt son to transport pt back to [facility] at [place] via private vehicle. Clinical Social Worker notified facility that pt may be late afternoon re-admit and facility agreeable to pt return at any time as facility has received discharge paperwork. Clinical Social Worker provided pt discharge packet in [facility] and RN notified to provide to pt at discharge. No further social work needs identified at this time. Clinical Social Worker signing off.  Jacklynn Lewis, MSW, LCSWA  Clinical Social Work [phone]

## 2011-07-16 ENCOUNTER — Encounter (INDEPENDENT_AMBULATORY_CARE_PROVIDER_SITE_OTHER): Payer: Self-pay | Admitting: General Surgery

## 2011-07-16 ENCOUNTER — Ambulatory Visit (INDEPENDENT_AMBULATORY_CARE_PROVIDER_SITE_OTHER): Payer: Medicare Other | Admitting: General Surgery

## 2011-07-16 ENCOUNTER — Encounter (INDEPENDENT_AMBULATORY_CARE_PROVIDER_SITE_OTHER): Payer: Medicare Other | Admitting: General Surgery

## 2011-07-16 VITALS — BP 102/54 | HR 64 | Temp 97.2°F | Resp 18 | Ht 74.0 in

## 2011-07-16 DIAGNOSIS — K5710 Diverticulosis of small intestine without perforation or abscess without bleeding: Secondary | ICD-10-CM | POA: Insufficient documentation

## 2011-07-16 NOTE — Progress Notes (Signed)
Subjective:     Patient ID: Trevor Webb, male   DOB: 10-Oct-1928, 78 y.o.   MRN: [id number]  HPI Patient was hospitalized for perforated small bowel diverticulum. He was straight with IV antibiotics. He did well clinically. Followup scan clarified this as the diagnosis and demonstrated no residual perforation or abscess. He is doing rehabilitation at skilled nursing at [facility].  Review of Systems     Objective:   Physical Exam  Constitutional: No distress.  HENT:  Head: Normocephalic.  Pulmonary/Chest: Effort normal and breath sounds normal. No respiratory distress. He has no wheezes.  Musculoskeletal:       In wheelchair   Abdomen is soft and nontender. No masses are felt. Bowel sounds are active.    Assessment:     Improved    Plan:     No surgical intervention needed at this time. return p.r.n.

## 2011-08-13 ENCOUNTER — Encounter (INDEPENDENT_AMBULATORY_CARE_PROVIDER_SITE_OTHER): Payer: Medicare Other | Admitting: General Surgery

## 2012-03-25 ENCOUNTER — Encounter (HOSPITAL_COMMUNITY): Payer: Self-pay | Admitting: *Deleted

## 2012-03-25 ENCOUNTER — Inpatient Hospital Stay (HOSPITAL_COMMUNITY)
Admission: EM | Admit: 2012-03-25 | Discharge: 2012-03-30 | DRG: 378 | Disposition: A | Discharge status: Skilled Nursing Facility | Payer: Medicare PPO | Attending: Internal Medicine | Admitting: Internal Medicine

## 2012-03-25 DIAGNOSIS — Z96649 Presence of unspecified artificial hip joint: Secondary | ICD-10-CM

## 2012-03-25 DIAGNOSIS — I252 Old myocardial infarction: Secondary | ICD-10-CM

## 2012-03-25 DIAGNOSIS — G40909 Epilepsy, unspecified, not intractable, without status epilepticus: Secondary | ICD-10-CM | POA: Diagnosis present

## 2012-03-25 DIAGNOSIS — Z885 Allergy status to narcotic agent status: Secondary | ICD-10-CM

## 2012-03-25 DIAGNOSIS — K625 Hemorrhage of anus and rectum: Secondary | ICD-10-CM

## 2012-03-25 DIAGNOSIS — Z96659 Presence of unspecified artificial knee joint: Secondary | ICD-10-CM

## 2012-03-25 DIAGNOSIS — I10 Essential (primary) hypertension: Secondary | ICD-10-CM

## 2012-03-25 DIAGNOSIS — Z8673 Personal history of transient ischemic attack (TIA), and cerebral infarction without residual deficits: Secondary | ICD-10-CM

## 2012-03-25 DIAGNOSIS — K219 Gastro-esophageal reflux disease without esophagitis: Secondary | ICD-10-CM | POA: Diagnosis present

## 2012-03-25 DIAGNOSIS — F0280 Dementia in other diseases classified elsewhere without behavioral disturbance: Secondary | ICD-10-CM | POA: Diagnosis present

## 2012-03-25 DIAGNOSIS — N182 Chronic kidney disease, stage 2 (mild): Secondary | ICD-10-CM

## 2012-03-25 DIAGNOSIS — Z951 Presence of aortocoronary bypass graft: Secondary | ICD-10-CM

## 2012-03-25 DIAGNOSIS — M129 Arthropathy, unspecified: Secondary | ICD-10-CM | POA: Diagnosis present

## 2012-03-25 DIAGNOSIS — Z79899 Other long term (current) drug therapy: Secondary | ICD-10-CM

## 2012-03-25 DIAGNOSIS — E876 Hypokalemia: Secondary | ICD-10-CM

## 2012-03-25 DIAGNOSIS — K5792 Diverticulitis of intestine, part unspecified, without perforation or abscess without bleeding: Secondary | ICD-10-CM

## 2012-03-25 DIAGNOSIS — K922 Gastrointestinal hemorrhage, unspecified: Principal | ICD-10-CM | POA: Diagnosis present

## 2012-03-25 DIAGNOSIS — I480 Paroxysmal atrial fibrillation: Secondary | ICD-10-CM

## 2012-03-25 DIAGNOSIS — Z7982 Long term (current) use of aspirin: Secondary | ICD-10-CM

## 2012-03-25 DIAGNOSIS — I639 Cerebral infarction, unspecified: Secondary | ICD-10-CM

## 2012-03-25 DIAGNOSIS — M1990 Unspecified osteoarthritis, unspecified site: Secondary | ICD-10-CM

## 2012-03-25 DIAGNOSIS — I509 Heart failure, unspecified: Secondary | ICD-10-CM

## 2012-03-25 DIAGNOSIS — E870 Hyperosmolality and hypernatremia: Secondary | ICD-10-CM | POA: Diagnosis present

## 2012-03-25 DIAGNOSIS — I219 Acute myocardial infarction, unspecified: Secondary | ICD-10-CM

## 2012-03-25 DIAGNOSIS — G309 Alzheimer's disease, unspecified: Secondary | ICD-10-CM | POA: Diagnosis present

## 2012-03-25 DIAGNOSIS — Z7901 Long term (current) use of anticoagulants: Secondary | ICD-10-CM

## 2012-03-25 DIAGNOSIS — K5710 Diverticulosis of small intestine without perforation or abscess without bleeding: Secondary | ICD-10-CM

## 2012-03-25 DIAGNOSIS — Z66 Do not resuscitate: Secondary | ICD-10-CM | POA: Diagnosis present

## 2012-03-25 DIAGNOSIS — I4891 Unspecified atrial fibrillation: Secondary | ICD-10-CM | POA: Diagnosis present

## 2012-03-25 DIAGNOSIS — F0390 Unspecified dementia without behavioral disturbance: Secondary | ICD-10-CM

## 2012-03-25 DIAGNOSIS — D649 Anemia, unspecified: Secondary | ICD-10-CM

## 2012-03-25 DIAGNOSIS — I129 Hypertensive chronic kidney disease with stage 1 through stage 4 chronic kidney disease, or unspecified chronic kidney disease: Secondary | ICD-10-CM | POA: Diagnosis present

## 2012-03-25 HISTORY — DX: Gastro-esophageal reflux disease without esophagitis: K21.9

## 2012-03-25 HISTORY — DX: Dementia in other diseases classified elsewhere, unspecified severity, without behavioral disturbance, psychotic disturbance, mood disturbance, and anxiety: F02.80

## 2012-03-25 HISTORY — DX: Unspecified atrial fibrillation: I48.91

## 2012-03-25 HISTORY — DX: Dysphasia: R47.02

## 2012-03-25 HISTORY — DX: Unspecified convulsions: R56.9

## 2012-03-25 HISTORY — DX: Reserved for concepts with insufficient information to code with codable children: IMO0002

## 2012-03-25 HISTORY — DX: Alzheimer's disease, unspecified: G30.9

## 2012-03-25 HISTORY — DX: Diverticulitis of intestine, part unspecified, without perforation or abscess without bleeding: K57.92

## 2012-03-25 LAB — CBC WITH DIFFERENTIAL/PLATELET
HCT: 30.2 % — ABNORMAL LOW (ref 39.0–52.0)
Hemoglobin: 10.4 g/dL — ABNORMAL LOW (ref 13.0–17.0)
Lymphs Abs: 1.7 10*3/uL (ref 0.7–4.0)
MCH: 30.1 pg (ref 26.0–34.0)
Monocytes Absolute: 0.9 10*3/uL (ref 0.1–1.0)
Monocytes Relative: 9 % (ref 3–12)
Neutro Abs: 7.3 10*3/uL (ref 1.7–7.7)
Neutrophils Relative %: 73 % (ref 43–77)
RBC: 3.46 MIL/uL — ABNORMAL LOW (ref 4.22–5.81)

## 2012-03-25 LAB — COMPREHENSIVE METABOLIC PANEL
Alkaline Phosphatase: 113 U/L (ref 39–117)
BUN: 21 mg/dL (ref 6–23)
Chloride: 108 mEq/L (ref 96–112)
Creatinine, Ser: 1.38 mg/dL — ABNORMAL HIGH (ref 0.50–1.35)
GFR calc Af Amer: 53 mL/min — ABNORMAL LOW (ref >90)
GFR calc non Af Amer: 46 mL/min — ABNORMAL LOW (ref >90)
Glucose, Bld: 96 mg/dL (ref 70–99)
Potassium: 3.6 mEq/L (ref 3.5–5.1)
Total Bilirubin: 0.4 mg/dL (ref 0.3–1.2)

## 2012-03-25 LAB — PROTIME-INR: Prothrombin Time: 22.6 seconds — ABNORMAL HIGH (ref 11.6–15.2)

## 2012-03-25 MED ORDER — NITROGLYCERIN 0.4 MG SL SUBL: 0.4000 mg | SUBLINGUAL_TABLET | SUBLINGUAL | Status: DC | PRN

## 2012-03-25 MED ORDER — VITAMIN K1 10 MG/ML IJ SOLN
5.0000 mg | Freq: Once | INTRAVENOUS | Status: AC
  Administered 2012-03-25: 5 mg via INTRAVENOUS
  Filled 2012-03-25: qty 0.5

## 2012-03-25 MED ORDER — CARVEDILOL 6.25 MG PO TABS
6.2500 mg | ORAL_TABLET | Freq: Two times a day (BID) | ORAL | Status: DC
  Administered 2012-03-26 – 2012-03-29 (×5): 6.25 mg via ORAL
  Filled 2012-03-25 (×9): qty 1

## 2012-03-25 MED ORDER — AZELASTINE HCL 0.1 % NA SOLN
2.0000 | Freq: Two times a day (BID) | NASAL | Status: DC
  Administered 2012-03-25 – 2012-03-30 (×10): 2 via NASAL
  Filled 2012-03-25: qty 30

## 2012-03-25 MED ORDER — ONDANSETRON HCL 4 MG PO TABS: 4.0000 mg | ORAL_TABLET | Freq: Four times a day (QID) | ORAL | Status: DC | PRN

## 2012-03-25 MED ORDER — PANTOPRAZOLE SODIUM 40 MG IV SOLR
40.0000 mg | INTRAVENOUS | Status: DC
  Administered 2012-03-25 – 2012-03-28 (×4): 40 mg via INTRAVENOUS
  Filled 2012-03-25 (×6): qty 40

## 2012-03-25 MED ORDER — ACETAMINOPHEN 650 MG RE SUPP: 650.0000 mg | Freq: Four times a day (QID) | RECTAL | Status: DC | PRN

## 2012-03-25 MED ORDER — ONDANSETRON HCL 4 MG/2ML IJ SOLN: 4.0000 mg | Freq: Four times a day (QID) | INTRAMUSCULAR | Status: DC | PRN

## 2012-03-25 MED ORDER — ACETAMINOPHEN 325 MG PO TABS: 650.0000 mg | ORAL_TABLET | Freq: Four times a day (QID) | ORAL | Status: DC | PRN

## 2012-03-25 MED ORDER — DONEPEZIL HCL 10 MG PO TABS
10.0000 mg | ORAL_TABLET | Freq: Every day | ORAL | Status: DC
  Administered 2012-03-25 – 2012-03-29 (×5): 10 mg via ORAL
  Filled 2012-03-25 (×6): qty 1

## 2012-03-25 MED ORDER — LEVETIRACETAM 500 MG PO TABS
500.0000 mg | ORAL_TABLET | Freq: Two times a day (BID) | ORAL | Status: DC
  Administered 2012-03-25 – 2012-03-30 (×10): 500 mg via ORAL
  Filled 2012-03-25 (×11): qty 1

## 2012-03-25 MED ORDER — LORATADINE 10 MG PO TABS
10.0000 mg | ORAL_TABLET | Freq: Every day | ORAL | Status: DC
Start: 2012-03-26 — End: 2012-03-30
  Administered 2012-03-26 – 2012-03-30 (×5): 10 mg via ORAL
  Filled 2012-03-25 (×5): qty 1

## 2012-03-25 MED ORDER — SERTRALINE HCL 25 MG PO TABS
25.0000 mg | ORAL_TABLET | Freq: Every day | ORAL | Status: DC
  Administered 2012-03-26 – 2012-03-30 (×5): 25 mg via ORAL
  Filled 2012-03-25 (×5): qty 1

## 2012-03-25 MED ORDER — TAMSULOSIN HCL 0.4 MG PO CAPS
0.4000 mg | ORAL_CAPSULE | Freq: Every day | ORAL | Status: DC
  Administered 2012-03-25 – 2012-03-29 (×5): 0.4 mg via ORAL
  Filled 2012-03-25 (×6): qty 1

## 2012-03-25 MED ORDER — SODIUM CHLORIDE 0.9 % IJ SOLN
3.0000 mL | Freq: Two times a day (BID) | INTRAMUSCULAR | Status: DC
  Administered 2012-03-25 – 2012-03-29 (×8): 3 mL via INTRAVENOUS

## 2012-03-25 NOTE — ED Notes (Signed)
PT's son carter is stepping out for a minute contact at [phone]

## 2012-03-25 NOTE — H&P (Addendum)
[facility] History and Physical  Trevor Webb:[id number] DOB: May 17, 1928 DOA: 03/25/2012  Referring physician:  PCP: Dr Earl Gala   Chief Complaint: rectal bleed.   HPI: Trevor Webb is a 79 y.o. male with prior h/o diverticulitis, diverticulosis, atrial fibrillation on coumadin, CHF, hypertension, was brought in from SNF for 2 episodes of bright red blood per rectum. His H&H  10.4/ 30.2 is slightly low from baseline of 12. He is hemodynamically stable, but very confused to give any kind of history. Most of the history was obtained from the son and the ED notes. He is being admitted for evaluation of GI bleed.   Review of Systems: could not be obtained.   Past Medical History  Diagnosis Date  . Hypertension   . CHF, left ventricular   . Paroxysmal atrial fibrillation   . CHF (congestive heart failure)   . Angina   . Myocardial infarction 2009  . Stroke 2011    residual:  "right sided weakness; problems walking; dementia"  . Dementia     S/P CVA 2011  . Arthritis   . Dysphasia   . A-fib   . GERD (gastroesophageal reflux disease)   . Alzheimer's dementia   . Seizures     epilepsy  . Diverticulitis   . Abdominal abscess    Past Surgical History  Procedure Laterality Date  . Coronary artery bypass graft  2009    CABG X4  . Knee arthroscopy      multiple; bilaterally  . Total knee arthroplasty      bilateral  . Total hip arthroplasty      right  . Joint replacement      bilateral knees; right hip  . Colon surgery    . Colon resection  1980's    "had colostomy bag for awhile; went back in and put it all back together"  . Colostomy takedown  1980's   Social History:  reports that he has never smoked. He has never used smokeless tobacco. He reports that he does not drink alcohol or use illicit drugs. where does patient live-- SNF   Allergies  Allergen Reactions  . Morphine And Related     Delusions    Family History  Problem Relation Age of  Onset  . Stroke Mother   . Heart attack Father     Prior to Admission medications   Medication Sig Start Date End Date Taking? Authorizing Jais Demir  aspirin EC 81 MG tablet Take 81 mg by mouth daily.   Yes Historical Jye Fariss, MD  azelastine (ASTELIN) 137 MCG/SPRAY nasal spray Place 2 sprays into the nose 2 (two) times daily. Use in each nostril as directed   Yes Historical Kayvion Arneson, MD  carvedilol (COREG) 6.25 MG tablet Take 6.25 mg by mouth 2 (two) times daily with a meal.   Yes Historical Jasa Dundon, MD  cyanocobalamin (,VITAMIN B-12,) 1000 MCG/ML injection Inject 1,000 mcg into the muscle every 30 (thirty) days. Give on the 24th of each month   Yes Historical Cordaryl Decelles, MD  donepezil (ARICEPT) 10 MG tablet Take 10 mg by mouth at bedtime.   Yes Historical Paullette Mckain, MD  furosemide (LASIX) 40 MG tablet Take 40 mg by mouth daily.   Yes Historical Mikylah Ackroyd, MD  isosorbide mononitrate (IMDUR) 30 MG 24 hr tablet Take 15 mg by mouth daily.   Yes Historical Leith Hedlund, MD  levETIRAcetam (KEPPRA) 500 MG tablet Take 500 mg by mouth 2 (two) times daily.   Yes Historical Sylvia Kondracki, MD  loratadine (CLARITIN) 10 MG tablet Take 10 mg by mouth daily.   Yes Historical Serai Tukes, MD  nitroGLYCERIN (NITROSTAT) 0.4 MG SL tablet Place 0.4 mg under the tongue every 5 (five) minutes as needed. For chest pain   Yes Historical Bertha Lokken, MD  ramipril (ALTACE) 5 MG capsule Take 5 mg by mouth 2 (two) times daily.   Yes Historical Mavrick Mcquigg, MD  sertraline (ZOLOFT) 25 MG tablet Take 25 mg by mouth daily.   Yes Historical Zaylen Susman, MD  sodium chloride (OCEAN) 0.65 % nasal spray Place 2 sprays into the nose daily as needed. For nasal congestion   Yes Historical Jerrod Damiano, MD  Tamsulosin HCl (FLOMAX) 0.4 MG CAPS Take 0.4 mg by mouth at bedtime.   Yes Historical Nikhil Osei, MD  warfarin (COUMADIN) 3 MG tablet Take 3 mg by mouth daily.    Yes Historical Maecy Podgurski, MD   Physical Exam: Filed Vitals:   03/25/12 1231 03/25/12 1300  03/25/12 1529  BP: 122/56 124/73 117/66  Pulse:  38 74  Temp: 98.3 F (36.8 C)    TempSrc: Oral    Resp: 16 23 18   SpO2: 99% 100% 99%    Constitutional: Vital signs reviewed.  Patient is a well-developed and well-nourished in no acute distress and cooperative with exam. Alert and oriented x3.  Head: Normocephalic and atraumatic Mouth: no erythema or exudates, MMM Eyes: PERRL, EOMI, conjunctivae normal, No scleral icterus.  Neck: Supple, Trachea midline normal ROM, No JVD, mass, thyromegaly, or carotid bruit present.  Cardiovascular: RRR, S1 normal, S2 normal, no MRG, pulses symmetric and intact bilaterally Pulmonary/Chest: CTAB, no wheezes, rales, or rhonchi Abdominal: Soft. Mildly tender  bowel sounds are normal, no masses, organomegaly, or guarding present.  Musculoskeletal: No joint deformities, erythema, or stiffness, ROM full and no nontender Hematology: no cervical, inginal, or axillary adenopathy.  Neurological: Alert but confused strength is normal and symmetric bilaterally,  Skin: Warm, dry and intact. No rash, cyanosis, or clubbing.     Labs on Admission:  Basic Metabolic Panel:  Recent Labs Lab 03/25/12 1241  NA 147*  K 3.6  CL 108  CO2 30  GLUCOSE 96  BUN 21  CREATININE 1.38*  CALCIUM 8.6   Liver Function Tests:  Recent Labs Lab 03/25/12 1241  AST 8  ALT 6  ALKPHOS 113  BILITOT 0.4  PROT 6.1  ALBUMIN 3.1*   No results found for this basename: LIPASE, AMYLASE,  in the last 168 hours No results found for this basename: AMMONIA,  in the last 168 hours CBC:  Recent Labs Lab 03/25/12 1241  WBC 10.0  NEUTROABS 7.3  HGB 10.4*  HCT 30.2*  MCV 87.3  PLT 192   Cardiac Enzymes: No results found for this basename: CKTOTAL, CKMB, CKMBINDEX, TROPONINI,  in the last 168 hours  BNP (last 3 results) No results found for this basename: PROBNP,  in the last 8760 hours CBG: No results found for this basename: GLUCAP,  in the last 168  hours  Radiological Exams on Admission: No results found.  EKG: NOT DONE  Assessment/Plan Active Problems:    Rectal bleed: probably diverticular bleed vs mucosal oozing from being on coumadin.  - admit to telemetry for closer monitoring.  - Q8hr H&H - IV protonix - hold coumadin.  - IV vitamin K given by ED.  - transfuse for hEMOGLOBIN < 7.  - clear liquid diet.  GI consultation obtained from Dr Madilyn Fireman.    2. Atrial fibrillation : rate controlled. Coumadin held for  gi bleed.   3. Hypertension; bp meds on hold for borderline bp.  4. Hypernatremia: possibly from dehydration. Fluids.   5. Stage 2 CKD; STABLE. BUN at 21.   6. Anemia: normocytic. Anemia panel sent.   Code Status: DNR confirmed with son.  Family Communication: spoke withson over the phone at [phone] Disposition Plan: 2to 3 days.    [facility] Pager [phone]  If 7PM-7AM, please contact night-coverage [email or web address] Password [facility] 03/25/2012, 4:33 PM

## 2012-03-25 NOTE — ED Provider Notes (Signed)
I saw and evaluated the patient, reviewed the resident's note and I agree with the findings and plan.  Stable lower GI bleed Hgb~10 on Coumadin for Afib; admit to  Med recs Vit K only.  Hurman Horn, MD 03/25/12 2114

## 2012-03-25 NOTE — ED Notes (Signed)
Called flow Production designer, theatre/television/film.  She stated pt is going to 4700, but the room is not clean yet.  Pt becoming anxious.

## 2012-03-25 NOTE — ED Notes (Signed)
Pt sent from [facility] for rectal bleeding starting this am.  Pt with 1 bright red bm this morning in which he experienced pain.  2nd episode was during breakfast, and pt bled through his pants. Per EMS, pt hx of dementia and alert to self.

## 2012-03-25 NOTE — ED Notes (Signed)
Pt with bloody stool with small clots.

## 2012-03-25 NOTE — ED Notes (Signed)
AC notified regarding pt's bed request

## 2012-03-25 NOTE — ED Notes (Signed)
Called pharmacy and they stated they have sent med.  Spoke with son, stated spoke with admitting md.  Await orders.

## 2012-03-25 NOTE — ED Notes (Signed)
Resident at bedside.  

## 2012-03-25 NOTE — ED Notes (Signed)
Dr. Akula at bedside.

## 2012-03-25 NOTE — ED Provider Notes (Signed)
History     CSN: [id number]  Arrival date & time 03/25/12  1231   First MD Initiated Contact with Patient 03/25/12 1327      Chief Complaint  Patient presents with  . Rectal Bleeding    (Consider location/radiation/quality/duration/timing/severity/associated sxs/prior treatment) Patient is a 79 y.o. male presenting with hematochezia. The history is provided by the patient, the EMS personnel and a relative. No language interpreter was used.  Rectal Bleeding  The current episode started today. The problem occurs frequently. The problem has been unchanged. The pain is mild (curently none). The stool is described as bloody. There was no prior successful therapy. There was no prior unsuccessful therapy. Associated symptoms include abdominal pain. Pertinent negatives include no anorexia, no fever, no diarrhea, no hematemesis, no hemorrhoids, no nausea, no rectal pain, no vomiting, no hematuria, no chest pain, no headaches, no coughing and no difficulty breathing. He has been behaving normally. He has been eating and drinking normally. Urine output has been normal. His past medical history is significant for abdominal surgery. Past medical history comments: diverticulitis. There were no sick contacts.    Past Medical History  Diagnosis Date  . Hypertension   . CHF, left ventricular   . Paroxysmal atrial fibrillation   . CHF (congestive heart failure)   . Angina   . Myocardial infarction 2009  . Stroke 2011    residual:  "right sided weakness; problems walking; dementia"  . Dementia     S/P CVA 2011  . Arthritis   . Dysphasia   . A-fib   . GERD (gastroesophageal reflux disease)   . Alzheimer's dementia   . Seizures     epilepsy  . Diverticulitis   . Abdominal abscess     Past Surgical History  Procedure Laterality Date  . Coronary artery bypass graft  2009    CABG X4  . Knee arthroscopy      multiple; bilaterally  . Total knee arthroplasty      bilateral  . Total hip  arthroplasty      right  . Joint replacement      bilateral knees; right hip  . Colon surgery    . Colon resection  1980's    "had colostomy bag for awhile; went back in and put it all back together"  . Colostomy takedown  1980's    Family History  Problem Relation Age of Onset  . Stroke Mother   . Heart attack Father     History  Substance Use Topics  . Smoking status: Never Smoker   . Smokeless tobacco: Never Used  . Alcohol Use: No      Review of Systems  Constitutional: Negative for fever, chills, diaphoresis, activity change, appetite change and fatigue.  HENT: Negative for congestion, sore throat, facial swelling, rhinorrhea, drooling, neck pain and voice change.   Respiratory: Negative for cough, shortness of breath and stridor.   Cardiovascular: Negative for chest pain.  Gastrointestinal: Positive for abdominal pain, hematochezia and anal bleeding. Negative for nausea, vomiting, diarrhea, abdominal distention, rectal pain, anorexia, hematemesis and hemorrhoids.  Endocrine: Negative for polydipsia and polyuria.  Genitourinary: Negative for dysuria, urgency, frequency, hematuria and decreased urine volume.  Musculoskeletal: Negative for back pain and gait problem.  Skin: Negative for color change and wound.  Neurological: Negative for facial asymmetry, weakness, numbness and headaches.  Hematological: Does not bruise/bleed easily.  Psychiatric/Behavioral: Negative for confusion and agitation.    Allergies  Morphine and related  Home Medications  Current Outpatient Rx  Name  Route  Sig  Dispense  Refill  . aspirin EC 81 MG tablet   Oral   Take 81 mg by mouth daily.         Marland Kitchen azelastine (ASTELIN) 137 MCG/SPRAY nasal spray   Nasal   Place 2 sprays into the nose 2 (two) times daily. Use in each nostril as directed         . carvedilol (COREG) 6.25 MG tablet   Oral   Take 6.25 mg by mouth 2 (two) times daily with a meal.         . cyanocobalamin  (,VITAMIN B-12,) 1000 MCG/ML injection   Intramuscular   Inject 1,000 mcg into the muscle every 30 (thirty) days. Give on the 24th of each month         . donepezil (ARICEPT) 10 MG tablet   Oral   Take 10 mg by mouth at bedtime.         . furosemide (LASIX) 40 MG tablet   Oral   Take 40 mg by mouth daily.         . isosorbide mononitrate (IMDUR) 30 MG 24 hr tablet   Oral   Take 15 mg by mouth daily.         Marland Kitchen levETIRAcetam (KEPPRA) 500 MG tablet   Oral   Take 500 mg by mouth 2 (two) times daily.         Marland Kitchen loratadine (CLARITIN) 10 MG tablet   Oral   Take 10 mg by mouth daily.         . nitroGLYCERIN (NITROSTAT) 0.4 MG SL tablet   Sublingual   Place 0.4 mg under the tongue every 5 (five) minutes as needed. For chest pain         . ramipril (ALTACE) 5 MG capsule   Oral   Take 5 mg by mouth 2 (two) times daily.         . sertraline (ZOLOFT) 25 MG tablet   Oral   Take 25 mg by mouth daily.         . sodium chloride (OCEAN) 0.65 % nasal spray   Nasal   Place 2 sprays into the nose daily as needed. For nasal congestion         . Tamsulosin HCl (FLOMAX) 0.4 MG CAPS   Oral   Take 0.4 mg by mouth at bedtime.         Marland Kitchen warfarin (COUMADIN) 3 MG tablet   Oral   Take 3 mg by mouth daily.            BP 117/66  Pulse 74  Temp(Src) 98.3 F (36.8 C) (Oral)  Resp 18  SpO2 99%  Physical Exam  Constitutional: He is oriented to person, place, and time. He appears well-developed and well-nourished. No distress.  HENT:  Head: Normocephalic and atraumatic.  Mouth/Throat: No oropharyngeal exudate.  Eyes: Pupils are equal, round, and reactive to light.  Neck: Normal range of motion. Neck supple.  Cardiovascular: Normal rate, regular rhythm and normal heart sounds.  Exam reveals no gallop and no friction rub.   No murmur heard. Pulmonary/Chest: Effort normal and breath sounds normal. No respiratory distress. He has no wheezes. He has no rales.   Abdominal: Soft. Bowel sounds are normal. He exhibits no distension and no mass. There is no tenderness. There is no rebound and no guarding.  Genitourinary: Guaiac positive stool.  dark red blood per rectum  Musculoskeletal: Normal range of motion. He exhibits no edema and no tenderness.  Neurological: He is alert and oriented to person, place, and time.  Skin: Skin is warm and dry.  Psychiatric: He has a normal mood and affect.    ED Course  Procedures (including critical care time)  Labs Reviewed  CBC WITH DIFFERENTIAL - Abnormal; Notable for the following:    RBC 3.46 (*)    Hemoglobin 10.4 (*)    HCT 30.2 (*)    All other components within normal limits  COMPREHENSIVE METABOLIC PANEL - Abnormal; Notable for the following:    Sodium 147 (*)    Creatinine, Ser 1.38 (*)    Albumin 3.1 (*)    GFR calc non Af Amer 46 (*)    GFR calc Af Amer 53 (*)    All other components within normal limits  PROTIME-INR - Abnormal; Notable for the following:    Prothrombin Time 22.6 (*)    INR 2.09 (*)    All other components within normal limits  OCCULT BLOOD, POC DEVICE - Abnormal; Notable for the following:    Fecal Occult Bld POSITIVE (*)    All other components within normal limits  HEMOGLOBIN AND HEMATOCRIT, BLOOD  TYPE AND SCREEN   No results found.   1. Rectal bleeding       MDM  Pt is a 79 y.o. male with pertinent PMHX of paroxysmal a fib on coumadin, CHF, MI, CVA, demenia, GERD, alzheimer's, diverticulitis who presents with rectal bleeding since this morning. Per reports he had one bloody w/ this morning assoc with ab pain, then second episode during breakfast where he bled through his pants.  Pt has no complaints currently. Denies CP, ab pain, dysuria, hematuria.  BP stable in 120 systolic, abdominal exam benign.  Gross red blood on rectal exam.  Hb 10.4, Cr 1.38, INR 2.09.  Will consult medicine for admission.  Will discuss whether it would be in benefit to reverse INR given  continued bleeding, but stable BP.    3:40 PM Pt has had small bloody BM in ED, but VS remain stable. Pt will be admitted to tele with [facility].  IV vit K given.  1. Rectal bleeding      Labs and imaging considered in decision making, reviewed by myself.  Imaging interpreted by radiology. Pt care discussed with my attending, Dr. Fonnie Jarvis.         Toy Cookey, MD 03/25/12 [id number]

## 2012-03-25 NOTE — ED Notes (Signed)
Pt clean and dry prior to leaving ED

## 2012-03-25 NOTE — ED Notes (Addendum)
Pt becoming increasingly anxious about getting a bed upstairs.  Called flow again and she stated she cannot give a time.  Stated I could call the floor.  Floor states states the charge RN is the only one to give me an answer and she is in another room.

## 2012-03-26 LAB — COMPREHENSIVE METABOLIC PANEL
ALT: <5 U/L (ref 0–53)
AST: 7 U/L (ref 0–37)
Alkaline Phosphatase: 95 U/L (ref 39–117)
CO2: 26 mEq/L (ref 19–32)
Calcium: 8.1 mg/dL — ABNORMAL LOW (ref 8.4–10.5)
GFR calc Af Amer: 55 mL/min — ABNORMAL LOW (ref >90)
GFR calc non Af Amer: 48 mL/min — ABNORMAL LOW (ref >90)
Glucose, Bld: 126 mg/dL — ABNORMAL HIGH (ref 70–99)
Potassium: 3.5 mEq/L (ref 3.5–5.1)
Sodium: 144 mEq/L (ref 135–145)

## 2012-03-26 LAB — CBC
HCT: 22.5 % — ABNORMAL LOW (ref 39.0–52.0)
Hemoglobin: 7.7 g/dL — ABNORMAL LOW (ref 13.0–17.0)
MCH: 30.6 pg (ref 26.0–34.0)
MCHC: 34.2 g/dL (ref 30.0–36.0)
RDW: 13.4 % (ref 11.5–15.5)

## 2012-03-26 LAB — PREPARE RBC (CROSSMATCH)

## 2012-03-26 LAB — GLUCOSE, CAPILLARY
Glucose-Capillary: 93 mg/dL (ref 70–99)
Glucose-Capillary: 102 mg/dL — ABNORMAL HIGH (ref 70–99)
Glucose-Capillary: 106 mg/dL — ABNORMAL HIGH (ref 70–99)

## 2012-03-26 NOTE — Evaluation (Signed)
Physical Therapy Evaluation Patient Details Name: Trevor Webb MRN: [id number] DOB: 05-01-1928 Today's Date: 03/26/2012 Time: 4098-1191 PT Time Calculation (min): 20 min  PT Assessment / Plan / Recommendation Clinical Impression  79 yo male with hx of CVA and RTHR and BTKR admitted with bright red bleeding per rectum.  Unsure of previous functional status at SNF but pt states he is at the level he was before he came in.  He states he does not walk.  Pt is to return to SNF: recommend he be screened by the PT dept there to see if pt is at his baseline for transfers and the moiblity routine the have established there.    PT Assessment  All further PT needs can be met in the next venue of care    Follow Up Recommendations  SNF    Does the patient have the potential to tolerate intense rehabilitation      Barriers to Discharge        Equipment Recommendations  None recommended by PT    Recommendations for Other Services     Frequency      Precautions / Restrictions Precautions Precautions: Fall   Pertinent Vitals/Pain No c/o pain      Mobility  Bed Mobility Bed Mobility: Rolling Left;Left Sidelying to Sit;Sitting - Scoot to Edge of Bed Rolling Left: With rail;4: Min assist Left Sidelying to Sit: 3: Mod assist;With rails Sitting - Scoot to Edge of Bed: 2: Max assist Details for Bed Mobility Assistance: pt is unable to scoot right hip forward to allow him to raise up into sitting on EOB  He is limted by decreased allowable  hip flexion Transfers Transfers: Sit to Stand;Stand to Sit Sit to Stand: From elevated surface;1: +2 Total assist Sit to Stand: Patient Percentage: 20% Stand to Sit: 3: Mod assist Details for Transfer Assistance: pt has to bring left leg underneath him and push up to stand, therefore bringing right leg under him. Once in upright he can stand with min assist Ambulation/Gait Ambulation/Gait Assistance: 4: Min assist Ambulation Distance (Feet): 10  Feet Assistive device: Rolling walker Ambulation/Gait Assistance Details: assist for balance in upright Gait Pattern: Step-to pattern;Right hip hike;Decreased step length - left;Decreased stance time - right;Right circumduction;Trunk rotated posteriorly on right Gait velocity: decrased General Gait Details: pt maintains hip and knee in extension and has to circumduct leg in order to advance it. He is able to weight bear through right leg Stairs: No Wheelchair Mobility Wheelchair Mobility: No    Exercises     PT Diagnosis: Difficulty walking;Abnormality of gait;Generalized weakness;Hemiplegia dominant side  PT Problem List: Decreased strength;Decreased range of motion;Decreased balance;Decreased mobility PT Treatment Interventions:     PT Goals    Visit Information  Last PT Received On: 03/26/12 Assistance Needed: +2    Subjective Data  Subjective: I can't do anything with this right leg Patient Stated Goal: not stated   Prior Functioning  Home Living Type of Home: Skilled Nursing Facility Prior Function Level of Independence: Needs assistance Able to Take Stairs?: No Driving: No Communication Communication: No difficulties    Cognition  Cognition Overall Cognitive Status: No family/caregiver present to determine baseline cognitive functioning Area of Impairment: Memory;Safety/judgement;Problem solving Arousal/Alertness: Awake/alert Orientation Level: Disoriented to;Place;Time;Situation Behavior During Session: WFL for tasks performed Memory Deficits: pt talking at random about events in his past, corrects himself, can't remember details Awareness of Errors: Assistance required to correct errors made Cognition - Other Comments: pt very pleasant, cooperative with one  step commands    Extremity/Trunk Assessment Right Lower Extremity Assessment RLE ROM/Strength/Tone: Deficits RLE ROM/Strength/Tone Deficits: pt with decreased flexion and hip and knee. He has history of  total  hip and knee and hemiparesis all affecting his functional ability with this limb Left Lower Extremity Assessment LLE ROM/Strength/Tone: WFL for tasks assessed LLE Sensation: WFL - Light Touch;WFL - Proprioception Trunk Assessment Trunk Assessment: Normal   Balance Static Sitting Balance Static Sitting - Balance Support: Left upper extremity supported Static Sitting - Level of Assistance: 3: Mod assist Static Sitting - Comment/# of Minutes: pt with tendency to lean backward and has is unable to fully weight bear through right hip in sitting due to decreased flexion Static Standing Balance Static Standing - Balance Support: Bilateral upper extremity supported Static Standing - Level of Assistance: 4: Min assist  End of Session PT - End of Session Activity Tolerance: Patient tolerated treatment well Patient left: in bed Nurse Communication: Mobility status  GP   Rosey Bath K. Manson Passey, [place] [phone] 03/26/2012, 10:27 AM

## 2012-03-26 NOTE — Clinical Documentation Improvement (Signed)
Anemia Blood Loss Clarification  THIS DOCUMENT IS NOT A PERMANENT PART OF THE MEDICAL RECORD  RESPOND TO THE THIS QUERY, FOLLOW THE INSTRUCTIONS BELOW:  1. If needed, update documentation for the patient's encounter via the notes activity.  2. Access this query again and click edit on the In [employer].  3. After updating, or not, click F2 to complete all highlighted (required) fields concerning your review. Select "additional documentation in the medical record" OR "no additional documentation provided".  4. Click Sign note button.  5. The deficiency will fall out of your In Basket *Please let us know if you are not able to complete this workflow by phone or e-mail (listed below).        03/26/12  Dear Trevor Webb  In an effort to better capture your patient's severity of illness, reflect appropriate length of stay and utilization of resources, a review of the patient medical record has revealed the following indicators.    Based on your clinical judgment, please clarify and document in a progress note and/or discharge summary the clinical condition associated with the following supporting information:  In responding to this query please exercise your independent judgment.  The fact that a query is asked, does not imply that any particular answer is desired or expected.   Possible Clinical Conditions?   " Acute Blood Loss Anemia  " Acute on chronic blood loss anemia  " Precipitous drop in Hematocrit  " Other Condition  " Cannot Clinically Determine   Risk Factors:  H/H down to 7.7 from 10.4 per 3/07 progress notes. Anemia, normocytic noted per 3/07 progress notes.  Rectal bleed, probably diverticular vs mucosal oozing secondary to coumadin noted per 3/07 progress notes.   Diagnostics: H&H on 3/06:  10.4/30.2 H&H on 3/07:   7.7/22.5  Treatments: Transfusion:   To transfuse 2units PRBCs per 3/07 progress notes   Reviewed:  No additional documentation  provided.                                          Thank You,  Marciano Sequin,  Clinical Documentation Specialist:  Pager: [phone]  Phone: [phone]  Health Information Management [place]

## 2012-03-26 NOTE — Progress Notes (Signed)
Central monitoring notified the nurse to make him aware that the patient experienced 2 mutifocal pair of V-Tach. The nurse checked in on the patient, and discovered that the patient was asymptomatic. Trevor Webb

## 2012-03-26 NOTE — Clinical Documentation Improvement (Signed)
CHF DOCUMENTATION CLARIFICATION QUERY  THIS DOCUMENT IS NOT A PERMANENT PART OF THE MEDICAL RECORD  TO RESPOND TO THE THIS QUERY, FOLLOW THE INSTRUCTIONS BELOW:  1. If needed, update documentation for the patient's encounter via the notes activity.  2. Access this query again and click edit on the In [employer].  3. After updating, or not, click F2 to complete all highlighted (required) fields concerning your review. Select "additional documentation in the medical record" OR "no additional documentation provided".  4. Click Sign note button.  5. The deficiency will fall out of your In Basket *Please let us know if you are not able to complete this workflow by phone or e-mail (listed below).  Please update your documentation within the medical record to reflect your response to this query.                                                                                    03/26/12  Dear Dr.[facility]rol[facility],  In a better effort to capture your patient's severity of illness, reflect appropriate length of stay and utilization of resources, a review of the patient medical record has revealed the following indicators the diagnosis of Heart Failure.    Based on your clinical judgment, please clarify and document in a progress note and/or discharge summary the clinical condition associated with the following supporting information:  In responding to this query please exercise your independent judgment.  The fact that a query is asked, does not imply that any particular answer is desired or expected.   Possible Clinical Conditions?  Chronic Systolic Congestive Heart Failure Chronic Diastolic Congestive Heart Failure Chronic Systolic & Diastolic Congestive Heart Failure Acute Systolic Congestive Heart Failure Acute Diastolic Congestive Heart Failure Acute Systolic & Diastolic Congestive Heart Failure Acute on Chronic Systolic Congestive Heart Failure Acute on Chronic Diastolic Congestive  Heart Failure Acute on Chronic Systolic & Diastolic  Congestive Heart Failure Other Condition Cannot Clinically Determine    Risk Factors: History of CHF noted per 3/07 progress notes.  Treatment: Home medication list includes Lasix 40mg  po daily.  Reviewed:  No additional documentation provided.                                    Thank You,  Rodman Pickle, RN,BSN,  Clinical Documentation Specialist:  Pager: [phone]  Phone: [phone]  Health Information Management [place]

## 2012-03-26 NOTE — Progress Notes (Addendum)
[facility] PROGRESS NOTE  Trevor Webb ZOX:[id number] DOB: 05-23-1928 DOA: 03/25/2012 PCP: Darlina Guys, MD  Assessment/Plan: Active Problems:   Hypertension   Paroxysmal atrial fibrillation   Dementia   CKD (chronic kidney disease), stage II   Rectal bleed     Rectal bleed: probably diverticular bleed vs mucosal oozing from being on coumadin.  - admit to telemetry for closer monitoring.  - Q8hr H&H  - IV protonix  - hold coumadin.  - IV vitamin K given by ED.  - Hemoglobin has dropped to 7.7 from 10.4 with transfuse 2 units of packed red blood cells and repeat INR  - clear liquid diet.  GI consultation obtained from Dr Madilyn Fireman.   2. Atrial fibrillation : rate controlled. Coumadin held for gi bleed.  3. Hypertension; bp meds on hold for borderline bp.  4. Hypernatremia: possibly from dehydration. Fluids.  5. Stage 2 CKD; STABLE. BUN at 21.  6. Anemia: normocytic. Anemia panel sent.    Code Status: DNR confirmed with son.  Family Communication: spoke withson over the phone at [phone]  Disposition Plan: 2to 3 days.      Brief narrative: Trevor Webb is a 79 y.o. male with prior h/o diverticulitis, diverticulosis, atrial fibrillation on coumadin, CHF, hypertension, was brought in from SNF for 2 episodes of bright red blood per rectum. His H&H 10.4/ 30.2 is slightly low from baseline of 12. He is hemodynamically stable, but very confused to give any kind of history. Most of the history was obtained from the son and the ED notes. He is being admitted for evaluation of GI bleed.    Consultants:  GI  Procedures:  None  Antibiotics:  None  HPI/Subjective: Denied any episodes of bleeding overnight  Objective: Filed Vitals:   03/25/12 1930 03/25/12 2042 03/25/12 2100 03/26/12 0407  BP:  120/75 122/56 97/60  Pulse: 98  58 55  Temp:   97.9 F (36.6 C) 97.9 F (36.6 C)  TempSrc:   Oral Oral  Resp: 18 20 20 20   Height:   6\' 2"  (1.88 m)   SpO2:  100% 96% 99% 98%    Intake/Output Summary (Last 24 hours) at 03/26/12 1038 Last data filed at 03/26/12 0900  Gross per 24 hour  Intake    240 ml  Output    650 ml  Net   -410 ml    Exam: Eyes: PERRL, EOMI, conjunctivae normal, No scleral icterus.  Neck: Supple, Trachea midline normal ROM, No JVD, mass, thyromegaly, or carotid bruit present.  Cardiovascular: RRR, S1 normal, S2 normal, no MRG, pulses symmetric and intact bilaterally  Pulmonary/Chest: CTAB, no wheezes, rales, or rhonchi  Abdominal: Soft. Mildly tender bowel sounds are normal, no masses, organomegaly, or guarding present.  Musculoskeletal: No joint deformities, erythema, or stiffness, ROM full and no nontender Hematology: no cervical, inginal, or axillary adenopathy.  Neurological: Alert but confused strength is normal and symmetric bilaterally,  Skin: Warm, dry and intact. No rash, cyanosis, or clubbing    Data Reviewed: Basic Metabolic Panel:  Recent Labs Lab 03/25/12 1241 03/26/12 0814  NA 147* 144  K 3.6 3.5  CL 108 108  CO2 30 26  GLUCOSE 96 126*  BUN 21 29*  CREATININE 1.38* 1.34  CALCIUM 8.6 8.1*    Liver Function Tests:  Recent Labs Lab 03/25/12 1241 03/26/12 0814  AST 8 7  ALT 6 <5  ALKPHOS 113 95  BILITOT 0.4 0.5  PROT 6.1 5.2*  ALBUMIN 3.1*  2.7*   No results found for this basename: LIPASE, AMYLASE,  in the last 168 hours No results found for this basename: AMMONIA,  in the last 168 hours  CBC:  Recent Labs Lab 03/25/12 1241 03/25/12 2345 03/26/12 0814  WBC 10.0  --  8.1  NEUTROABS 7.3  --   --   HGB 10.4* 7.6* 7.7*  HCT 30.2* 21.9* 22.5*  MCV 87.3  --  89.3  PLT 192  --  149*    Cardiac Enzymes: No results found for this basename: CKTOTAL, CKMB, CKMBINDEX, TROPONINI,  in the last 168 hours BNP (last 3 results)  Recent Labs  03/25/12 2225  PROBNP 4380.0*     CBG:  Recent Labs Lab 03/26/12 0716  GLUCAP 93    No results found for this or any previous  visit (from the past 240 hour(s)).   Studies: No results found.  Scheduled Meds: . azelastine  2 spray Each Nare BID  . carvedilol  6.25 mg Oral BID WC  . donepezil  10 mg Oral QHS  . levETIRAcetam  500 mg Oral BID  . loratadine  10 mg Oral Daily  . pantoprazole (PROTONIX) IV  40 mg Intravenous Q24H  . sertraline  25 mg Oral Daily  . sodium chloride  3 mL Intravenous Q12H  . tamsulosin  0.4 mg Oral QHS   Continuous Infusions:   Active Problems:   Hypertension   Paroxysmal atrial fibrillation   Dementia   CKD (chronic kidney disease), stage II   Rectal bleed    Time spent: 40 minutes   [facility]  [facility] Pager [phone]. If 8PM-8AM, please contact night-coverage at [email or web address], password [facility] 03/26/2012, 10:38 AM  LOS: 1 day

## 2012-03-26 NOTE — Progress Notes (Signed)
Utilization Review Completed.Trevor Webb T3/07/2012  

## 2012-03-26 NOTE — Consult Note (Signed)
[facility] Consult Note  Referring Provider: No ref. provider found Primary Care Physician:  Darlina Guys, MD Primary Gastroenterologist:  Dr.  Antony Contras Complaint: Rectal bleeding HPI: Trevor Webb is an 79 y.o. white male  who presents with a one-day history of painless hematochezia several episodes witnessed at the nursing home. He is not having any hemodynamic instability and has not had a bowel movement since last night I here in the hospital. He has had a history of some sort of diverticular event in the remote past requiring a temporary colostomy and also was apparently treated for a diverticular abscess in the last 2 or 3 years. He is seeing Dr. Matthias Hughs but I am unable to determine when his last colonoscopy was.  Past Medical History  Diagnosis Date  . Hypertension   . CHF, left ventricular   . Paroxysmal atrial fibrillation   . CHF (congestive heart failure)   . Angina   . Myocardial infarction 2009  . Stroke 2011    residual:  "right sided weakness; problems walking; dementia"  . Dementia     S/P CVA 2011  . Arthritis   . Dysphasia   . A-fib   . GERD (gastroesophageal reflux disease)   . Alzheimer's dementia   . Seizures     epilepsy  . Diverticulitis   . Abdominal abscess     Past Surgical History  Procedure Laterality Date  . Coronary artery bypass graft  2009    CABG X4  . Knee arthroscopy      multiple; bilaterally  . Total knee arthroplasty      bilateral  . Total hip arthroplasty      right  . Joint replacement      bilateral knees; right hip  . Colon surgery    . Colon resection  1980's    "had colostomy bag for awhile; went back in and put it all back together"  . Colostomy takedown  1980's    Medications Prior to Admission  Medication Sig Dispense Refill  . aspirin EC 81 MG tablet Take 81 mg by mouth daily.      Marland Kitchen azelastine (ASTELIN) 137 MCG/SPRAY nasal spray Place 2 sprays into the nose 2 (two) times daily. Use in each  nostril as directed      . carvedilol (COREG) 6.25 MG tablet Take 6.25 mg by mouth 2 (two) times daily with a meal.      . cyanocobalamin (,VITAMIN B-12,) 1000 MCG/ML injection Inject 1,000 mcg into the muscle every 30 (thirty) days. Give on the 24th of each month      . donepezil (ARICEPT) 10 MG tablet Take 10 mg by mouth at bedtime.      . furosemide (LASIX) 40 MG tablet Take 40 mg by mouth daily.      . isosorbide mononitrate (IMDUR) 30 MG 24 hr tablet Take 15 mg by mouth daily.      Marland Kitchen levETIRAcetam (KEPPRA) 500 MG tablet Take 500 mg by mouth 2 (two) times daily.      Marland Kitchen loratadine (CLARITIN) 10 MG tablet Take 10 mg by mouth daily.      . nitroGLYCERIN (NITROSTAT) 0.4 MG SL tablet Place 0.4 mg under the tongue every 5 (five) minutes as needed. For chest pain      . ramipril (ALTACE) 5 MG capsule Take 5 mg by mouth 2 (two) times daily.      . sertraline (ZOLOFT) 25 MG tablet Take 25 mg by mouth daily.      Marland Kitchen  sodium chloride (OCEAN) 0.65 % nasal spray Place 2 sprays into the nose daily as needed. For nasal congestion      . Tamsulosin HCl (FLOMAX) 0.4 MG CAPS Take 0.4 mg by mouth at bedtime.      Marland Kitchen warfarin (COUMADIN) 3 MG tablet Take 3 mg by mouth daily.         Allergies:  Allergies  Allergen Reactions  . Morphine And Related     Delusions    Family History  Problem Relation Age of Onset  . Stroke Mother   . Heart attack Father     Social History:  reports that he has never smoked. He has never used smokeless tobacco. He reports that he does not drink alcohol or use illicit drugs.  Review of Systems: negative except as above   Blood pressure 97/60, pulse 55, temperature 97.9 F (36.6 C), temperature source Oral, resp. rate 20, height 6\' 2"  (1.88 m), SpO2 98.00%. Head: Normocephalic, without obvious abnormality, atraumatic Neck: no adenopathy, no carotid bruit, no JVD, supple, symmetrical, trachea midline and thyroid not enlarged, symmetric, no tenderness/mass/nodules Resp:  clear to auscultation bilaterally Cardio: regular rate and rhythm, S1, S2 normal, no murmur, click, rub or gallop GI: Abdomen soft nondistended with normoactive bowel sounds. No hepatomegaly mass or guarding Extremities: extremities normal, atraumatic, no cyanosis or edema  Results for orders placed during the hospital encounter of 03/25/12 (from the past 48 hour(s))  CBC WITH DIFFERENTIAL     Status: Abnormal   Collection Time    03/25/12 12:41 PM      Result Value Range   WBC 10.0  4.0 - 10.5 K/uL   RBC 3.46 (*) 4.22 - 5.81 MIL/uL   Hemoglobin 10.4 (*) 13.0 - 17.0 g/dL   HCT 40.9 (*) 81.1 - 91.4 %   MCV 87.3  78.0 - 100.0 fL   MCH 30.1  26.0 - 34.0 pg   MCHC 34.4  30.0 - 36.0 g/dL   RDW 78.2  95.6 - 21.3 %   Platelets 192  150 - 400 K/uL   Neutrophils Relative 73  43 - 77 %   Neutro Abs 7.3  1.7 - 7.7 K/uL   Lymphocytes Relative 17  12 - 46 %   Lymphs Abs 1.7  0.7 - 4.0 K/uL   Monocytes Relative 9  3 - 12 %   Monocytes Absolute 0.9  0.1 - 1.0 K/uL   Eosinophils Relative 2  0 - 5 %   Eosinophils Absolute 0.2  0.0 - 0.7 K/uL   Basophils Relative 0  0 - 1 %   Basophils Absolute 0.0  0.0 - 0.1 K/uL  COMPREHENSIVE METABOLIC PANEL     Status: Abnormal   Collection Time    03/25/12 12:41 PM      Result Value Range   Sodium 147 (*) 135 - 145 mEq/L   Potassium 3.6  3.5 - 5.1 mEq/L   Chloride 108  96 - 112 mEq/L   CO2 30  19 - 32 mEq/L   Glucose, Bld 96  70 - 99 mg/dL   BUN 21  6 - 23 mg/dL   Creatinine, Ser 0.86 (*) 0.50 - 1.35 mg/dL   Calcium 8.6  8.4 - 57.8 mg/dL   Total Protein 6.1  6.0 - 8.3 g/dL   Albumin 3.1 (*) 3.5 - 5.2 g/dL   AST 8  0 - 37 U/L   ALT 6  0 - 53 U/L   Alkaline Phosphatase 113  39 - 117 U/L   Total Bilirubin 0.4  0.3 - 1.2 mg/dL   GFR calc non Af Amer 46 (*) >90 mL/min   GFR calc Af Amer 53 (*) >90 mL/min   Comment:            The eGFR has been calculated     using the CKD EPI equation.     This calculation has not been     validated in all clinical      situations.     eGFR's persistently     <90 mL/min signify     possible Chronic Kidney Disease.  PROTIME-INR     Status: Abnormal   Collection Time    03/25/12 12:41 PM      Result Value Range   Prothrombin Time 22.6 (*) 11.6 - 15.2 seconds   INR 2.09 (*) 0.00 - 1.49  TYPE AND SCREEN     Status: None   Collection Time    03/25/12  1:10 PM      Result Value Range   ABO/RH(D) O NEG     Antibody Screen NEG     Sample Expiration 03/28/2012    OCCULT BLOOD, POC DEVICE     Status: Abnormal   Collection Time    03/25/12  1:57 PM      Result Value Range   Fecal Occult Bld POSITIVE (*) NEGATIVE  PRO B NATRIURETIC PEPTIDE     Status: Abnormal   Collection Time    03/25/12 10:25 PM      Result Value Range   Pro B Natriuretic peptide (BNP) 4380.0 (*) 0 - 450 pg/mL  HEMOGLOBIN AND HEMATOCRIT, BLOOD     Status: Abnormal   Collection Time    03/25/12 11:45 PM      Result Value Range   Hemoglobin 7.6 (*) 13.0 - 17.0 g/dL   Comment: DELTA CHECK NOTED     REPEATED TO VERIFY   HCT 21.9 (*) 39.0 - 52.0 %  GLUCOSE, CAPILLARY     Status: None   Collection Time    03/26/12  7:16 AM      Result Value Range   Glucose-Capillary 93  70 - 99 mg/dL   No results found.  Assessment: Painless hematochezia most consistent with lower GI bleed in a patient on Coumadin Plan:  Will monitor stools and hemoglobin and research when his last colonoscopy was in determine whether repeat study is indicated based on these 2 factors. We'll keep on clear liquid diet for now. Arwa Yero C 03/26/2012, 8:36 AM

## 2012-03-26 NOTE — Progress Notes (Signed)
Nurse spoke with Coralie Carpen about 2019 pairs of V-Tach. Coralie Carpen gave no orders. Harmon Pier

## 2012-03-26 NOTE — Progress Notes (Signed)
Occupational Therapy Note  OT order received and appreciated.  Per chart review and PT report, pt is from SNF and currently at baseline level. Plans to return to SNF for d/c plan.  Will defer OT eval to next venue of care.  Signing off.  03/26/2012 Cipriano Mile OTR/L Pager [phone] Office [phone]

## 2012-03-27 ENCOUNTER — Inpatient Hospital Stay (HOSPITAL_COMMUNITY): Payer: Medicare PPO

## 2012-03-27 LAB — TYPE AND SCREEN
ABO/RH(D): O NEG
Unit division: 0

## 2012-03-27 LAB — BASIC METABOLIC PANEL
CO2: 26 mEq/L (ref 19–32)
Chloride: 110 mEq/L (ref 96–112)
GFR calc Af Amer: 58 mL/min — ABNORMAL LOW (ref >90)
Potassium: 3.4 mEq/L — ABNORMAL LOW (ref 3.5–5.1)

## 2012-03-27 LAB — PROTIME-INR
INR: 1.42 (ref 0.00–1.49)
Prothrombin Time: 17 seconds — ABNORMAL HIGH (ref 11.6–15.2)

## 2012-03-27 LAB — CBC
HCT: 24 % — ABNORMAL LOW (ref 39.0–52.0)
Hemoglobin: 8.5 g/dL — ABNORMAL LOW (ref 13.0–17.0)
RBC: 2.77 MIL/uL — ABNORMAL LOW (ref 4.22–5.81)

## 2012-03-27 LAB — GLUCOSE, CAPILLARY: Glucose-Capillary: 96 mg/dL (ref 70–99)

## 2012-03-27 MED ORDER — POTASSIUM CHLORIDE CRYS ER 20 MEQ PO TBCR
40.0000 meq | EXTENDED_RELEASE_TABLET | Freq: Two times a day (BID) | ORAL | Status: AC
  Administered 2012-03-27 (×2): 40 meq via ORAL
  Filled 2012-03-27 (×2): qty 2

## 2012-03-27 NOTE — Progress Notes (Signed)
[facility] PROGRESS NOTE  Trevor Webb ZOX:[id number] DOB: [birth year] DOA: 03/25/2012 PCP: Darlina Guys, MD  Assessment/Plan: Active Problems:   Hypertension   Paroxysmal atrial fibrillation   Dementia   CKD (chronic kidney disease), stage II   Rectal bleed      Rectal bleed: probably diverticular bleed vs mucosal oozing from being on coumadin.  continuetelemetry for closer monitoring.  Post transfusion hemoglobin of 8.5 - hold coumadin.  - IV vitamin K given by ED.  - Hemoglobin has dropped to 7.7 from 10.4 , INR 1.42- clear liquid diet.  GI consultation obtained from Dr Madilyn Fireman.  Possible colonoscopy during this admission  2. Atrial fibrillation : rate controlled. Coumadin held for gi bleed.  3. Hypertension; bp meds on hold for borderline bp.  4. Hypernatremia: possibly from dehydration. Fluids.  5. Stage 2 CKD; STABLE. BUN at 21.  6. Anemia: normocytic. Anemia panel sent.    Code Status: DNR confirmed with son.  Family Communication: spoke withson over the phone at [phone]  Disposition Plan: 2to 3 days.   Brief narrative:  Trevor Webb is a 79 y.o. male with prior h/o diverticulitis, diverticulosis, atrial fibrillation on coumadin, CHF, hypertension, was brought in from SNF for 2 episodes of bright red blood per rectum. His H&H 10.4/ 30.2 is slightly low from baseline of 12. He is hemodynamically stable, but very confused to give any kind of history. Most of the history was obtained from the son and the ED notes. He is being admitted for evaluation of GI bleed.  Consultants:  GI Procedures:  None Antibiotics:  None   HPI/Subjective: No further bleeding overnight  Objective: Filed Vitals:   03/26/12 1900 03/26/12 2000 03/26/12 2020 03/27/12 0500  BP: 145/73 135/78 133/76 116/72  Pulse: 76 70 72 89  Temp: 99 F (37.2 C) 98 F (36.7 C) 98.2 F (36.8 C) 97.5 F (36.4 C)  TempSrc: Oral Oral Oral Oral  Resp:  18 18 18   Height:      Weight:     84.6 kg (186 lb 8.2 oz)  SpO2: 98%   97%    Intake/Output Summary (Last 24 hours) at 03/27/12 0903 Last data filed at 03/27/12 0519  Gross per 24 hour  Intake    250 ml  Output    250 ml  Net      0 ml    Exam:  HENT:  Head: Atraumatic.  Nose: Nose normal.  Mouth/Throat: Oropharynx is clear and moist.  Eyes: Conjunctivae are normal. Pupils are equal, round, and reactive to light. No scleral icterus.  Neck: Neck supple. No tracheal deviation present.  Cardiovascular: Normal rate, regular rhythm, normal heart sounds and intact distal pulses.  Pulmonary/Chest: Effort normal and breath sounds normal. No respiratory distress.  Abdominal: Soft. Normal appearance and bowel sounds are normal. She exhibits no distension. There is no tenderness.  Musculoskeletal: She exhibits no edema and no tenderness.  Neurological: She is alert. No cranial nerve deficit.    Data Reviewed: Basic Metabolic Panel:  Recent Labs Lab 03/25/12 1241 03/26/12 0814 03/27/12 0535  NA 147* 144 144  K 3.6 3.5 3.4*  CL 108 108 110  CO2 30 26 26   GLUCOSE 96 126* 88  BUN 21 29* 28*  CREATININE 1.38* 1.34 1.29  CALCIUM 8.6 8.1* 7.9*    Liver Function Tests:  Recent Labs Lab 03/25/12 1241 03/26/12 0814  AST 8 7  ALT 6 <5  ALKPHOS 113 95  BILITOT 0.4 0.5  PROT 6.1 5.2*  ALBUMIN 3.1* 2.7*   No results found for this basename: LIPASE, AMYLASE,  in the last 168 hours No results found for this basename: AMMONIA,  in the last 168 hours  CBC:  Recent Labs Lab 03/25/12 1241 03/25/12 2345 03/26/12 0814 03/27/12 0535  WBC 10.0  --  8.1 8.1  NEUTROABS 7.3  --   --   --   HGB 10.4* 7.6* 7.7* 8.5*  HCT 30.2* 21.9* 22.5* 24.0*  MCV 87.3  --  89.3 86.6  PLT 192  --  149* 128*    Cardiac Enzymes: No results found for this basename: CKTOTAL, CKMB, CKMBINDEX, TROPONINI,  in the last 168 hours BNP (last 3 results)  Recent Labs  03/25/12 2225  PROBNP 4380.0*     CBG:  Recent Labs Lab  03/26/12 0716 03/26/12 1117 03/26/12 1612 03/26/12 2147 03/27/12 0740  GLUCAP 93 102* 106* 101* 86    No results found for this or any previous visit (from the past 240 hour(s)).   Studies: No results found.  Scheduled Meds: . azelastine  2 spray Each Nare BID  . carvedilol  6.25 mg Oral BID WC  . donepezil  10 mg Oral QHS  . levETIRAcetam  500 mg Oral BID  . loratadine  10 mg Oral Daily  . pantoprazole (PROTONIX) IV  40 mg Intravenous Q24H  . sertraline  25 mg Oral Daily  . sodium chloride  3 mL Intravenous Q12H  . tamsulosin  0.4 mg Oral QHS   Continuous Infusions:   Active Problems:   Hypertension   Paroxysmal atrial fibrillation   Dementia   CKD (chronic kidney disease), stage II   Rectal bleed    Time spent: 40 minutes   [facility]  [facility] Pager [phone]. If 8PM-8AM, please contact night-coverage at [email or web address], password [facility] 03/27/2012, 9:03 AM  LOS: 2 days

## 2012-03-28 LAB — CBC
HCT: 24.3 % — ABNORMAL LOW (ref 39.0–52.0)
Hemoglobin: 8.5 g/dL — ABNORMAL LOW (ref 13.0–17.0)
MCV: 86.2 fL (ref 78.0–100.0)
RDW: 14.2 % (ref 11.5–15.5)
WBC: 7.6 10*3/uL (ref 4.0–10.5)

## 2012-03-28 LAB — GLUCOSE, CAPILLARY: Glucose-Capillary: 84 mg/dL (ref 70–99)

## 2012-03-28 LAB — PROTIME-INR: INR: 1.49 (ref 0.00–1.49)

## 2012-03-28 NOTE — Progress Notes (Signed)
[facility] PROGRESS NOTE  [name] WUX:[id number] DOB: [birth year] DOA: 03/25/2012 PCP: Darlina Guys, MD  Assessment/Plan: Active Problems:   Hypertension   Paroxysmal atrial fibrillation   Dementia   CKD (chronic kidney disease), stage II   Rectal bleed     Rectal bleed: Awaiting further plan from Dr. Madilyn Fireman probably diverticular bleed vs mucosal oozing from being on coumadin.  continuetelemetry for closer monitoring.  Post transfusion hemoglobin of 8.5  Holding coumadin.  - IV vitamin K given by ED.  - Hemoglobin has dropped to 7.7 from 10.4 , INR 1.49- clear liquid diet.  GI consultation obtained from Dr Madilyn Fireman.  Possible colonoscopy during this admission   2. Atrial fibrillation : rate controlled. Coumadin held for gi bleed.  3. Hypertension; bp meds on hold for borderline bp.  4. Hypernatremia: possibly from dehydration. Fluids.  5. Stage 2 CKD; STABLE. BUN at 21.  6. Anemia: normocytic. Stable  Code Status: DNR confirmed with son.  Family Communication: spoke withson over the phone at [phone]  Disposition Plan: 2to 3 days.   Brief narrative:  Trevor Webb is a 79 y.o. male with prior h/o diverticulitis, diverticulosis, atrial fibrillation on coumadin, CHF, hypertension, was brought in from SNF for 2 episodes of bright red blood per rectum. His H&H 10.4/ 30.2 is slightly low from baseline of 12. He is hemodynamically stable, but very confused to give any kind of history. Most of the history was obtained from the son and the ED notes. He is being admitted for evaluation of GI bleed.  Consultants:  GI Procedures:  None Antibiotics:  None  HPI/Subjective:  No further bleeding overnight   Objective: Filed Vitals:   03/27/12 0500 03/27/12 1426 03/27/12 2013 03/28/12 0508  BP: 116/72 121/75 123/70 132/65  Pulse: 89 80 109 62  Temp: 97.5 F (36.4 C) 98 F (36.7 C) 97.5 F (36.4 C) 98.1 F (36.7 C)  TempSrc: Oral Oral Oral Oral  Resp: 18 18  18 19   Height:      Weight: 84.6 kg (186 lb 8.2 oz)     SpO2: 97% 97% 98% 98%    Intake/Output Summary (Last 24 hours) at 03/28/12 0944 Last data filed at 03/28/12 0516  Gross per 24 hour  Intake    240 ml  Output   1925 ml  Net  -1685 ml    Exam:  HENT:  Head: Atraumatic.  Nose: Nose normal.  Mouth/Throat: Oropharynx is clear and moist.  Eyes: Conjunctivae are normal. Pupils are equal, round, and reactive to light. No scleral icterus.  Neck: Neck supple. No tracheal deviation present.  Cardiovascular: Normal rate, regular rhythm, normal heart sounds and intact distal pulses.  Pulmonary/Chest: Effort normal and breath sounds normal. No respiratory distress.  Abdominal: Soft. Normal appearance and bowel sounds are normal. She exhibits no distension. There is no tenderness.  Musculoskeletal: She exhibits no edema and no tenderness.  Neurological: She is alert. No cranial nerve deficit.    Data Reviewed: Basic Metabolic Panel:  Recent Labs Lab 03/25/12 1241 03/26/12 0814 03/27/12 0535  NA 147* 144 144  K 3.6 3.5 3.4*  CL 108 108 110  CO2 30 26 26   GLUCOSE 96 126* 88  BUN 21 29* 28*  CREATININE 1.38* 1.34 1.29  CALCIUM 8.6 8.1* 7.9*    Liver Function Tests:  Recent Labs Lab 03/25/12 1241 03/26/12 0814  AST 8 7  ALT 6 <5  ALKPHOS 113 95  BILITOT 0.4 0.5  PROT  6.1 5.2*  ALBUMIN 3.1* 2.7*   No results found for this basename: LIPASE, AMYLASE,  in the last 168 hours No results found for this basename: AMMONIA,  in the last 168 hours  CBC:  Recent Labs Lab 03/25/12 1241 03/25/12 2345 03/26/12 0814 03/27/12 0535 03/28/12 0555  WBC 10.0  --  8.1 8.1 7.6  NEUTROABS 7.3  --   --   --   --   HGB 10.4* 7.6* 7.7* 8.5* 8.5*  HCT 30.2* 21.9* 22.5* 24.0* 24.3*  MCV 87.3  --  89.3 86.6 86.2  PLT 192  --  149* 128* 145*    Cardiac Enzymes: No results found for this basename: CKTOTAL, CKMB, CKMBINDEX, TROPONINI,  in the last 168 hours BNP (last 3  results)  Recent Labs  03/25/12 2225  PROBNP 4380.0*     CBG:  Recent Labs Lab 03/26/12 2147 03/27/12 0740 03/27/12 1115 03/27/12 1614 03/28/12 0507  GLUCAP 101* 86 97 96 84    No results found for this or any previous visit (from the past 240 hour(s)).   Studies: No results found.  Scheduled Meds: . azelastine  2 spray Each Nare BID  . carvedilol  6.25 mg Oral BID WC  . donepezil  10 mg Oral QHS  . levETIRAcetam  500 mg Oral BID  . loratadine  10 mg Oral Daily  . pantoprazole (PROTONIX) IV  40 mg Intravenous Q24H  . sertraline  25 mg Oral Daily  . sodium chloride  3 mL Intravenous Q12H  . tamsulosin  0.4 mg Oral QHS   Continuous Infusions:   Active Problems:   Hypertension   Paroxysmal atrial fibrillation   Dementia   CKD (chronic kidney disease), stage II   Rectal bleed    Time spent: 40 minutes   [facility]  [facility] Pager [phone]. If 8PM-8AM, please contact night-coverage at [email or web address], password [facility] 03/28/2012, 9:44 AM  LOS: 3 days

## 2012-03-28 NOTE — Progress Notes (Signed)
Pt up to chair with assist X 2. Chair alarm placed. Call bell within reach. Will continue to monitor

## 2012-03-28 NOTE — Progress Notes (Signed)
[facility] Progress Note  Subjective: Patient has no complaints. Her states he had one very small black stool yesterday with no other stool output since.  Objective: Vital signs in last 24 hours: Temp:  [97.5 F (36.4 C)-98.1 F (36.7 C)] 98.1 F (36.7 C) (03/09 0508) Pulse Rate:  [62-109] 62 (03/09 0508) Resp:  [18-19] 19 (03/09 0508) BP: (121-132)/(65-75) 132/65 mmHg (03/09 0508) SpO2:  [97 %-98 %] 98 % (03/09 0508) Weight change:    PE: Unchanged  Lab Results: Results for orders placed during the hospital encounter of 03/25/12 (from the past 24 hour(s))  GLUCOSE, CAPILLARY     Status: None   Collection Time    03/27/12  4:14 PM      Result Value Range   Glucose-Capillary 96  70 - 99 mg/dL  GLUCOSE, CAPILLARY     Status: None   Collection Time    03/28/12  5:07 AM      Result Value Range   Glucose-Capillary 84  70 - 99 mg/dL  CBC     Status: Abnormal   Collection Time    03/28/12  5:55 AM      Result Value Range   WBC 7.6  4.0 - 10.5 K/uL   RBC 2.82 (*) 4.22 - 5.81 MIL/uL   Hemoglobin 8.5 (*) 13.0 - 17.0 g/dL   HCT 16.1 (*) 09.6 - 04.5 %   MCV 86.2  78.0 - 100.0 fL   MCH 30.1  26.0 - 34.0 pg   MCHC 35.0  30.0 - 36.0 g/dL   RDW 40.9  81.1 - 91.4 %   Platelets 145 (*) 150 - 400 K/uL  PROTIME-INR     Status: Abnormal   Collection Time    03/28/12  5:55 AM      Result Value Range   Prothrombin Time 17.6 (*) 11.6 - 15.2 seconds   INR 1.49  0.00 - 1.49    Studies/Results: No results found.    Assessment: Rectal bleeding probably diverticular, on Coumadin, seems to have stopped  Plan: Unfortunately her office computer system is down from maintenance I am unable to determine when his last colonoscopy was but should have this information by tomorrow. Will allow him to have a heart healthy diet today and then put him back on clear liquids after midnight until it is determine whether we need to do a colonoscopy.    HAYES,JOHN C 03/28/2012, 11:38  AM

## 2012-03-29 LAB — CBC
HCT: 25.9 % — ABNORMAL LOW (ref 39.0–52.0)
Hemoglobin: 9.1 g/dL — ABNORMAL LOW (ref 13.0–17.0)
MCHC: 35.1 g/dL (ref 30.0–36.0)
RBC: 2.91 MIL/uL — ABNORMAL LOW (ref 4.22–5.81)

## 2012-03-29 LAB — HEMOGLOBIN A1C: Mean Plasma Glucose: 120 mg/dL — ABNORMAL HIGH (ref <117)

## 2012-03-29 LAB — BASIC METABOLIC PANEL
BUN: 19 mg/dL (ref 6–23)
CO2: 23 mEq/L (ref 19–32)
Chloride: 110 mEq/L (ref 96–112)
Glucose, Bld: 138 mg/dL — ABNORMAL HIGH (ref 70–99)
Potassium: 3.5 mEq/L (ref 3.5–5.1)
Sodium: 143 mEq/L (ref 135–145)

## 2012-03-29 MED ORDER — CARVEDILOL 3.125 MG PO TABS
3.1250 mg | ORAL_TABLET | Freq: Every day | ORAL | Status: DC
Start: 2012-03-30 — End: 2012-03-30
  Administered 2012-03-30: 3.125 mg via ORAL
  Filled 2012-03-29: qty 1

## 2012-03-29 NOTE — Progress Notes (Signed)
[facility] PROGRESS NOTE Subjective patient states that he had only one bowel movement which had no blood. He denies any problem.  Objective: Vital signs in last 24 hours: Temp:  [97.4 F (36.3 C)-98.3 F (36.8 C)] 98 F (36.7 C) (03/10 0440) Pulse Rate:  [56-64] 64 (03/10 0440) Resp:  [18-19] 19 (03/10 0440) BP: (134-146)/(50-71) 134/65 mmHg (03/10 0440) SpO2:  [97 %-98 %] 97 % (03/10 0440) Last BM Date: 03/28/12  Intake/Output from previous day: 03/09 0701 - 03/10 0700 In: 960 [P.O.:960] Out: 1251 [Urine:1250; Stool:1] Intake/Output this shift: Total I/O In: 240 [P.O.:240] Out: -   PE: abdomen -- soft and nontender  Lab Results:  Recent Labs  03/27/12 0535 03/28/12 0555 03/29/12 0800  WBC 8.1 7.6 7.4  HGB 8.5* 8.5* 9.1*  HCT 24.0* 24.3* 25.9*  PLT 128* 145* 167   BMET  Recent Labs  03/27/12 0535 03/29/12 0800  NA 144 143  K 3.4* 3.5  CL 110 110  CO2 26 23  CREATININE 1.29 1.21   LFT No results found for this basename: PROT, AST, ALT, ALKPHOS, BILITOT, BILIDIR, IBILI,  in the last 72 hours PT/INR  Recent Labs  03/27/12 0535 03/28/12 0555  LABPROT 17.0* 17.6*  INR 1.42 1.49   PANCREAS No results found for this basename: LIPASE,  in the last 72 hours       Studies/Results: No results found.  Medications: I have reviewed the patient's current medications.  Assessment/Plan:  G.I. bleeding -- appears to have stopped at this time. Agree with following clinically.  Janee Ureste JR,Konor Noren L 03/29/2012, 11:26 AM

## 2012-03-29 NOTE — Progress Notes (Signed)
Pt heart rate ranging from 35 to 60 bpm A Fib. Pt asymptomatic. MD aware. New order to decrease Coreg to once a day 3.125 mg. Will continue to monitor. Dion Saucier

## 2012-03-29 NOTE — Care Management Note (Unsigned)
    Page 1 of 1   03/29/2012     4:57:13 PM   CARE MANAGEMENT NOTE 03/29/2012  Patient:  Trevor Webb, Trevor Webb   Account Number:  [id number]  Date Initiated:  03/29/2012  Documentation initiated by:  AMERSON,JULIE  Subjective/Objective Assessment:   PT ADM WITH GI BLEED ON 03/25/12.  PTA, PT RESIDES AT [facility] SNF     Action/Plan:   CSW CONSULTED TO FACILITATE RETURN TO SNF WHEN MEDICALLY STABLE FOR DC.   Anticipated DC Date:  03/30/2012   Anticipated DC Plan:  SKILLED NURSING FACILITY  In-house referral  Clinical Social Worker      DC Planning Services  CM consult      Choice offered to / List presented to:             Status of service:  In process, will continue to follow Medicare Important Message given?   (If response is "NO", the following Medicare IM given date fields will be blank) Date Medicare IM given:   Date Additional Medicare IM given:    Discharge Disposition:    Per UR Regulation:  Reviewed for med. necessity/level of care/duration of stay  If discussed at Long Length of Stay Meetings, dates discussed:    Comments:

## 2012-03-29 NOTE — Progress Notes (Signed)
[facility] PROGRESS NOTE  Trevor Webb UJW:[id number] DOB: 1929/01/20 DOA: 03/25/2012 PCP: Darlina Guys, MD  Assessment/Plan: Active Problems:   Hypertension   Paroxysmal atrial fibrillation   Dementia   CKD (chronic kidney disease), stage II   Rectal bleed     Rectal bleed: Awaiting further plan from Dr. Madilyn Fireman  probably diverticular bleed vs mucosal oozing from being on coumadin.  continuetelemetry for closer monitoring.  Post transfusion hemoglobin of 9.1 Holding coumadin.  - IV vitamin K given by ED.  - Hemoglobin has dropped to 7.7 from 10.4 , INR 1.49- clear liquid diet.  GI consultation obtained from Dr Madilyn Fireman. Should the patient be restarted on Coumadin Possible colonoscopy during this admission     2. Atrial fibrillation : rate controlled. Coumadin held for gi bleed.  3. Hypertension; bp meds on hold for borderline bp.  4. Hypernatremia: possibly from dehydration. Fluids.  5. Stage 2 CKD; STABLE. BUN at 21.  6. Anemia: normocytic. Stable    Code Status: DNR confirmed with son.  Family Communication: spoke withson over the phone at [phone]  Disposition Plan: Possibly tomorrow    Brief narrative:  Trevor Webb is a 79 y.o. male with prior h/o diverticulitis, diverticulosis, atrial fibrillation on coumadin, CHF, hypertension, was brought in from SNF for 2 episodes of bright red blood per rectum. His H&H 10.4/ 30.2 is slightly low from baseline of 12. He is hemodynamically stable, but very confused to give any kind of history. Most of the history was obtained from the son and the ED notes. He is being admitted for evaluation of GI bleed.  Consultants:  GI Procedures:  None Antibiotics:  None  HPI/Subjective:  No further bleeding overnight   Objective: Filed Vitals:   03/28/12 0508 03/28/12 1430 03/28/12 1953 03/29/12 0440  BP: 132/65 135/50 146/71 134/65  Pulse: 62 63 56 64  Temp: 98.1 F (36.7 C) 98.3 F (36.8 C) 97.4 F (36.3 C) 98  F (36.7 C)  TempSrc: Oral Oral Oral Oral  Resp: 19 18 19 19   Height:      Weight:      SpO2: 98% 98% 98% 97%    Intake/Output Summary (Last 24 hours) at 03/29/12 0931 Last data filed at 03/28/12 1700  Gross per 24 hour  Intake    480 ml  Output   1251 ml  Net   -771 ml    Exam:  HENT:  Head: Atraumatic.  Nose: Nose normal.  Mouth/Throat: Oropharynx is clear and moist.  Eyes: Conjunctivae are normal. Pupils are equal, round, and reactive to light. No scleral icterus.  Neck: Neck supple. No tracheal deviation present.  Cardiovascular: Normal rate, regular rhythm, normal heart sounds and intact distal pulses.  Pulmonary/Chest: Effort normal and breath sounds normal. No respiratory distress.  Abdominal: Soft. Normal appearance and bowel sounds are normal. She exhibits no distension. There is no tenderness.  Musculoskeletal: She exhibits no edema and no tenderness.  Neurological: She is alert. No cranial nerve deficit.    Data Reviewed: Basic Metabolic Panel:  Recent Labs Lab 03/25/12 1241 03/26/12 0814 03/27/12 0535  NA 147* 144 144  K 3.6 3.5 3.4*  CL 108 108 110  CO2 30 26 26   GLUCOSE 96 126* 88  BUN 21 29* 28*  CREATININE 1.38* 1.34 1.29  CALCIUM 8.6 8.1* 7.9*    Liver Function Tests:  Recent Labs Lab 03/25/12 1241 03/26/12 0814  AST 8 7  ALT 6 <5  ALKPHOS 113 95  BILITOT 0.4 0.5  PROT 6.1 5.2*  ALBUMIN 3.1* 2.7*   No results found for this basename: LIPASE, AMYLASE,  in the last 168 hours No results found for this basename: AMMONIA,  in the last 168 hours  CBC:  Recent Labs Lab 03/25/12 1241 03/25/12 2345 03/26/12 0814 03/27/12 0535 03/28/12 0555 03/29/12 0800  WBC 10.0  --  8.1 8.1 7.6 7.4  NEUTROABS 7.3  --   --   --   --   --   HGB 10.4* 7.6* 7.7* 8.5* 8.5* 9.1*  HCT 30.2* 21.9* 22.5* 24.0* 24.3* 25.9*  MCV 87.3  --  89.3 86.6 86.2 89.0  PLT 192  --  149* 128* 145* 167    Cardiac Enzymes: No results found for this basename:  CKTOTAL, CKMB, CKMBINDEX, TROPONINI,  in the last 168 hours BNP (last 3 results)  Recent Labs  03/25/12 2225  PROBNP 4380.0*     CBG:  Recent Labs Lab 03/26/12 2147 03/27/12 0740 03/27/12 1115 03/27/12 1614 03/28/12 0507  GLUCAP 101* 86 97 96 84    No results found for this or any previous visit (from the past 240 hour(s)).   Studies: No results found.  Scheduled Meds: . azelastine  2 spray Each Nare BID  . carvedilol  6.25 mg Oral BID WC  . donepezil  10 mg Oral QHS  . levETIRAcetam  500 mg Oral BID  . loratadine  10 mg Oral Daily  . pantoprazole (PROTONIX) IV  40 mg Intravenous Q24H  . sertraline  25 mg Oral Daily  . sodium chloride  3 mL Intravenous Q12H  . tamsulosin  0.4 mg Oral QHS   Continuous Infusions:   Active Problems:   Hypertension   Paroxysmal atrial fibrillation   Dementia   CKD (chronic kidney disease), stage II   Rectal bleed    Time spent: 40 minutes   [facility]  [facility] Pager [phone]. If 8PM-8AM, please contact night-coverage at [email or web address], password [email or web address] 03/29/2012, 9:31 AM  LOS: 4 days

## 2012-03-29 NOTE — Progress Notes (Signed)
Pt is okay to leave without PIV site per Dr. Susie Cassette. Trevor Webb

## 2012-03-30 LAB — CBC
HCT: 27.7 % — ABNORMAL LOW (ref 39.0–52.0)
Hemoglobin: 9.7 g/dL — ABNORMAL LOW (ref 13.0–17.0)
MCV: 87.1 fL (ref 78.0–100.0)
RBC: 3.18 MIL/uL — ABNORMAL LOW (ref 4.22–5.81)
RDW: 13.9 % (ref 11.5–15.5)
WBC: 8.1 10*3/uL (ref 4.0–10.5)

## 2012-03-30 LAB — GLUCOSE, CAPILLARY: Glucose-Capillary: 87 mg/dL (ref 70–99)

## 2012-03-30 MED ORDER — PANTOPRAZOLE SODIUM 40 MG PO TBEC
40.0000 mg | DELAYED_RELEASE_TABLET | Freq: Every day | ORAL | Status: DC
  Administered 2012-03-30: 40 mg via ORAL
  Filled 2012-03-30: qty 1

## 2012-03-30 MED ORDER — CARVEDILOL 6.25 MG PO TABS: 3.1250 mg | ORAL_TABLET | Freq: Two times a day (BID) | ORAL | Status: DC

## 2012-03-30 MED ORDER — PANTOPRAZOLE SODIUM 40 MG PO TBEC: 40.0000 mg | DELAYED_RELEASE_TABLET | Freq: Every day | ORAL | Status: DC

## 2012-03-30 NOTE — Clinical Social Work Psychosocial (Signed)
Clinical Social Work Department BRIEF PSYCHOSOCIAL ASSESSMENT 03/30/2012  Patient:  Trevor Webb, Trevor Webb     Account Number:  [id number]     Admit date:  03/25/2012  Clinical Social Worker:  Thomasene Mohair  Date/Time:  03/30/2012 11:30 AM  Referred by:  Physician  Date Referred:  03/29/2012 Referred for  SNF Placement   Other Referral:   From [facility]   Interview type:  Patient Other interview type:   Son    PSYCHOSOCIAL DATA Living Status:  FACILITY Admitted from facility:   Level of care:  Skilled Nursing Facility Primary support name:  Trevor Webb Primary support relationship to patient:  CHILD, ADULT Degree of support available:   strong    CURRENT CONCERNS Current Concerns  Post-Acute Placement   Other Concerns:    SOCIAL WORK ASSESSMENT / PLAN CSW was referred to Pt to assist with dc coordination back to SNF [facility] [place]. Pt is long-term SNF resident, along with his wife, at the facility. CSW is familiar with Pt from previous admissions. [facility] is ready for Pt's return once medicallly cleared. Pt's son will provide tranportation back to SNF.   Assessment/plan status:  No Further Intervention Required Other assessment/ plan:   CSW notified that Pt is cleared to dc to SNF today. CSW sent dc info and created packet for son to take with Pt.   Information/referral to community resources:    PATIENT'S/FAMILY'S RESPONSE TO PLAN OF CARE: Pt states that he is ready to go back to SNF. Pt's son notifiied SNF and stopped by to pick up clothes for Pt's return.   Frederico Hamman, LCSW [phone]

## 2012-03-30 NOTE — Progress Notes (Signed)
[facility] PROGRESS NOTE Subjective no gross bleeding. Patient feels fine.  Objective: Vital signs in last 24 hours: Temp:  [97.4 F (36.3 C)-98.1 F (36.7 C)] 97.4 F (36.3 C) (03/11 0701) Pulse Rate:  [61-80] 62 (03/11 0701) Resp:  [18] 18 (03/11 0701) BP: (129-159)/(59-63) 137/61 mmHg (03/11 0701) SpO2:  [98 %-99 %] 99 % (03/11 0701) Last BM Date: 03/28/12  Intake/Output from previous day: 03/10 0701 - 03/11 0700 In: 720 [P.O.:720] Out: 800 [Urine:800] Intake/Output this shift:      Lab Results:  Recent Labs  03/28/12 0555 03/29/12 0800 03/30/12 0750  WBC 7.6 7.4 8.1  HGB 8.5* 9.1* 9.7*  HCT 24.3* 25.9* 27.7*  PLT 145* 167 189   BMET  Recent Labs  03/29/12 0800  NA 143  K 3.5  CL 110  CO2 23  CREATININE 1.21   LFT No results found for this basename: PROT, AST, ALT, ALKPHOS, BILITOT, BILIDIR, IBILI,  in the last 72 hours PT/INR  Recent Labs  03/28/12 0555  LABPROT 17.6*  INR 1.49   PANCREAS No results found for this basename: LIPASE,  in the last 72 hours       Studies/Results: No results found.  Medications: I have reviewed the patient's current medications.  Assessment/Plan: 1. G.I. bleeding. Probably diverticular appears to have resolved. I think he is safe to be discharged and could probably resume as Coumadin in about 5 days at the regular dose. I would have him follow-up with Dr. Earl Gala to determine if he needs colonoscopy and to make certain that his hemoglobin is okay and adjust as Coumadin. Have discussed with Dr.Abrol   EDWARDS JR,JAMES L 03/30/2012, 9:04 AM

## 2012-03-30 NOTE — Discharge Summary (Addendum)
Physician Discharge Summary  Trevor Webb MRN: [id number] DOB/AGE: [birth year] 77 y.o.  PCP: Darlina Guys, MD   Admit date: 03/25/2012 Discharge date: 03/30/2012  Discharge Diagnoses:      Hypertension   Paroxysmal atrial fibrillation   Dementia   CKD (chronic kidney disease), stage II   Rectal bleed of unclear source     Medication List    STOP taking these medications       warfarin 3 MG tablet  Commonly known as:  COUMADIN      TAKE these medications       aspirin EC 81 MG tablet  Take 81 mg by mouth daily.     azelastine 137 MCG/SPRAY nasal spray  Commonly known as:  ASTELIN  Place 2 sprays into the nose 2 (two) times daily. Use in each nostril as directed     carvedilol 6.25 MG tablet  Commonly known as:  COREG  Take 0.5 tablets (3.125 mg total) by mouth 2 (two) times daily with a meal.     cyanocobalamin 1000 MCG/ML injection  Commonly known as:  (VITAMIN B-12)  Inject 1,000 mcg into the muscle every 30 (thirty) days. Give on the 24th of each month     donepezil 10 MG tablet  Commonly known as:  ARICEPT  Take 10 mg by mouth at bedtime.     furosemide 40 MG tablet  Commonly known as:  LASIX  Take 40 mg by mouth daily.     isosorbide mononitrate 30 MG 24 hr tablet  Commonly known as:  IMDUR  Take 15 mg by mouth daily.     levETIRAcetam 500 MG tablet  Commonly known as:  KEPPRA  Take 500 mg by mouth 2 (two) times daily.     loratadine 10 MG tablet  Commonly known as:  CLARITIN  Take 10 mg by mouth daily.     nitroGLYCERIN 0.4 MG SL tablet  Commonly known as:  NITROSTAT  Place 0.4 mg under the tongue every 5 (five) minutes as needed. For chest pain     pantoprazole 40 MG tablet  Commonly known as:  PROTONIX  Take 1 tablet (40 mg total) by mouth daily.     ramipril 5 MG capsule  Commonly known as:  ALTACE  Take 5 mg by mouth 2 (two) times daily.     sertraline 25 MG tablet  Commonly known as:  ZOLOFT  Take 25 mg by mouth daily.      sodium chloride 0.65 % nasal spray  Commonly known as:  OCEAN  Place 2 sprays into the nose daily as needed. For nasal congestion     tamsulosin 0.4 MG Caps  Commonly known as:  FLOMAX  Take 0.4 mg by mouth at bedtime.        Discharge Condition: Stable  Disposition: 03-Skilled Nursing Facility   Consults: Gastroenterology      Microbiology: No results found for this or any previous visit (from the past 240 hour(s)).   Labs: Results for orders placed during the hospital encounter of 03/25/12 (from the past 48 hour(s))  GLUCOSE, CAPILLARY     Status: None   Collection Time    03/29/12  7:07 AM      Result Value Range   Glucose-Capillary 94  70 - 99 mg/dL  CBC     Status: Abnormal   Collection Time    03/29/12  8:00 AM      Result Value Range   WBC 7.4  4.0 - 10.5 K/uL   RBC 2.91 (*) 4.22 - 5.81 MIL/uL   Hemoglobin 9.1 (*) 13.0 - 17.0 g/dL   HCT 16.1 (*) 09.6 - 04.5 %   MCV 89.0  78.0 - 100.0 fL   MCH 31.3  26.0 - 34.0 pg   MCHC 35.1  30.0 - 36.0 g/dL   RDW 40.9  81.1 - 91.4 %   Platelets 167  150 - 400 K/uL  BASIC METABOLIC PANEL     Status: Abnormal   Collection Time    03/29/12  8:00 AM      Result Value Range   Sodium 143  135 - 145 mEq/L   Potassium 3.5  3.5 - 5.1 mEq/L   Chloride 110  96 - 112 mEq/L   CO2 23  19 - 32 mEq/L   Glucose, Bld 138 (*) 70 - 99 mg/dL   BUN 19  6 - 23 mg/dL   Creatinine, Ser 7.82  0.50 - 1.35 mg/dL   Calcium 8.0 (*) 8.4 - 10.5 mg/dL   GFR calc non Af Amer 54 (*) >90 mL/min   GFR calc Af Amer 63 (*) >90 mL/min   Comment:            The eGFR has been calculated     using the CKD EPI equation.     This calculation has not been     validated in all clinical     situations.     eGFR's persistently     <90 mL/min signify     possible Chronic Kidney Disease.  GLUCOSE, CAPILLARY     Status: None   Collection Time    03/30/12  6:59 AM      Result Value Range   Glucose-Capillary 87  70 - 99 mg/dL     HPI  79 y.o. white  male who presents with a one-day history of painless hematochezia several episodes witnessed at the nursing home. He is not having any hemodynamic instability and has not had a bowel movement since last night I here in the hospital. He has had a history of some sort of diverticular event in the remote past requiring a temporary colostomy and also was apparently treated for a diverticular abscess in the last 2 or 3 years. He is sees Dr. Matthias Hughs   HOSPITAL COURSE:  Rectal bleeding Seen by Dr. Madilyn Fireman in the hospital Patient received vitamin K for reversal of his INR in the ER initial hemoglobin was 7.7 Received 2 units of packed red blood cells Posttransfusion hemoglobin was around 10.4 Coumadin has been discontinued Awaiting further recommendations from GI I would recommend to hold the Coumadin for at least 5 days before resumption after GI followup in 2 weeks    . Atrial fibrillation : rate controlled. Bradycardic, Coreg decreased, Coumadin held for gi bleed.  3. Hypertension; resume blood pressure medications, decrease Coreg because of bradycardia.  4. Hypernatremia: possibly from dehydration. Resolved 5. Stage 2 CKD; STABLE. BUN at 21.  6. Anemia: normocytic. Stable . Repeat CBC weekly   Discharge Exam:   Blood pressure 137/61, pulse 62, temperature 97.4 F (36.3 C), temperature source Oral, resp. rate 18, height 6\' 2"  (1.88 m), weight 84.6 kg (186 lb 8.2 oz), SpO2 99.00%.   Head: Normocephalic, without obvious abnormality, atraumatic  Neck: no adenopathy, no carotid bruit, no JVD, supple, symmetrical, trachea midline and thyroid not enlarged, symmetric, no tenderness/mass/nodules  Resp: clear to auscultation bilaterally  Cardio: regular rate and rhythm, S1, S2 normal,  no murmur, click, rub or gallop  GI: Abdomen soft nondistended with normoactive bowel sounds. No hepatomegaly mass or guarding  Extremities: extremities normal, atraumatic, no cyanosis or  edema       Signed: ABROL,NAYANA 03/30/2012, 8:47 AM

## 2012-03-30 NOTE — Progress Notes (Signed)
Pt discharge instructions and patient education completed. IV site d/c. Site WNL. Pt discharged back to [facility] with son, [name]. Dion Saucier

## 2012-04-16 ENCOUNTER — Emergency Department (HOSPITAL_BASED_OUTPATIENT_CLINIC_OR_DEPARTMENT_OTHER)
Admission: EM | Admit: 2012-04-16 | Discharge: 2012-04-16 | Disposition: A | Discharge status: Skilled Nursing Facility | Payer: Medicare PPO | Attending: Emergency Medicine | Admitting: Emergency Medicine

## 2012-04-16 ENCOUNTER — Encounter (HOSPITAL_BASED_OUTPATIENT_CLINIC_OR_DEPARTMENT_OTHER): Payer: Self-pay

## 2012-04-16 DIAGNOSIS — I69998 Other sequelae following unspecified cerebrovascular disease: Secondary | ICD-10-CM | POA: Insufficient documentation

## 2012-04-16 DIAGNOSIS — I509 Heart failure, unspecified: Secondary | ICD-10-CM | POA: Insufficient documentation

## 2012-04-16 DIAGNOSIS — I252 Old myocardial infarction: Secondary | ICD-10-CM | POA: Insufficient documentation

## 2012-04-16 DIAGNOSIS — F0280 Dementia in other diseases classified elsewhere without behavioral disturbance: Secondary | ICD-10-CM | POA: Insufficient documentation

## 2012-04-16 DIAGNOSIS — D649 Anemia, unspecified: Secondary | ICD-10-CM

## 2012-04-16 DIAGNOSIS — Z7982 Long term (current) use of aspirin: Secondary | ICD-10-CM | POA: Insufficient documentation

## 2012-04-16 DIAGNOSIS — Z8679 Personal history of other diseases of the circulatory system: Secondary | ICD-10-CM | POA: Insufficient documentation

## 2012-04-16 DIAGNOSIS — G309 Alzheimer's disease, unspecified: Secondary | ICD-10-CM | POA: Insufficient documentation

## 2012-04-16 DIAGNOSIS — I10 Essential (primary) hypertension: Secondary | ICD-10-CM | POA: Insufficient documentation

## 2012-04-16 DIAGNOSIS — M129 Arthropathy, unspecified: Secondary | ICD-10-CM | POA: Insufficient documentation

## 2012-04-16 DIAGNOSIS — K219 Gastro-esophageal reflux disease without esophagitis: Secondary | ICD-10-CM | POA: Insufficient documentation

## 2012-04-16 DIAGNOSIS — G40909 Epilepsy, unspecified, not intractable, without status epilepticus: Secondary | ICD-10-CM | POA: Insufficient documentation

## 2012-04-16 DIAGNOSIS — Z8719 Personal history of other diseases of the digestive system: Secondary | ICD-10-CM | POA: Insufficient documentation

## 2012-04-16 DIAGNOSIS — Z79899 Other long term (current) drug therapy: Secondary | ICD-10-CM | POA: Insufficient documentation

## 2012-04-16 DIAGNOSIS — Z951 Presence of aortocoronary bypass graft: Secondary | ICD-10-CM | POA: Insufficient documentation

## 2012-04-16 DIAGNOSIS — M6281 Muscle weakness (generalized): Secondary | ICD-10-CM | POA: Insufficient documentation

## 2012-04-16 LAB — COMPREHENSIVE METABOLIC PANEL
AST: 13 U/L (ref 0–37)
Albumin: 3.4 g/dL — ABNORMAL LOW (ref 3.5–5.2)
Alkaline Phosphatase: 121 U/L — ABNORMAL HIGH (ref 39–117)
BUN: 18 mg/dL (ref 6–23)
CO2: 28 mEq/L (ref 19–32)
Chloride: 105 mEq/L (ref 96–112)
GFR calc non Af Amer: 36 mL/min — ABNORMAL LOW (ref >90)
Potassium: 3.8 mEq/L (ref 3.5–5.1)
Total Bilirubin: 0.2 mg/dL — ABNORMAL LOW (ref 0.3–1.2)

## 2012-04-16 LAB — CBC WITH DIFFERENTIAL/PLATELET
Eosinophils Absolute: 0.2 10*3/uL (ref 0.0–0.7)
Eosinophils Relative: 4 % (ref 0–5)
Hemoglobin: 10 g/dL — ABNORMAL LOW (ref 13.0–17.0)
Lymphs Abs: 1.4 10*3/uL (ref 0.7–4.0)
MCH: 30.1 pg (ref 26.0–34.0)
MCV: 92.5 fL (ref 78.0–100.0)
Monocytes Relative: 9 % (ref 3–12)
RBC: 3.32 MIL/uL — ABNORMAL LOW (ref 4.22–5.81)

## 2012-04-16 LAB — APTT: aPTT: 33 seconds (ref 24–37)

## 2012-04-16 MED ORDER — PANTOPRAZOLE SODIUM 40 MG IV SOLR: 40.0000 mg | Freq: Once | INTRAVENOUS | Status: DC

## 2012-04-16 NOTE — ED Provider Notes (Signed)
History     CSN: [id number] Arrival date & time 04/16/12  1523 First MD Initiated Contact with Patient 04/16/12 1609      Chief Complaint  Patient presents with  . GI Bleeding   Hx by son Level 5 caveat, dementia  HPI Pt was recently discharged from the hospital a few weeks ago for a GI bleed.  He has been doing well recently without complaints of vomiting, pain, blood in his stool or other concerns.  He saw his GI doctor the other day and had lab tests drawn.  They are considering restarting his coumadin. He was told today that his hemoglobin has decreased to 8.  They have been monitoring his stools at the facility and they have been guiac positive but without bright red blood.  Pt was sent to the ED to have his lab tests rechecked. Past Medical History  Diagnosis Date  . Hypertension   . CHF, left ventricular   . Paroxysmal atrial fibrillation   . CHF (congestive heart failure)   . Angina   . Myocardial infarction 2009  . Stroke 2011    residual:  "right sided weakness; problems walking; dementia"  . Dementia     S/P CVA 2011  . Arthritis   . Dysphasia   . A-fib   . GERD (gastroesophageal reflux disease)   . Alzheimer's dementia   . Seizures     epilepsy  . Diverticulitis   . Abdominal abscess     Past Surgical History  Procedure Laterality Date  . Coronary artery bypass graft  2009    CABG X4  . Knee arthroscopy      multiple; bilaterally  . Total knee arthroplasty      bilateral  . Total hip arthroplasty      right  . Joint replacement      bilateral knees; right hip  . Colon surgery    . Colon resection  1980's    "had colostomy bag for awhile; went back in and put it all back together"  . Colostomy takedown  1980's    Family History  Problem Relation Age of Onset  . Stroke Mother   . Heart attack Father     History  Substance Use Topics  . Smoking status: Never Smoker   . Smokeless tobacco: Never Used  . Alcohol Use: No      Review of  Systems  All other systems reviewed and are negative.    Allergies  Morphine and related  Home Medications   Current Outpatient Rx  Name  Route  Sig  Dispense  Refill  . polyethylene glycol (MIRALAX / GLYCOLAX) packet   Oral   Take 17 g by mouth daily.         Marland Kitchen aspirin EC 81 MG tablet   Oral   Take 81 mg by mouth daily.         Marland Kitchen azelastine (ASTELIN) 137 MCG/SPRAY nasal spray   Nasal   Place 2 sprays into the nose 2 (two) times daily. Use in each nostril as directed         . carvedilol (COREG) 6.25 MG tablet   Oral   Take 0.5 tablets (3.125 mg total) by mouth 2 (two) times daily with a meal.   60 tablet   2   . cyanocobalamin (,VITAMIN B-12,) 1000 MCG/ML injection   Intramuscular   Inject 1,000 mcg into the muscle every 30 (thirty) days. Give on the 24th of each month         .  donepezil (ARICEPT) 10 MG tablet   Oral   Take 10 mg by mouth at bedtime.         . furosemide (LASIX) 40 MG tablet   Oral   Take 40 mg by mouth daily.         . isosorbide mononitrate (IMDUR) 30 MG 24 hr tablet   Oral   Take 15 mg by mouth daily.         Marland Kitchen levETIRAcetam (KEPPRA) 500 MG tablet   Oral   Take 500 mg by mouth 2 (two) times daily.         Marland Kitchen loratadine (CLARITIN) 10 MG tablet   Oral   Take 10 mg by mouth daily.         . nitroGLYCERIN (NITROSTAT) 0.4 MG SL tablet   Sublingual   Place 0.4 mg under the tongue every 5 (five) minutes as needed. For chest pain         . pantoprazole (PROTONIX) 40 MG tablet   Oral   Take 1 tablet (40 mg total) by mouth daily.   30 tablet   0   . ramipril (ALTACE) 5 MG capsule   Oral   Take 5 mg by mouth 2 (two) times daily.         . sertraline (ZOLOFT) 25 MG tablet   Oral   Take 25 mg by mouth daily.         . sodium chloride (OCEAN) 0.65 % nasal spray   Nasal   Place 2 sprays into the nose daily as needed. For nasal congestion         . Tamsulosin HCl (FLOMAX) 0.4 MG CAPS   Oral   Take 0.4 mg by  mouth at bedtime.           BP 132/93  Pulse 63  Temp(Src) 97.9 F (36.6 C) (Oral)  Resp 16  SpO2 100%  Physical Exam  Nursing note and vitals reviewed. Constitutional: He appears well-developed and well-nourished. No distress.  HENT:  Head: Normocephalic and atraumatic.  Right Ear: External ear normal.  Left Ear: External ear normal.  Eyes: Conjunctivae are normal. Right eye exhibits no discharge. Left eye exhibits no discharge. No scleral icterus.  Neck: Neck supple. No tracheal deviation present.  Cardiovascular: Normal rate, regular rhythm and intact distal pulses.   Pulmonary/Chest: Effort normal and breath sounds normal. No stridor. No respiratory distress. He has no wheezes. He has no rales.  Abdominal: Soft. Bowel sounds are normal. He exhibits no distension. There is no tenderness. There is no rebound and no guarding.  Musculoskeletal: He exhibits no edema and no tenderness.  Neurological: He is alert. He has normal strength. No sensory deficit. Cranial nerve deficit:  no gross defecits noted. He exhibits normal muscle tone. He displays no seizure activity. Coordination normal.  Skin: Skin is warm and dry. No rash noted.  Psychiatric: He has a normal mood and affect.    ED Course  Procedures (including critical care time)  Labs Reviewed  COMPREHENSIVE METABOLIC PANEL - Abnormal; Notable for the following:    Glucose, Bld 103 (*)    Creatinine, Ser 1.70 (*)    Albumin 3.4 (*)    Alkaline Phosphatase 121 (*)    Total Bilirubin 0.2 (*)    GFR calc non Af Amer 36 (*)    GFR calc Af Amer 41 (*)    All other components within normal limits  CBC WITH DIFFERENTIAL - Abnormal; Notable for the  following:    RBC 3.32 (*)    Hemoglobin 10.0 (*)    HCT 30.7 (*)    All other components within normal limits  APTT  PROTIME-INR   No results found.   1. Anemia       MDM  Anemia Patient's hemoglobin seems to be improving. He was in the hospital for a GI bleed.  Apparently at the facility he  had a guaiac positive stool however there is no evidence of any bright red active bleeding. I discussed the findings with the patient and his son. They would prefer to manage this as an outpatient if possible. Considering that he doesn't appear to be having any active bleeding his hemoglobin is actually higher than it was upon discharge i, it is reasonable to continue to follow this up closely as an outpatient.        Celene Kras, MD 04/16/12 [id number]

## 2012-04-16 NOTE — ED Notes (Signed)
Son states that pt has recently been treated for rectal bleeding, labs were looking good, and he was restarted on coumadin recently.  Pt was seen by GI again today and was told that his Hgb had dropped to 8 and he has hemoccult positive stools.  Pt sent for CBC, CMP, and rectal exam.  Pt is alert, confused, but at baseline per son, and vss.

## 2012-08-18 ENCOUNTER — Emergency Department (HOSPITAL_COMMUNITY): Payer: Medicare PPO

## 2012-08-18 ENCOUNTER — Encounter (HOSPITAL_COMMUNITY): Payer: Self-pay | Admitting: Emergency Medicine

## 2012-08-18 ENCOUNTER — Inpatient Hospital Stay (HOSPITAL_COMMUNITY)
Admission: EM | Admit: 2012-08-18 | Discharge: 2012-08-27 | DRG: 378 | Disposition: A | Discharge status: Home / Self Care | Payer: Medicare PPO | Attending: Family Medicine | Admitting: Family Medicine

## 2012-08-18 DIAGNOSIS — Z7982 Long term (current) use of aspirin: Secondary | ICD-10-CM

## 2012-08-18 DIAGNOSIS — Z96649 Presence of unspecified artificial hip joint: Secondary | ICD-10-CM

## 2012-08-18 DIAGNOSIS — K625 Hemorrhage of anus and rectum: Secondary | ICD-10-CM

## 2012-08-18 DIAGNOSIS — E876 Hypokalemia: Secondary | ICD-10-CM

## 2012-08-18 DIAGNOSIS — Z8673 Personal history of transient ischemic attack (TIA), and cerebral infarction without residual deficits: Secondary | ICD-10-CM

## 2012-08-18 DIAGNOSIS — R5381 Other malaise: Secondary | ICD-10-CM | POA: Diagnosis present

## 2012-08-18 DIAGNOSIS — D62 Acute posthemorrhagic anemia: Secondary | ICD-10-CM | POA: Diagnosis present

## 2012-08-18 DIAGNOSIS — K219 Gastro-esophageal reflux disease without esophagitis: Secondary | ICD-10-CM | POA: Diagnosis present

## 2012-08-18 DIAGNOSIS — I639 Cerebral infarction, unspecified: Secondary | ICD-10-CM

## 2012-08-18 DIAGNOSIS — K922 Gastrointestinal hemorrhage, unspecified: Secondary | ICD-10-CM

## 2012-08-18 DIAGNOSIS — F0390 Unspecified dementia without behavioral disturbance: Secondary | ICD-10-CM | POA: Diagnosis present

## 2012-08-18 DIAGNOSIS — G40909 Epilepsy, unspecified, not intractable, without status epilepticus: Secondary | ICD-10-CM | POA: Diagnosis present

## 2012-08-18 DIAGNOSIS — I498 Other specified cardiac arrhythmias: Secondary | ICD-10-CM | POA: Diagnosis present

## 2012-08-18 DIAGNOSIS — N400 Enlarged prostate without lower urinary tract symptoms: Secondary | ICD-10-CM | POA: Diagnosis present

## 2012-08-18 DIAGNOSIS — I129 Hypertensive chronic kidney disease with stage 1 through stage 4 chronic kidney disease, or unspecified chronic kidney disease: Secondary | ICD-10-CM | POA: Diagnosis present

## 2012-08-18 DIAGNOSIS — I509 Heart failure, unspecified: Secondary | ICD-10-CM | POA: Diagnosis present

## 2012-08-18 DIAGNOSIS — I252 Old myocardial infarction: Secondary | ICD-10-CM

## 2012-08-18 DIAGNOSIS — K648 Other hemorrhoids: Secondary | ICD-10-CM | POA: Diagnosis present

## 2012-08-18 DIAGNOSIS — R58 Hemorrhage, not elsewhere classified: Secondary | ICD-10-CM | POA: Diagnosis present

## 2012-08-18 DIAGNOSIS — Z96659 Presence of unspecified artificial knee joint: Secondary | ICD-10-CM

## 2012-08-18 DIAGNOSIS — K5792 Diverticulitis of intestine, part unspecified, without perforation or abscess without bleeding: Secondary | ICD-10-CM

## 2012-08-18 DIAGNOSIS — F0280 Dementia in other diseases classified elsewhere without behavioral disturbance: Secondary | ICD-10-CM | POA: Diagnosis present

## 2012-08-18 DIAGNOSIS — B029 Zoster without complications: Secondary | ICD-10-CM | POA: Diagnosis present

## 2012-08-18 DIAGNOSIS — I4891 Unspecified atrial fibrillation: Secondary | ICD-10-CM | POA: Diagnosis present

## 2012-08-18 DIAGNOSIS — I10 Essential (primary) hypertension: Secondary | ICD-10-CM | POA: Diagnosis present

## 2012-08-18 DIAGNOSIS — Z66 Do not resuscitate: Secondary | ICD-10-CM | POA: Diagnosis present

## 2012-08-18 DIAGNOSIS — R5383 Other fatigue: Secondary | ICD-10-CM | POA: Diagnosis present

## 2012-08-18 DIAGNOSIS — K5710 Diverticulosis of small intestine without perforation or abscess without bleeding: Secondary | ICD-10-CM | POA: Diagnosis present

## 2012-08-18 DIAGNOSIS — I5032 Chronic diastolic (congestive) heart failure: Secondary | ICD-10-CM | POA: Diagnosis present

## 2012-08-18 DIAGNOSIS — N182 Chronic kidney disease, stage 2 (mild): Secondary | ICD-10-CM | POA: Diagnosis present

## 2012-08-18 DIAGNOSIS — I2510 Atherosclerotic heart disease of native coronary artery without angina pectoris: Secondary | ICD-10-CM | POA: Diagnosis present

## 2012-08-18 DIAGNOSIS — Z951 Presence of aortocoronary bypass graft: Secondary | ICD-10-CM

## 2012-08-18 DIAGNOSIS — K5733 Diverticulitis of large intestine without perforation or abscess with bleeding: Principal | ICD-10-CM | POA: Diagnosis present

## 2012-08-18 DIAGNOSIS — Z7901 Long term (current) use of anticoagulants: Secondary | ICD-10-CM

## 2012-08-18 DIAGNOSIS — G309 Alzheimer's disease, unspecified: Secondary | ICD-10-CM | POA: Diagnosis present

## 2012-08-18 DIAGNOSIS — D649 Anemia, unspecified: Secondary | ICD-10-CM

## 2012-08-18 DIAGNOSIS — N179 Acute kidney failure, unspecified: Secondary | ICD-10-CM | POA: Diagnosis present

## 2012-08-18 DIAGNOSIS — I219 Acute myocardial infarction, unspecified: Secondary | ICD-10-CM

## 2012-08-18 DIAGNOSIS — N390 Urinary tract infection, site not specified: Secondary | ICD-10-CM | POA: Diagnosis present

## 2012-08-18 DIAGNOSIS — I480 Paroxysmal atrial fibrillation: Secondary | ICD-10-CM

## 2012-08-18 DIAGNOSIS — M1990 Unspecified osteoarthritis, unspecified site: Secondary | ICD-10-CM

## 2012-08-18 LAB — COMPREHENSIVE METABOLIC PANEL
AST: 8 U/L (ref 0–37)
CO2: 21 mEq/L (ref 19–32)
Calcium: 8.4 mg/dL (ref 8.4–10.5)
Creatinine, Ser: 1.76 mg/dL — ABNORMAL HIGH (ref 0.50–1.35)
GFR calc non Af Amer: 34 mL/min — ABNORMAL LOW (ref >90)

## 2012-08-18 LAB — URINALYSIS, ROUTINE W REFLEX MICROSCOPIC
Glucose, UA: NEGATIVE mg/dL
Leukocytes, UA: NEGATIVE
Nitrite: NEGATIVE
Protein, ur: NEGATIVE mg/dL
Urobilinogen, UA: 0.2 mg/dL (ref 0.0–1.0)

## 2012-08-18 LAB — CBC WITH DIFFERENTIAL/PLATELET
Basophils Absolute: 0.1 10*3/uL (ref 0.0–0.1)
Eosinophils Relative: 2 % (ref 0–5)
Lymphs Abs: 1.4 10*3/uL (ref 0.7–4.0)
MCH: 26.8 pg (ref 26.0–34.0)
MCHC: 32 g/dL (ref 30.0–36.0)
MCV: 83.9 fL (ref 78.0–100.0)
Monocytes Absolute: 0.9 10*3/uL (ref 0.1–1.0)
Platelets: UNDETERMINED 10*3/uL (ref 150–400)
RDW: 15 % (ref 11.5–15.5)

## 2012-08-18 LAB — POCT I-STAT TROPONIN I

## 2012-08-18 LAB — OCCULT BLOOD, POC DEVICE: Fecal Occult Bld: POSITIVE — AB

## 2012-08-18 LAB — PROTIME-INR: Prothrombin Time: 30.6 seconds — ABNORMAL HIGH (ref 11.6–15.2)

## 2012-08-18 MED ORDER — ISOSORBIDE MONONITRATE 15 MG HALF TABLET
15.0000 mg | ORAL_TABLET | Freq: Every day | ORAL | Status: DC
  Administered 2012-08-18 – 2012-08-27 (×10): 15 mg via ORAL
  Filled 2012-08-18 (×11): qty 1

## 2012-08-18 MED ORDER — ONDANSETRON HCL 4 MG PO TABS: 4.0000 mg | ORAL_TABLET | Freq: Four times a day (QID) | ORAL | Status: DC | PRN

## 2012-08-18 MED ORDER — ONDANSETRON HCL 4 MG/2ML IJ SOLN: 4.0000 mg | Freq: Four times a day (QID) | INTRAMUSCULAR | Status: DC | PRN

## 2012-08-18 MED ORDER — SODIUM CHLORIDE 0.9 % IV SOLN
Freq: Once | INTRAVENOUS | Status: AC
  Administered 2012-08-18: 15:00:00 via INTRAVENOUS

## 2012-08-18 MED ORDER — RAMIPRIL 5 MG PO CAPS
5.0000 mg | ORAL_CAPSULE | Freq: Two times a day (BID) | ORAL | Status: DC
  Administered 2012-08-18 – 2012-08-24 (×11): 5 mg via ORAL
  Filled 2012-08-18 (×18): qty 1

## 2012-08-18 MED ORDER — NITROGLYCERIN 0.4 MG SL SUBL: 0.4000 mg | SUBLINGUAL_TABLET | SUBLINGUAL | Status: DC | PRN

## 2012-08-18 MED ORDER — CARVEDILOL 3.125 MG PO TABS
3.1250 mg | ORAL_TABLET | Freq: Two times a day (BID) | ORAL | Status: DC
  Administered 2012-08-19 – 2012-08-20 (×3): 3.125 mg via ORAL
  Filled 2012-08-18 (×5): qty 1

## 2012-08-18 MED ORDER — SERTRALINE HCL 25 MG PO TABS
25.0000 mg | ORAL_TABLET | Freq: Every day | ORAL | Status: DC
  Administered 2012-08-18 – 2012-08-27 (×10): 25 mg via ORAL
  Filled 2012-08-18 (×11): qty 1

## 2012-08-18 MED ORDER — ONDANSETRON HCL 4 MG/2ML IJ SOLN: 4.0000 mg | Freq: Three times a day (TID) | INTRAMUSCULAR | Status: DC | PRN

## 2012-08-18 MED ORDER — LEVETIRACETAM 500 MG PO TABS
500.0000 mg | ORAL_TABLET | Freq: Two times a day (BID) | ORAL | Status: DC
  Administered 2012-08-18 – 2012-08-27 (×18): 500 mg via ORAL
  Filled 2012-08-18 (×22): qty 1

## 2012-08-18 MED ORDER — SODIUM CHLORIDE 0.9 % IJ SOLN
3.0000 mL | Freq: Two times a day (BID) | INTRAMUSCULAR | Status: DC
  Administered 2012-08-18 – 2012-08-23 (×9): 3 mL via INTRAVENOUS

## 2012-08-18 MED ORDER — SODIUM CHLORIDE 0.9 % IV SOLN
INTRAVENOUS | Status: DC
  Administered 2012-08-19: via INTRAVENOUS

## 2012-08-18 MED ORDER — CHLORHEXIDINE GLUCONATE 0.12 % MT SOLN
15.0000 mL | Freq: Two times a day (BID) | OROMUCOSAL | Status: DC
  Administered 2012-08-19 – 2012-08-27 (×18): 15 mL via OROMUCOSAL
  Filled 2012-08-18 (×22): qty 15

## 2012-08-18 MED ORDER — BIOTENE DRY MOUTH MT LIQD
15.0000 mL | Freq: Two times a day (BID) | OROMUCOSAL | Status: DC
  Administered 2012-08-19 – 2012-08-26 (×13): 15 mL via OROMUCOSAL

## 2012-08-18 MED ORDER — PANTOPRAZOLE SODIUM 40 MG IV SOLR
40.0000 mg | Freq: Two times a day (BID) | INTRAVENOUS | Status: DC
  Administered 2012-08-18 – 2012-08-21 (×7): 40 mg via INTRAVENOUS
  Filled 2012-08-18 (×9): qty 40

## 2012-08-18 MED ORDER — CYANOCOBALAMIN 1000 MCG/ML IJ SOLN
1000.0000 ug | INTRAMUSCULAR | Status: DC
  Administered 2012-08-18: 1000 ug via INTRAMUSCULAR
  Filled 2012-08-18: qty 1

## 2012-08-18 MED ORDER — AZELASTINE HCL 0.1 % NA SOLN
2.0000 | Freq: Two times a day (BID) | NASAL | Status: DC
  Administered 2012-08-18 – 2012-08-27 (×16): 2 via NASAL
  Filled 2012-08-18: qty 30

## 2012-08-18 MED ORDER — VITAMIN K1 10 MG/ML IJ SOLN
5.0000 mg | Freq: Once | INTRAMUSCULAR | Status: AC
  Administered 2012-08-18: 5 mg via SUBCUTANEOUS
  Filled 2012-08-18: qty 0.5

## 2012-08-18 MED ORDER — TAMSULOSIN HCL 0.4 MG PO CAPS
0.4000 mg | ORAL_CAPSULE | Freq: Every day | ORAL | Status: DC
  Administered 2012-08-19 – 2012-08-27 (×9): 0.4 mg via ORAL
  Filled 2012-08-18 (×12): qty 1

## 2012-08-18 MED ORDER — DONEPEZIL HCL 10 MG PO TABS
10.0000 mg | ORAL_TABLET | Freq: Every day | ORAL | Status: DC
  Administered 2012-08-18 – 2012-08-26 (×9): 10 mg via ORAL
  Filled 2012-08-18 (×11): qty 1

## 2012-08-18 MED ORDER — LORATADINE 10 MG PO TABS
10.0000 mg | ORAL_TABLET | Freq: Every day | ORAL | Status: DC
  Administered 2012-08-18 – 2012-08-27 (×10): 10 mg via ORAL
  Filled 2012-08-18 (×11): qty 1

## 2012-08-18 MED ORDER — FUROSEMIDE 40 MG PO TABS
40.0000 mg | ORAL_TABLET | Freq: Every day | ORAL | Status: DC
  Administered 2012-08-18 – 2012-08-23 (×6): 40 mg via ORAL
  Filled 2012-08-18 (×8): qty 1

## 2012-08-18 NOTE — ED Notes (Signed)
Did not have to in and out cath pt pt was able to urinate in a urinal.

## 2012-08-18 NOTE — ED Notes (Signed)
Patient transported to X-ray 

## 2012-08-18 NOTE — ED Notes (Signed)
Family at bedside. 

## 2012-08-18 NOTE — ED Provider Notes (Signed)
I saw and evaluated the patient, reviewed the resident's note and I agree with the findings and plan.  79 yo male with generalized fatigue and weakness.  Hx of diverticulosis.  Now with Hg of 5.5.  Well appearing, no NAD, HDS.  Plan blood transfusion and admission.   Candyce Churn, MD 08/20/12 1115

## 2012-08-18 NOTE — ED Notes (Signed)
Consent for blood products signed at this time.

## 2012-08-18 NOTE — ED Provider Notes (Signed)
CSN: [id number]     Arrival date & time 08/18/12  1400 History     First MD Initiated Contact with Patient 08/18/12 1459     Chief Complaint  Patient presents with  . Nausea   (Consider location/radiation/quality/duration/timing/severity/associated sxs/prior Treatment) HPI: TRITON HEIDRICH 79 y.o. with a history of Alzheimer's, coronary artery disease, atrial fibrillation and hypertension presents emergency Department with his son from a assisted-living facility for generalized weakness and nausea and vomiting, as described below. Patient [place] son noticed him to be generally more fatigued than usual. The patient's son visited the patient last night and he was noted to be in bed sleeping in an unusual time. Patient had one episode of vomiting today.  Patient is a 79 y.o. male presenting with vomiting. The history is provided by the patient and a relative.  Emesis Severity:  Moderate Duration:  1 day Timing:  Rare Number of daily episodes:  1 Quality:  Stomach contents Able to tolerate:  Liquids and solids Onset of vomiting after eating: uknown. Progression:  Improving Chronicity:  New Recent urination:  Normal Relieved by:  Nothing Worsened by:  Nothing tried Ineffective treatments:  None tried Associated symptoms: abdominal pain   Associated symptoms: no chills, no cough, no diarrhea, no fever, no headaches, no myalgias, no sore throat and no URI   Abdominal pain:    Location:  Suprapubic   Quality:  Unable to specify   Severity:  Unable to specify   Onset quality:  Unable to specify   Timing:  Unable to specify   Progression:  Unable to specify   Past Medical History  Diagnosis Date  . Hypertension   . CHF, left ventricular   . Paroxysmal atrial fibrillation   . CHF (congestive heart failure)   . Angina   . Myocardial infarction 2009  . Stroke 2011    residual:  "right sided weakness; problems walking; dementia"   . Dementia     S/P CVA 2011  . Arthritis   . Dysphasia   . A-fib   . GERD (gastroesophageal reflux disease)   . Alzheimer's dementia   . Seizures     epilepsy  . Diverticulitis   . Abdominal abscess    Past Surgical History  Procedure Laterality Date  . Coronary artery bypass graft  2009    CABG X4  . Knee arthroscopy      multiple; bilaterally  . Total knee arthroplasty      bilateral  . Total hip arthroplasty      right  . Joint replacement      bilateral knees; right hip  . Colon surgery    . Colon resection  1980's    "had colostomy bag for awhile; went back in and put it all back together"  . Colostomy takedown  1980's   Family History  Problem Relation Age of Onset  . Stroke Mother   . Heart attack Father    History  Substance Use Topics  . Smoking status: Never Smoker   . Smokeless tobacco: Never Used  . Alcohol Use: No    Review of Systems  Constitutional: Negative for fever, chills, diaphoresis, activity change, appetite change and fatigue.  HENT: Negative for congestion, sore throat, rhinorrhea, sneezing and trouble swallowing.   Eyes: Negative for pain and redness.  Respiratory: Negative for cough, choking, chest tightness, shortness of breath, wheezing and stridor.   Cardiovascular: Negative for chest pain  and leg swelling.  Gastrointestinal: Positive for nausea, vomiting and abdominal pain. Negative for diarrhea, constipation, blood in stool, abdominal distention and anal bleeding.  Musculoskeletal: Negative for myalgias and back pain.  Skin: Negative for rash.  Neurological: Positive for weakness (Generalized). Negative for dizziness, speech difficulty, light-headedness, numbness and headaches.  Hematological: Negative for adenopathy.  Psychiatric/Behavioral: Negative for confusion.    Allergies  Morphine and related  Home Medications   No current outpatient prescriptions on file. BP 120/68  Pulse 66  Temp(Src) 98.4 F (36.9 C) (Oral)   Resp 16  SpO2 100% Physical Exam  Nursing note and vitals reviewed. Constitutional: He appears well-developed and well-nourished. No distress.  HENT:  Head: Normocephalic and atraumatic.  Eyes: Conjunctivae and EOM are normal. Right eye exhibits no discharge. Left eye exhibits no discharge.  Neck: Normal range of motion. Neck supple. No tracheal deviation present.  Cardiovascular: Normal rate, regular rhythm and normal heart sounds.  Exam reveals no friction rub.   No murmur heard. Pulmonary/Chest: Effort normal and breath sounds normal. No stridor. No respiratory distress. He has no wheezes. He has no rales. He exhibits no tenderness.  Abdominal: Soft. He exhibits no distension. There is tenderness in the suprapubic area. There is no rebound and no guarding.  Neurological: He is alert. GCS eye subscore is 4. GCS verbal subscore is 5. GCS motor subscore is 6.  Oriented to self and president  Skin: Skin is warm.  Psychiatric: He has a normal mood and affect.    ED Course   Procedures (including critical care time)  Labs Reviewed  CBC WITH DIFFERENTIAL - Abnormal; Notable for the following:    RBC 2.05 (*)    Hemoglobin 5.5 (*)    HCT 17.2 (*)    All other components within normal limits  COMPREHENSIVE METABOLIC PANEL - Abnormal; Notable for the following:    Glucose, Bld 106 (*)    BUN 46 (*)    Creatinine, Ser 1.76 (*)    Albumin 3.0 (*)    GFR calc non Af Amer 34 (*)    GFR calc Af Amer 40 (*)    All other components within normal limits  PROTIME-INR - Abnormal; Notable for the following:    Prothrombin Time 30.6 (*)    INR 3.07 (*)    All other components within normal limits  OCCULT BLOOD, POC DEVICE - Abnormal; Notable for the following:    Fecal Occult Bld POSITIVE (*)    All other components within normal limits  MRSA PCR SCREENING  MRSA PCR SCREENING  LIPASE, BLOOD  URINALYSIS, ROUTINE W REFLEX MICROSCOPIC  CBC  CBC  PROTIME-INR  CBC  CBC  POCT I-STAT  TROPONIN I  TYPE AND SCREEN  PREPARE RBC (CROSSMATCH)  PREPARE RBC (CROSSMATCH)    Date: 08/18/2012  Rate: 64  Rhythm: atrial fibrillation and premature ventricular contractions (PVC)  QRS Axis: left  Intervals: PR indeterminate, QTc 513  ST/T Wave abnormalities: nonspecific ST/T changes  Conduction Disutrbances:nonspecific intraventricular conduction delay  Narrative Interpretation: atrial fibrillation with PVCs  Old EKG Reviewed: similar to previous ECG  Dg Chest 2 View  08/18/2012   *RADIOLOGY REPORT*  Clinical Data: Nausea  CHEST - 2 VIEW  Comparison: March 18, 2011.  Findings: Status post coronary artery bypass graft.  Stable cardiomegaly.  Right lung is clear.  Interval development small linear density in lingular region most consistent with subsegmental atelectasis.  No pleural effusion or pneumothorax is noted.  IMPRESSION: Minimal lingular subsegmental  atelectasis.  Postoperative changes as described above.   Original Report Authenticated By: Lupita Raider.,  M.D.   1. GI bleed   2. Anemia   3. Chronic anticoagulation   4. Small bowel diverticular disease     MDM  Breslin Hemann Cheyney 79 y.o. presents for generalized weakness, fatigue, nausea and vomiting. He has a history of atrial fibrillation is currently on warfarin. AFVSS. Hemodynamically stable at bedside with HR 70's, and BP 110's/60's. O2 saturation 100% on room air. Alert and oriented to self and president. No resp distress. Atrial fibrillation on tele. Suprapubic TTP, but no si/sx of peritonitis. Lipase, CMP, and CBC drawn relfexively per protocol. Will check trop for generalized weakness and fatigue as patient as history of CAD and is elderly. Will check for infections (UTI/PNA).   UA negative for UTI. Chest x-ray negative for pneumonia or pneumothorax. EKG similar to previous. Hemoglobin 5.5. Hemoccult positive. Patient typed and screened. He was transfused 2 units in the emergency department. Admission indicated for  acute GI bleed causing symptomatic anemia. Patient was admitted to the hospitalist, and GI was paged.  Labs, imaging, and ECG reviewed. I discussed this patient's care with my attending, Dr. Loretha Stapler.   Sena Hitch, MD 08/19/12 [phone]

## 2012-08-18 NOTE — ED Notes (Signed)
Son stated, He has not been normal today, he went to bed last night at 1715 which is unusual, he usually goes to bed at 1830 and said he was cold.  Seem a little confused.  Pt. Stated, I just don't feel good and sick on my stomach.  Pt. Alert oriented to president, not year or the day. = hand grips, symmetrical smile, no arm drift.

## 2012-08-18 NOTE — H&P (Addendum)
[facility] History and Physical  Trevor Webb WUJ:[id number] DOB: 08/01/1928 DOA: 08/18/2012  Referring physician: ED PCP: Darlina Guys, MD   Chief Complaint:  Weakness with vomiting x 1 day  HPI:  79 year old male with  history of CAD s/p CABG, hx of CHF, CVA, diverticular bleed ( was admitted in February with lower GI bleed presumed secondary to diverticular bleed while on Coumadin, no intervention done at that time. At that time GI had plan for outpatient colonoscopy), mild to moderate dementia, chronic kidney disease stage II was brought in by his son when he noticed patient to be very  Weak than usual  yesterday. Patient denied any symptoms. Motor port or fever, chills, headache, blurred vision, dizziness, chest pain, palpitations, shortness of breath, abdominal pain, bowel or urinary symptoms. On questioning patient denies any dark stools or blood in stool. Given his dementia he is a poor historian. As per son patient had an episode of vomiting of food early this morning. Patient was brought to the ED. His vitals were stable but noted for a hemoglobin of 5.5. On his previous discharge in February his hemoglobin was 10. Patient also noted to have a creatinine of 1.76. An INR of 2.07. For occult blood was positive in the ED. Patient ordered for 2 units packed cells. Prior hospitalists called in for admission to telemetry. [facility] to be consulted by ED. At baseline patient lives in an assisted-living and is mostly capable with his  ADLs and ambulates with the help of walker.  Review of Systems:  As outlined in the history of present illness. Review of systems Limited due to patient's dementia.   Past Medical History  Diagnosis Date  . Hypertension   . CHF, left ventricular   . Paroxysmal atrial fibrillation   . CHF (congestive heart failure)   . Angina   . Myocardial infarction 2009  . Stroke 2011    residual:  "right sided weakness; problems walking; dementia"  .  Dementia     S/P CVA 2011  . Arthritis   . Dysphasia   . A-fib   . GERD (gastroesophageal reflux disease)   . Alzheimer's dementia   . Seizures     epilepsy  . Diverticulitis   . Abdominal abscess    Past Surgical History  Procedure Laterality Date  . Coronary artery bypass graft  2009    CABG X4  . Knee arthroscopy      multiple; bilaterally  . Total knee arthroplasty      bilateral  . Total hip arthroplasty      right  . Joint replacement      bilateral knees; right hip  . Colon surgery    . Colon resection  1980's    "had colostomy bag for awhile; went back in and put it all back together"  . Colostomy takedown  1980's   Social History:  reports that he has never smoked. He has never used smokeless tobacco. He reports that he does not drink alcohol or use illicit drugs.  Allergies  Allergen Reactions  . Morphine And Related     Delusions    Family History  Problem Relation Age of Onset  . Stroke Mother   . Heart attack Father     Prior to Admission medications   Medication Sig Start Date End Date Taking? Authorizing Provider  aspirin EC 81 MG tablet Take 81 mg by mouth daily.   Yes Historical Provider, MD  azelastine (ASTELIN) 137 MCG/SPRAY  nasal spray Place 2 sprays into the nose 2 (two) times daily.    Yes Historical Provider, MD  carvedilol (COREG) 3.125 MG tablet Take 3.125 mg by mouth 2 (two) times daily with a meal.   Yes Historical Provider, MD  cyanocobalamin (,VITAMIN B-12,) 1000 MCG/ML injection Inject 1,000 mcg into the muscle every 30 (thirty) days. Give on the 24th of each month   Yes Historical Provider, MD  donepezil (ARICEPT) 10 MG tablet Take 10 mg by mouth at bedtime.   Yes Historical Provider, MD  furosemide (LASIX) 40 MG tablet Take 40 mg by mouth daily.   Yes Historical Provider, MD  isosorbide mononitrate (IMDUR) 30 MG 24 hr tablet Take 15 mg by mouth daily.   Yes Historical Provider, MD  levETIRAcetam (KEPPRA) 500 MG tablet Take 500 mg by  mouth 2 (two) times daily.   Yes Historical Provider, MD  loratadine (CLARITIN) 10 MG tablet Take 10 mg by mouth daily.   Yes Historical Provider, MD  nitroGLYCERIN (NITROSTAT) 0.4 MG SL tablet Place 0.4 mg under the tongue every 5 (five) minutes as needed for chest pain.    Yes Historical Provider, MD  pantoprazole (PROTONIX) 40 MG tablet Take 1 tablet (40 mg total) by mouth daily. 03/30/12  Yes Richarda Overlie, MD  polyethylene glycol (MIRALAX / GLYCOLAX) packet Take 17 g by mouth daily.   Yes Historical Provider, MD  ramipril (ALTACE) 5 MG capsule Take 5 mg by mouth 2 (two) times daily.   Yes Historical Provider, MD  sertraline (ZOLOFT) 25 MG tablet Take 25 mg by mouth daily.   Yes Historical Provider, MD  Tamsulosin HCl (FLOMAX) 0.4 MG CAPS Take 0.4 mg by mouth daily after breakfast.    Yes Historical Provider, MD  warfarin (COUMADIN) 1 MG tablet Take 1.5 mg by mouth every Monday. Along with a 3 mg tab to total 4.5 mg   Yes Historical Provider, MD  warfarin (COUMADIN) 3 MG tablet Take 3 mg by mouth daily.   Yes Historical Provider, MD    Physical Exam:  Filed Vitals:   08/18/12 1653 08/18/12 1735 08/18/12 1800 08/18/12 1805  BP: 137/45 138/54  133/65  Pulse: 79 90    Temp: 97.9 F (36.6 C) 98.5 F (36.9 C) 99.1 F (37.3 C)   TempSrc: Oral   Oral  Resp: 18 21  18   SpO2: 100% 97%  100%    Constitutional: Vital signs reviewed. Elderly male lying in bed in no acute distress HEENT: No pallor, moist oral mucosa Chest: Clear to auscultation bilaterally, no added sounds CVS: S1 and S2 he did, no murmurs rub or gallop Abdomen: Prior laparotomy scar, nondistended, nontender, bowel sounds present, wet findings for EGD Extremities: Warm, no edema CNS: AAO x2, non focal     Labs on Admission:  Basic Metabolic Panel:  Recent Labs Lab 08/18/12 1527  NA 137  K 4.3  CL 102  CO2 21  GLUCOSE 106*  BUN 46*  CREATININE 1.76*  CALCIUM 8.4   Liver Function Tests:  Recent Labs Lab  08/18/12 1527  AST 8  ALT 7  ALKPHOS 95  BILITOT 0.3  PROT 6.0  ALBUMIN 3.0*    Recent Labs Lab 08/18/12 1527  LIPASE 32   No results found for this basename: AMMONIA,  in the last 168 hours CBC:  Recent Labs Lab 08/18/12 1527  WBC 9.8  NEUTROABS 7.2  HGB 5.5*  HCT 17.2*  MCV 83.9  PLT PLATELET CLUMPS NOTED ON SMEAR,  UNABLE TO ESTIMATE   Cardiac Enzymes: No results found for this basename: CKTOTAL, CKMB, CKMBINDEX, TROPONINI,  in the last 168 hours BNP: No components found with this basename: POCBNP,  CBG: No results found for this basename: GLUCAP,  in the last 168 hours  Radiological Exams on Admission: Dg Chest 2 View  08/18/2012   *RADIOLOGY REPORT*  Clinical Data: Nausea  CHEST - 2 VIEW  Comparison: March 18, 2011.  Findings: Status post coronary artery bypass graft.  Stable cardiomegaly.  Right lung is clear.  Interval development small linear density in lingular region most consistent with subsegmental atelectasis.  No pleural effusion or pneumothorax is noted.  IMPRESSION: Minimal lingular subsegmental atelectasis.  Postoperative changes as described above.   Original Report Authenticated By: Lupita Raider.,  M.D.    EKG: A Fib , no ST-T changes  Assessment/Plan Principal Problem:   GI bleed Likely diverticular bleed while on Coumadin with supratherapeutic INR Admit to telemetry. Patient ordered for 2 units PRBC transfusion. Monitor H&H every 8 hours. IV Protonix every 12 hours Discontinue Coumadin. Ordered  for 5 mg IM vitamin k Patient seen by UTI on last hospitalization. ED consulting GI. Followup with recommendations. Patient seen by [facility] on previous hospitalization and had plan for outpatient colonoscopy. Unclear if this was done.   Active Problems:   Small bowel diverticular disease As outlined above Has history of bowel resection remotely.  Nausea and vomiting No acute etiology. Chest x-ray unremarkable. Follow UA      Hypertension Resume home blood pressure medications    CHF,  Appears euvolemic. Last echo in the system with EF of 55-60%. Continue home medications. Son not sure about patient's cardiologist Continue Lasix, metoprolol and lisinopril.    Paroxysmal atrial fibrillation Controlled. Continue metoprolol. Monitor on telemetry   History of Stroke No residual weakness. Patient is on both aspirin and Coumadin .  Patient has high CHADS2 score and is on coumadin. I have discussed the risks and benefits of continue Coumadin with patient's son. We discussed the GI further.    Dementia Has moderate dementia     CKD (chronic kidney disease), stage II Appears at baseline. Continue to monitor.  DVT prophylaxis: SCDs Diet: N.p.o.     Code Status: DNR Family Communication: son at bedside Disposition Plan: return to assist living once stable  Eddie [facility] [phone]  If 7PM-7AM, please contact night-coverage [email or web address] Password [facility] 08/18/2012, 6:57 PM  Total time spent: 70 minutes

## 2012-08-18 NOTE — ED Notes (Signed)
Attempted the in and out cath did not get any urine return. Reported to the nurse.

## 2012-08-18 NOTE — ED Notes (Signed)
MD at bedside. 

## 2012-08-19 DIAGNOSIS — I509 Heart failure, unspecified: Secondary | ICD-10-CM

## 2012-08-19 DIAGNOSIS — F0390 Unspecified dementia without behavioral disturbance: Secondary | ICD-10-CM

## 2012-08-19 LAB — CBC WITH DIFFERENTIAL/PLATELET
Basophils Relative: 1 % (ref 0–1)
Eosinophils Absolute: 0.2 10*3/uL (ref 0.0–0.7)
Eosinophils Relative: 2 % (ref 0–5)
HCT: 19.8 % — ABNORMAL LOW (ref 39.0–52.0)
Hemoglobin: 6.7 g/dL — CL (ref 13.0–17.0)
MCH: 27.9 pg (ref 26.0–34.0)
MCHC: 33.8 g/dL (ref 30.0–36.0)
Monocytes Absolute: 0.9 10*3/uL (ref 0.1–1.0)
Monocytes Relative: 10 % (ref 3–12)

## 2012-08-19 LAB — CBC
HCT: 26.9 % — ABNORMAL LOW (ref 39.0–52.0)
MCV: 82 fL (ref 78.0–100.0)
RBC: 3.28 MIL/uL — ABNORMAL LOW (ref 4.22–5.81)
WBC: 7.5 10*3/uL (ref 4.0–10.5)

## 2012-08-19 MED ORDER — SODIUM CHLORIDE 0.9 % IV SOLN: INTRAVENOUS | Status: AC

## 2012-08-19 MED ORDER — PEG 3350-KCL-NA BICARB-NACL 420 G PO SOLR
2000.0000 mL | Freq: Once | ORAL | Status: AC
  Administered 2012-08-19: 2000 mL via ORAL
  Filled 2012-08-19: qty 4000

## 2012-08-19 MED ORDER — PEG 3350-KCL-NA BICARB-NACL 420 G PO SOLR: 4000.0000 mL | Freq: Once | ORAL | Status: DC

## 2012-08-19 MED ORDER — PEG 3350-KCL-NA BICARB-NACL 420 G PO SOLR
2000.0000 mL | Freq: Once | ORAL | Status: AC
  Administered 2012-08-20: 2000 mL via ORAL
  Filled 2012-08-19: qty 4000

## 2012-08-19 NOTE — Progress Notes (Signed)
Nutrition Brief Note  Patient identified on the Malnutrition Screening Tool (MST) Report for recent weight loss without trying.  Per readings below, patient's weight has been trending up.  Wt Readings from Last 20 Encounters:  03/27/12 186 lb 8.2 oz (84.6 kg)  07/09/11 178 lb 12.7 oz (81.1 kg)  03/19/11 176 lb 5.9 oz (80 kg)    BMI = 24.0 kg/m2.  Patient meets criteria for Normal based on current BMI.   Current diet order is NPO.  Labs and medications reviewed.   No nutrition interventions warranted at this time. If nutrition issues arise, please consult RD.   Maureen Chatters, RD, LDN Pager #: [phone] After-Hours Pager #: [phone]

## 2012-08-19 NOTE — Progress Notes (Signed)
Informed by central telemetry that pt. Had 4 beats of VT.  Pt. Lying in bed asleep.  VSS - Blood pressure 163/77, pulse 71, temperature 97.8 F (36.6 C), temperature source Oral, resp. rate 20, SpO2 100.00%., R/A.   Informed [place], PA, no orders given, will continue to follow.  Forbes Cellar, RN

## 2012-08-19 NOTE — Consult Note (Signed)
[facility] CONSULT Reason for consult: G.I. bleeding Referring Physician: PCP: Dr. Earl Gala. P. primary G.I.: Dr. Burman Freestone is an 79 y.o. male.  Patient was hospitalized in April with G.I. bleeding their resolve by holding his Coumadin. We saw him back in the office following the admission in April has bleeding stopped in his hemoglobin was 10.3. He was in an assisted living place and we left it up to Dr. Earl Gala whether or not to restored as Coumadin. The patient had had previous endoscopic evaluations and since he had stop bleeding discussion with his family was carried out and we decided to follow him clinically. Apparently there was a change in location of his nursing home. His family went to the beach and in return notice he was quite weak. There was one episode of vomiting. There was no gross sign of lower G.I. bleeding. The patient denies abdominal pain. He was brought to the emergency room and his stools were found to be Hemoccult positive, hemoglobin 5.5, INR only 2.07. We're asked to see him regarding any further workup. His hemoglobin was 9.0 after transfusion .patient has a history of atrial fibrillation requiring anticoagulation in a history of colon resection for diverticular disease with a temporary colostomy he also has Alzheimer's dementia.  Past Medical History  Diagnosis Date  . Hypertension   . CHF, left ventricular   . Paroxysmal atrial fibrillation   . CHF (congestive heart failure)   . Angina   . Myocardial infarction 2009  . Stroke 2011    residual:  "right sided weakness; problems walking; dementia"  . Dementia     S/P CVA 2011  . Arthritis   . Dysphasia   . A-fib   . GERD (gastroesophageal reflux disease)   . Alzheimer's dementia   . Seizures     epilepsy  . Diverticulitis   . Abdominal abscess     Past Surgical History  Procedure Laterality Date  . Coronary artery bypass graft  2009    CABG X4  . Knee arthroscopy      multiple;  bilaterally  . Total knee arthroplasty      bilateral  . Total hip arthroplasty      right  . Joint replacement      bilateral knees; right hip  . Colon surgery    . Colon resection  1980's    "had colostomy bag for awhile; went back in and put it all back together"  . Colostomy takedown  1980's    Family History  Problem Relation Age of Onset  . Stroke Mother   . Heart attack Father     Social History:  reports that he has never smoked. He has never used smokeless tobacco. He reports that he does not drink alcohol or use illicit drugs.  Allergies:  Allergies  Allergen Reactions  . Morphine And Related     Delusions    Medications; . antiseptic oral rinse  15 mL Mouth Rinse q12n4p  . azelastine  2 spray Each Nare BID  . carvedilol  3.125 mg Oral BID WC  . chlorhexidine  15 mL Mouth Rinse BID  . cyanocobalamin  1,000 mcg Intramuscular Q30 days  . donepezil  10 mg Oral QHS  . furosemide  40 mg Oral Daily  . isosorbide mononitrate  15 mg Oral Daily  . levETIRAcetam  500 mg Oral BID  . loratadine  10 mg Oral Daily  . pantoprazole (PROTONIX) IV  40 mg Intravenous Q12H  .  ramipril  5 mg Oral BID  . sertraline  25 mg Oral Daily  . sodium chloride  3 mL Intravenous Q12H  . tamsulosin  0.4 mg Oral QPC breakfast   PRN Meds nitroGLYCERIN, ondansetron (ZOFRAN) IV, ondansetron Results for orders placed during the hospital encounter of 08/18/12 (from the past 48 hour(s))  CBC WITH DIFFERENTIAL     Status: Abnormal   Collection Time    08/18/12  3:27 PM      Result Value Range   WBC 9.8  4.0 - 10.5 K/uL   RBC 2.05 (*) 4.22 - 5.81 MIL/uL   Hemoglobin 5.5 (*) 13.0 - 17.0 g/dL   Comment: REPEATED TO VERIFY     CRITICAL RESULT CALLED TO, READ BACK BY AND VERIFIED WITH:     J GAGE RN 1600 08/18/12 A BROWNING   HCT 17.2 (*) 39.0 - 52.0 %   MCV 83.9  78.0 - 100.0 fL   MCH 26.8  26.0 - 34.0 pg   MCHC 32.0  30.0 - 36.0 g/dL   RDW 16.1  09.6 - 04.5 %   Platelets PLATELET CLUMPS  NOTED ON SMEAR, UNABLE TO ESTIMATE  150 - 400 K/uL   Neutrophils Relative % 74  43 - 77 %   Lymphocytes Relative 14  12 - 46 %   Monocytes Relative 9  3 - 12 %   Eosinophils Relative 2  0 - 5 %   Basophils Relative 1  0 - 1 %   Neutro Abs 7.2  1.7 - 7.7 K/uL   Lymphs Abs 1.4  0.7 - 4.0 K/uL   Monocytes Absolute 0.9  0.1 - 1.0 K/uL   Eosinophils Absolute 0.2  0.0 - 0.7 K/uL   Basophils Absolute 0.1  0.0 - 0.1 K/uL   RBC Morphology POLYCHROMASIA PRESENT    COMPREHENSIVE METABOLIC PANEL     Status: Abnormal   Collection Time    08/18/12  3:27 PM      Result Value Range   Sodium 137  135 - 145 mEq/L   Potassium 4.3  3.5 - 5.1 mEq/L   Chloride 102  96 - 112 mEq/L   CO2 21  19 - 32 mEq/L   Glucose, Bld 106 (*) 70 - 99 mg/dL   BUN 46 (*) 6 - 23 mg/dL   Creatinine, Ser 4.09 (*) 0.50 - 1.35 mg/dL   Calcium 8.4  8.4 - 81.1 mg/dL   Total Protein 6.0  6.0 - 8.3 g/dL   Albumin 3.0 (*) 3.5 - 5.2 g/dL   AST 8  0 - 37 U/L   ALT 7  0 - 53 U/L   Alkaline Phosphatase 95  39 - 117 U/L   Total Bilirubin 0.3  0.3 - 1.2 mg/dL   GFR calc non Af Amer 34 (*) >90 mL/min   GFR calc Af Amer 40 (*) >90 mL/min   Comment:            The eGFR has been calculated     using the CKD EPI equation.     This calculation has not been     validated in all clinical     situations.     eGFR's persistently     <90 mL/min signify     possible Chronic Kidney Disease.  LIPASE, BLOOD     Status: None   Collection Time    08/18/12  3:27 PM      Result Value Range   Lipase 32  11 -  59 U/L  POCT I-STAT TROPONIN I     Status: None   Collection Time    08/18/12  3:35 PM      Result Value Range   Troponin i, poc 0.01  0.00 - 0.08 ng/mL   Comment 3            Comment: Due to the release kinetics of cTnI,     a negative result within the first hours     of the onset of symptoms does not rule out     myocardial infarction with certainty.     If myocardial infarction is still suspected,     repeat the test at  appropriate intervals.  PROTIME-INR     Status: Abnormal   Collection Time    08/18/12  4:11 PM      Result Value Range   Prothrombin Time 30.6 (*) 11.6 - 15.2 seconds   INR 3.07 (*) 0.00 - 1.49  TYPE AND SCREEN     Status: None   Collection Time    08/18/12  4:13 PM      Result Value Range   ABO/RH(D) O NEG     Antibody Screen NEG     Sample Expiration 08/21/2012     Unit Number [id number]     Blood Component Type RED CELLS,LR     Unit division 00     Status of Unit ISSUED,FINAL     Transfusion Status OK TO TRANSFUSE     Crossmatch Result Compatible     Unit Number [id number]     Blood Component Type RED CELLS,LR     Unit division 00     Status of Unit ISSUED,FINAL     Transfusion Status OK TO TRANSFUSE     Crossmatch Result Compatible     Unit Number [id number]     Blood Component Type RBC LR PHER2     Unit division 00     Status of Unit ISSUED     Transfusion Status OK TO TRANSFUSE     Crossmatch Result COMPATIBLE     Unit Number [id number]     Blood Component Type RED CELLS,LR     Unit division 00     Status of Unit ISSUED     Transfusion Status OK TO TRANSFUSE     Crossmatch Result COMPATIBLE     Unit Number [id number]     Blood Component Type RED CELLS,LR     Unit division 00     Status of Unit ALLOCATED     Transfusion Status OK TO TRANSFUSE     Crossmatch Result COMPATIBLE     Unit tag comment VERBAL ORDERS PER DR PATRICK     Unit Number [id number]     Blood Component Type RED CELLS,LR     Unit division 00     Status of Unit ALLOCATED     Transfusion Status OK TO TRANSFUSE     Crossmatch Result COMPATIBLE     Unit tag comment VERBAL ORDERS PER DR PATRICK    PREPARE RBC (CROSSMATCH)     Status: None   Collection Time    08/18/12  4:30 PM      Result Value Range   Order Confirmation ORDER PROCESSED BY BLOOD BANK    PREPARE RBC (CROSSMATCH)     Status: None   Collection Time    08/18/12  5:08 PM      Result Value Range   Order  Confirmation ORDER PROCESSED BY BLOOD BANK  OCCULT BLOOD, POC DEVICE     Status: Abnormal   Collection Time    08/18/12  5:15 PM      Result Value Range   Fecal Occult Bld POSITIVE (*) NEGATIVE  URINALYSIS, ROUTINE W REFLEX MICROSCOPIC     Status: None   Collection Time    08/18/12  6:30 PM      Result Value Range   Color, Urine YELLOW  YELLOW   APPearance CLEAR  CLEAR   Specific Gravity, Urine 1.013  1.005 - 1.030   pH 5.5  5.0 - 8.0   Glucose, UA NEGATIVE  NEGATIVE mg/dL   Hgb urine dipstick NEGATIVE  NEGATIVE   Bilirubin Urine NEGATIVE  NEGATIVE   Ketones, ur NEGATIVE  NEGATIVE mg/dL   Protein, ur NEGATIVE  NEGATIVE mg/dL   Urobilinogen, UA 0.2  0.0 - 1.0 mg/dL   Nitrite NEGATIVE  NEGATIVE   Leukocytes, UA NEGATIVE  NEGATIVE   Comment: MICROSCOPIC NOT DONE ON URINES WITH NEGATIVE PROTEIN, BLOOD, LEUKOCYTES, NITRITE, OR GLUCOSE <1000 mg/dL.  MRSA PCR SCREENING     Status: None   Collection Time    08/19/12 12:01 AM      Result Value Range   MRSA by PCR NEGATIVE  NEGATIVE   Comment:            The GeneXpert MRSA Assay (FDA     approved for NASAL specimens     only), is one component of a     comprehensive MRSA colonization     surveillance program. It is not     intended to diagnose MRSA     infection nor to guide or     monitor treatment for     MRSA infections.  PROTIME-INR     Status: Abnormal   Collection Time    08/19/12  1:27 AM      Result Value Range   Prothrombin Time 30.6 (*) 11.6 - 15.2 seconds   INR 3.07 (*) 0.00 - 1.49  CBC WITH DIFFERENTIAL     Status: Abnormal   Collection Time    08/19/12  1:27 AM      Result Value Range   WBC 8.4  4.0 - 10.5 K/uL   RBC 2.40 (*) 4.22 - 5.81 MIL/uL   Hemoglobin 6.7 (*) 13.0 - 17.0 g/dL   Comment: REPEATED TO VERIFY     CRITICAL VALUE NOTED.  VALUE IS CONSISTENT WITH PREVIOUSLY REPORTED AND CALLED VALUE.   HCT 19.8 (*) 39.0 - 52.0 %   MCV 82.5  78.0 - 100.0 fL   MCH 27.9  26.0 - 34.0 pg   MCHC 33.8  30.0 - 36.0  g/dL   RDW 11.9  14.7 - 82.9 %   Platelets 268  150 - 400 K/uL   Neutrophils Relative % 70  43 - 77 %   Neutro Abs 5.9  1.7 - 7.7 K/uL   Lymphocytes Relative 16  12 - 46 %   Lymphs Abs 1.4  0.7 - 4.0 K/uL   Monocytes Relative 10  3 - 12 %   Monocytes Absolute 0.9  0.1 - 1.0 K/uL   Eosinophils Relative 2  0 - 5 %   Eosinophils Absolute 0.2  0.0 - 0.7 K/uL   Basophils Relative 1  0 - 1 %   Basophils Absolute 0.1  0.0 - 0.1 K/uL  PREPARE RBC (CROSSMATCH)     Status: None   Collection Time    08/19/12  3:00 AM  Result Value Range   Order Confirmation ORDER PROCESSED BY BLOOD BANK    CBC     Status: Abnormal   Collection Time    08/19/12 11:41 AM      Result Value Range   WBC 7.5  4.0 - 10.5 K/uL   RBC 3.28 (*) 4.22 - 5.81 MIL/uL   Hemoglobin 9.0 (*) 13.0 - 17.0 g/dL   Comment: POST TRANSFUSION SPECIMEN   HCT 26.9 (*) 39.0 - 52.0 %   MCV 82.0  78.0 - 100.0 fL   MCH 27.4  26.0 - 34.0 pg   MCHC 33.5  30.0 - 36.0 g/dL   RDW 16.1  09.6 - 04.5 %   Platelets 246  150 - 400 K/uL    Dg Chest 2 View  08/18/2012   *RADIOLOGY REPORT*  Clinical Data: Nausea  CHEST - 2 VIEW  Comparison: March 18, 2011.  Findings: Status post coronary artery bypass graft.  Stable cardiomegaly.  Right lung is clear.  Interval development small linear density in lingular region most consistent with subsegmental atelectasis.  No pleural effusion or pneumothorax is noted.  IMPRESSION: Minimal lingular subsegmental atelectasis.  Postoperative changes as described above.   Original Report Authenticated By: Lupita Raider.,  M.D.               Blood pressure 134/65, pulse 60, temperature 97.6 F (36.4 C), temperature source Oral, resp. rate 20, SpO2 96.00%.  Physical exam:   General-- pleasant white male with dementia laying in a hospital bed in no distress. His son is in the room. Heart-- regular rate and rhythm without murmurs are gallops  Lungs--clear Abdomen-- soft and  nontender   Assessment: 1. G.I. bleeding. Source is not clear. The patient at this time is not over anticoagulant in as he was last spring. For this reason I think we should go ahead with endoscopic evaluation. I have discussed this extensively with his son.  2.Alzheimer's dementia  3.Atrial Fibrillation has been chronically anticoagulant    Plan: we will continue the patient will clear liquids for now. We'll go ahead and initiate prep and plan colonoscopy tomorrow afternoon along with EGD. I have discussed this extensively with the patient son. Hopefully, this will allow [place] to determine if he needs to be chronic anticoagulation in the future.    Loucile Posner JR,Matei Magnone L 08/19/2012, 2:16 PM

## 2012-08-19 NOTE — Progress Notes (Signed)
PATIENT DETAILS Name: Trevor Webb Age: 79 y.o. Sex: male Date of Birth: [birth year] Admit Date: 08/18/2012 Admitting Physician Eddie North, MD UEA:[id number], Dorene Sorrow, MD  Subjective: No rectal bleeding seen since hospitalization  Assessment/Plan: Principal Problem:   GI bleed -supected Diverticular -no bleeding witnessed, this admit -second episode of diverticular bleed-suspect it is time to permanently stop coumadin and just place on ASA  Active Problems: Acute Blood loss anemia -2/2 above -transfused 2 units-await post transfusion CBC -transfuse prn   Coagulopathy -2/2 coumadin therapy -second episode of diverticular bleed-suspect it is time to permanently stop coumadin and just place on ASA. Spoke with son, [name] over the phone, apparently coumadin was restarted on May with plans to see how he does-family were aware of recurrent GI bleed-since this has recurred-at this time he is agreeable to off coumadin. Will just place on ASA when able. Son aware of cardio-embolic risks, and aware that ASA will provide some protection, but not as much as coumadin. He is agreeable -since not actively bleeding, do not think we need to reverse INR-although given Vit K yesterday  ARF -suspected pre-renal -monitor lytes closely  Hypertension -BP Controlled -c/w coreg, lasix, Imdur and ramipril  Chronic Diastolic CHF -stable and euvolemic -stop IVF-saline lock  BPH -c/w Flomax  CAD-s/p CABG -no ASA at this point -monitor  Hx of CVA -stable -not on ASA at present-non focal exam -Start ASA when able  Seizure Disorder -c/w Keppra  Dementia -at baseline -c/w Aricept  Disposition: Remain inpatient  DVT Prophylaxis:  SCD's  Code Status:  DNR  Family Communication Son-[name]-over the phone  Procedures:  None  CONSULTS:  GI   MEDICATIONS: Scheduled Meds: . antiseptic oral rinse  15 mL Mouth Rinse q12n4p  . azelastine  2 spray Each Nare BID  .  carvedilol  3.125 mg Oral BID WC  . chlorhexidine  15 mL Mouth Rinse BID  . cyanocobalamin  1,000 mcg Intramuscular Q30 days  . donepezil  10 mg Oral QHS  . furosemide  40 mg Oral Daily  . isosorbide mononitrate  15 mg Oral Daily  . levETIRAcetam  500 mg Oral BID  . loratadine  10 mg Oral Daily  . pantoprazole (PROTONIX) IV  40 mg Intravenous Q12H  . ramipril  5 mg Oral BID  . sertraline  25 mg Oral Daily  . sodium chloride  3 mL Intravenous Q12H  . tamsulosin  0.4 mg Oral QPC breakfast   Continuous Infusions: . sodium chloride 75 mL/hr at 08/19/12 0001   PRN Meds:.nitroGLYCERIN, ondansetron (ZOFRAN) IV, ondansetron  Antibiotics: Anti-infectives   None       PHYSICAL EXAM: Vital signs in last 24 hours: Filed Vitals:   08/19/12 0637 08/19/12 0735 08/19/12 0803 08/19/12 0822  BP: 147/75 149/64 163/77 162/80  Pulse: 57 55 71 65  Temp: 97.6 F (36.4 C) 97.8 F (36.6 C)  98.1 F (36.7 C)  TempSrc: Oral Oral  Oral  Resp: 18 20  20   SpO2:  100%      Weight change:  There were no vitals filed for this visit. There is no weight on file to calculate BMI.   Gen Exam: Awake and and pleasantly confused   Neck: Supple, No JVD.   Chest: B/L Clear.   CVS: S1 S2 Regular, no murmurs.  Abdomen: soft, BS +, non tender, non distended.  Extremities: no edema, lower extremities warm to touch. Neurologic: Non Focal.  Skin: No Rash.   Wounds: N/A.  Intake/Output from previous day:  Intake/Output Summary (Last 24 hours) at 08/19/12 1132 Last data filed at 08/19/12 0939  Gross per 24 hour  Intake 1661.25 ml  Output   2320 ml  Net -658.75 ml     LAB RESULTS: CBC  Recent Labs Lab 08/18/12 1527 08/19/12 0127  WBC 9.8 8.4  HGB 5.5* 6.7*  HCT 17.2* 19.8*  PLT PLATELET CLUMPS NOTED ON SMEAR, UNABLE TO ESTIMATE 268  MCV 83.9 82.5  MCH 26.8 27.9  MCHC 32.0 33.8  RDW 15.0 14.6  LYMPHSABS 1.4 1.4  MONOABS 0.9 0.9  EOSABS 0.2 0.2  BASOSABS 0.1 0.1    Chemistries    Recent Labs Lab 08/18/12 1527  NA 137  K 4.3  CL 102  CO2 21  GLUCOSE 106*  BUN 46*  CREATININE 1.76*  CALCIUM 8.4    CBG: No results found for this basename: GLUCAP,  in the last 168 hours  GFR The CrCl is unknown because both a height and weight (above a minimum accepted value) are required for this calculation.  Coagulation profile  Recent Labs Lab 08/18/12 1611 08/19/12 0127  INR 3.07* 3.07*    Cardiac Enzymes No results found for this basename: CK, CKMB, TROPONINI, MYOGLOBIN,  in the last 168 hours  No components found with this basename: POCBNP,  No results found for this basename: DDIMER,  in the last 72 hours No results found for this basename: HGBA1C,  in the last 72 hours No results found for this basename: CHOL, HDL, LDLCALC, TRIG, CHOLHDL, LDLDIRECT,  in the last 72 hours No results found for this basename: TSH, T4TOTAL, FREET3, T3FREE, THYROIDAB,  in the last 72 hours No results found for this basename: VITAMINB12, FOLATE, FERRITIN, TIBC, IRON, RETICCTPCT,  in the last 72 hours  Recent Labs  08/18/12 1527  LIPASE 32    Urine Studies No results found for this basename: UACOL, UAPR, USPG, UPH, UTP, UGL, UKET, UBIL, UHGB, UNIT, UROB, ULEU, UEPI, UWBC, URBC, UBAC, CAST, CRYS, UCOM, BILUA,  in the last 72 hours  MICROBIOLOGY: Recent Results (from the past 240 hour(s))  MRSA PCR SCREENING     Status: None   Collection Time    08/19/12 12:01 AM      Result Value Range Status   MRSA by PCR NEGATIVE  NEGATIVE Final   Comment:            The GeneXpert MRSA Assay (FDA     approved for NASAL specimens     only), is one component of a     comprehensive MRSA colonization     surveillance program. It is not     intended to diagnose MRSA     infection nor to guide or     monitor treatment for     MRSA infections.    RADIOLOGY STUDIES/RESULTS: Dg Chest 2 View  08/18/2012   *RADIOLOGY REPORT*  Clinical Data: Nausea  CHEST - 2 VIEW  Comparison:  March 18, 2011.  Findings: Status post coronary artery bypass graft.  Stable cardiomegaly.  Right lung is clear.  Interval development small linear density in lingular region most consistent with subsegmental atelectasis.  No pleural effusion or pneumothorax is noted.  IMPRESSION: Minimal lingular subsegmental atelectasis.  Postoperative changes as described above.   Original Report Authenticated By: Lupita Raider.,  M.D.    Jeoffrey Massed, MD  [facility] Pager:[phone]  If 7PM-7AM, please contact night-coverage [email or web address] Password [facility] 08/19/2012, 11:32 AM   LOS:  1 day

## 2012-08-19 NOTE — Progress Notes (Signed)
Notifed Craige Cotta, NP that patient is drinking golytely for colonscopy tomorrow. Patient having large amount of dark colored stools. Craige Cotta, NP put in order for NS@75cc /hr and H&H. Will continue to monitor patient. Nelda Marseille, RN

## 2012-08-19 NOTE — Evaluation (Signed)
Physical Therapy Evaluation Patient Details Name: Trevor Webb MRN: [id number] DOB: 1928/11/16 Today's Date: 08/19/2012 Time: 7829-5621 PT Time Calculation (min): 24 min  PT Assessment / Plan / Recommendation History of Present Illness  Pt adm with GI bleed with Hgb of 5.5.  Pt received blood.  Clinical Impression  Pt admitted with above. Pt currently with functional limitations due to the deficits listed below (see PT Problem List).  Pt will benefit from skilled PT to increase their independence and safety with mobility to allow discharge to his assisted living facility with PT there.     PT Assessment  Patient needs continued PT services    Follow Up Recommendations  Other (comment) (PT at ALF)    Does the patient have the potential to tolerate intense rehabilitation      Barriers to Discharge        Equipment Recommendations  None recommended by PT    Recommendations for Other Services     Frequency Min 3X/week    Precautions / Restrictions Precautions Precautions: Fall   Pertinent Vitals/Pain No c/o's      Mobility  Bed Mobility Bed Mobility: Supine to Sit;Sitting - Scoot to Edge of Bed Supine to Sit: 1: +2 Total assist Supine to Sit: Patient Percentage: 60% Sitting - Scoot to Edge of Bed: 1: +2 Total assist Sitting - Scoot to Edge of Bed: Patient Percentage: 50% Details for Bed Mobility Assistance: Assist to bring trunk up.  Difficult due to limited mobility of rt hip. Transfers Transfers: Sit to Stand;Stand to Sit Sit to Stand: 1: +2 Total assist;With upper extremity assist Sit to Stand: Patient Percentage: 50% Stand to Sit: 1: +2 Total assist;With upper extremity assist;With armrests;To chair/3-in-1 Stand to Sit: Patient Percentage: 50% Details for Transfer Assistance: Extensive lifting assistance at hips due to pt's inability to flex at rt hip in order to get weight anteriorly. Ambulation/Gait Ambulation/Gait Assistance: 4: Min assist Ambulation  Distance (Feet): 40 Feet Assistive device: Rolling walker Ambulation/Gait Assistance Details: Verbal cues to incr rt step length as pt began to fatigue Gait Pattern: Step-to pattern;Decreased step length - right;Decreased dorsiflexion - right Gait velocity: decr    Exercises     PT Diagnosis: Difficulty walking;Generalized weakness;Abnormality of gait  PT Problem List: Decreased strength;Decreased range of motion;Decreased activity tolerance;Decreased balance;Decreased mobility PT Treatment Interventions: DME instruction;Gait training;Functional mobility training;Therapeutic activities;Therapeutic exercise;Patient/family education;Balance training     PT Goals(Current goals can be found in the care plan section) Acute Rehab PT Goals Patient Stated Goal: Pt agreeable to work toward incr mobility. PT Goal Formulation: With patient/family Time For Goal Achievement: 08/26/12 Potential to Achieve Goals: Good  Visit Information  Last PT Received On: 08/19/12 Assistance Needed: +2 History of Present Illness: Pt adm with GI bleed with Hgb of 5.5.  Pt received blood.       Prior Functioning  Home Living Family/patient expects to be discharged to:: Assisted living ([facility]) Home Equipment: Wheelchair - [employer] - 2 wheels Prior Function Level of Independence: Needs assistance Gait / Transfers Assistance Needed: Assist to amb short distances.  More assist needed for sit to stand and supine to sit due to bad rt hip which prevent forward flexion of trunk in sitting. Communication Communication: No difficulties Dominant Hand: Right    Cognition  Cognition Arousal/Alertness: Awake/alert Behavior During Therapy: WFL for tasks assessed/performed Overall Cognitive Status: History of cognitive impairments - at baseline Memory: Decreased short-term memory    Extremity/Trunk Assessment Lower Extremity Assessment Lower Extremity Assessment:  RLE deficits/detail;LLE  deficits/detail RLE Deficits / Details: Limited hip flexion due to complications with prior THA.  Functionally knee 3+/5 and ankle 3-/5 LLE Deficits / Details: grossly functionally 3/5   Balance Balance Balance Assessed: Yes Static Sitting Balance Static Sitting - Balance Support: Bilateral upper extremity supported Static Sitting - Level of Assistance: 3: Mod assist Static Standing Balance Static Standing - Balance Support: Bilateral upper extremity supported Static Standing - Level of Assistance: 4: Min assist  End of Session PT - End of Session Equipment Utilized During Treatment: Gait belt Activity Tolerance: Patient tolerated treatment well Patient left: in chair;with call bell/phone within reach;with family/visitor present Nurse Communication: Mobility status  GP     Shantele Reller 08/19/2012, 2:03 PM  [employer] PT [phone]

## 2012-08-19 NOTE — Progress Notes (Addendum)
Admitted pt.from ED ,bec.of weakness.He was brought to ED.from  [facility] with the son accomp.by son.He was found to have HGB=6.5, & was ordered 2 units of PRBC  1ST Unit of PRBC was started in ED.Pt.is  Alert oriented to self,disoriented to place & time.His skin with echymosis to his arms & reddened sacral area.Pt.is a DNR.;Oriented pt.to room  & discussed with the  & pt.fall prevention safety plan.& instructed to use call bell for any help.Call bell with in reach.Placed pt.on TELE.Will continue to monitor pt.

## 2012-08-19 NOTE — Care Management Note (Unsigned)
    Page 1 of 2   08/20/2012     2:26:41 PM   CARE MANAGEMENT NOTE 08/20/2012  Patient:  Trevor Webb, Trevor Webb   Account Number:  [id number]  Date Initiated:  08/19/2012  Documentation initiated by:  Letha Cape  Subjective/Objective Assessment:   dx gib  admit- from [facility]- ALF     Action/Plan:   pt eval- recs hhpt   Anticipated DC Date:  08/21/2012   Anticipated DC Plan:  ASSISTED LIVING / REST HOME  In-house referral  Clinical Social Worker      DC Associate Professor  CM consult      [facility] Choice  HOME HEALTH   Choice offered to / List presented to:          HH arranged  HH-2 PT      HH agency  OTHER - SEE NOTE   Status of service:  In process, will continue to follow Medicare Important Message given?   (If response is "NO", the following Medicare IM given date fields will be blank) Date Medicare IM given:   Date Additional Medicare IM given:    Discharge Disposition:    Per UR Regulation:  Reviewed for med. necessity/level of care/duration of stay  If discussed at Long Length of Stay Meetings, dates discussed:    Comments:  08/20/12 14:25 Letha Cape RN, BSN [phone] patient will need hhpt when he returns to ALF, the ALF state they provide the hhpt for the patient, will just need to be on the fl2.  08/19/12  16:48 Letha Cape RN, BSN [phone] patient is from [facility], per physical therapy recs hhpt.  CSW referral.

## 2012-08-20 ENCOUNTER — Encounter (HOSPITAL_COMMUNITY)
Admission: EM | Disposition: A | Discharge status: Home / Self Care | Payer: Self-pay | Source: Home / Self Care | Attending: Internal Medicine

## 2012-08-20 ENCOUNTER — Encounter (HOSPITAL_COMMUNITY): Payer: Self-pay

## 2012-08-20 HISTORY — PX: COLONOSCOPY: SHX5424

## 2012-08-20 HISTORY — PX: ESOPHAGOGASTRODUODENOSCOPY: SHX5428

## 2012-08-20 LAB — BASIC METABOLIC PANEL
BUN: 23 mg/dL (ref 6–23)
CO2: 27 mEq/L (ref 19–32)
Calcium: 8.1 mg/dL — ABNORMAL LOW (ref 8.4–10.5)
Glucose, Bld: 84 mg/dL (ref 70–99)
Sodium: 142 mEq/L (ref 135–145)

## 2012-08-20 LAB — CBC
HCT: 29.1 % — ABNORMAL LOW (ref 39.0–52.0)
Hemoglobin: 10.5 g/dL — ABNORMAL LOW (ref 13.0–17.0)
MCH: 28.4 pg (ref 26.0–34.0)
Platelets: 257 10*3/uL (ref 150–400)
RBC: 3.49 MIL/uL — ABNORMAL LOW (ref 4.22–5.81)
RBC: 3.7 MIL/uL — ABNORMAL LOW (ref 4.22–5.81)
RDW: 14.6 % (ref 11.5–15.5)
WBC: 6.4 10*3/uL (ref 4.0–10.5)

## 2012-08-20 LAB — HEMOGLOBIN AND HEMATOCRIT, BLOOD: HCT: 28.3 % — ABNORMAL LOW (ref 39.0–52.0)

## 2012-08-20 LAB — OCCULT BLOOD X 1 CARD TO LAB, STOOL: Fecal Occult Bld: POSITIVE — AB

## 2012-08-20 LAB — PROTIME-INR: INR: 1.34 (ref 0.00–1.49)

## 2012-08-20 SURGERY — EGD (ESOPHAGOGASTRODUODENOSCOPY)
Anesthesia: Moderate Sedation

## 2012-08-20 MED ORDER — MIDAZOLAM HCL 10 MG/2ML IJ SOLN
INTRAMUSCULAR | Status: DC | PRN
  Administered 2012-08-20: 1 mg via INTRAVENOUS

## 2012-08-20 MED ORDER — CARVEDILOL 3.125 MG PO TABS
3.1250 mg | ORAL_TABLET | Freq: Two times a day (BID) | ORAL | Status: DC
  Administered 2012-08-21 – 2012-08-27 (×12): 3.125 mg via ORAL
  Filled 2012-08-20 (×17): qty 1

## 2012-08-20 MED ORDER — SODIUM CHLORIDE 0.9 % IV SOLN
INTRAVENOUS | Status: DC
  Administered 2012-08-20: 10:00:00 via INTRAVENOUS

## 2012-08-20 MED ORDER — MIDAZOLAM HCL 5 MG/ML IJ SOLN
INTRAMUSCULAR | Status: AC
Start: 2012-08-20 — End: 2012-08-20
  Filled 2012-08-20: qty 2

## 2012-08-20 MED ORDER — FENTANYL CITRATE 0.05 MG/ML IJ SOLN
INTRAMUSCULAR | Status: DC | PRN
  Administered 2012-08-20 (×2): 12.5 ug via INTRAVENOUS

## 2012-08-20 MED ORDER — BUTAMBEN-TETRACAINE-BENZOCAINE 2-2-14 % EX AERO
INHALATION_SPRAY | CUTANEOUS | Status: DC | PRN
  Administered 2012-08-20: 2 via TOPICAL

## 2012-08-20 MED ORDER — FENTANYL CITRATE 0.05 MG/ML IJ SOLN
INTRAMUSCULAR | Status: AC
  Filled 2012-08-20: qty 2

## 2012-08-20 NOTE — Op Note (Signed)
[facility] [address] [place], [address]   ENDOSCOPY PROCEDURE REPORT  PATIENT: Trevor, Webb  MR#: [id number] BIRTHDATE: February 01, 1928 , 84  yrs. old GENDER: Male ENDOSCOPIST:Salvador Coupe, MD REFERRED BY:  [facility] PROCEDURE DATE:  08/20/2012 PROCEDURE:   EGD ASA CLASS:     class 3 INDICATIONS:   persistent positive stool and drop hemoglobin in gentlemen who has been on anticoagulation MEDICATION:   fentanyl 12.5 micrograms, versed 1 mg IV TOPICAL ANESTHETIC:    cetacaine spray  DESCRIPTION OF PROCEDURE:   The Pentax upper scope was inserted into the esophagus with swallowing. The esophagus was normal there was no sign of active bleeding. No esophagitis, Barrett's esophagus etc. The scope past into the stomach. The stomach was normal in the forward and retroflex view there was no sign of active bleeding. Pyloric channel was normal. The duodenum was entered in the duodenal bulb and 2nd duodenum were normal with no signs of active recently bleeding. The patient tolerated procedure well     COMPLICATIONS: None  ENDOSCOPIC IMPRESSION: 1.Recent Subacute G.I. Bleeding. EGD normal with no sign of active or recent bleeding.  RECOMMENDATIONS: will proceed with colonoscopy at this time    _______________________________ eSigned:  Carman Ching, MD 08/20/2012 2:07 PM

## 2012-08-20 NOTE — Interval H&P Note (Signed)
History and Physical Interval Note:  08/20/2012 1:04 PM  Trevor Webb  has presented today for surgery, with the diagnosis of gi bleeding  The various methods of treatment have been discussed with the patient and family. After consideration of risks, benefits and other options for treatment, the patient has consented to  Procedure(s): ESOPHAGOGASTRODUODENOSCOPY (EGD) (N/A) COLONOSCOPY (N/A) as a surgical intervention .  The patient's history has been reviewed, patient examined, no change in status, stable for surgery.  I have reviewed the patient's chart and labs.  Questions were answered to the patient's satisfaction.     [name],[name]

## 2012-08-20 NOTE — Progress Notes (Addendum)
Patient Trevor Webb down less than 40,md notified no new order given.Will continue to monitor patient.

## 2012-08-20 NOTE — H&P (View-Only) (Signed)
[facility] Reason for consult: G.I. bleeding Referring Physician: PCP: Dr. Osborne. P. primary G.I.: Dr. Buccini  Trevor Webb is an 79 y.o. male.  Patient was hospitalized in April with G.I. bleeding their resolve by holding his Coumadin. We saw him back in the office following the admission in April has bleeding stopped in his hemoglobin was 10.3. He was in an assisted living place and we left it up to Dr. Osborne whether or not to restored as Coumadin. The patient had had previous endoscopic evaluations and since he had stop bleeding discussion with his family was carried out and we decided to follow him clinically. Apparently there was a change in location of his nursing home. His family went to the beach and in return notice he was quite weak. There was one episode of vomiting. There was no gross sign of lower G.I. bleeding. The patient denies abdominal pain. He was brought to the emergency room and his stools were found to be Hemoccult positive, hemoglobin 5.5, INR only 2.07. We're asked to see him regarding any further workup. His hemoglobin was 9.0 after transfusion .patient has a history of atrial fibrillation requiring anticoagulation in a history of colon resection for diverticular disease with a temporary colostomy he also has Alzheimer's dementia.  Past Medical History  Diagnosis Date  . Hypertension   . CHF, left ventricular   . Paroxysmal atrial fibrillation   . CHF (congestive heart failure)   . Angina   . Myocardial infarction 2009  . Stroke 2011    residual:  "right sided weakness; problems walking; dementia"  . Dementia     S/P CVA 2011  . Arthritis   . Dysphasia   . A-fib   . GERD (gastroesophageal reflux disease)   . Alzheimer's dementia   . Seizures     epilepsy  . Diverticulitis   . Abdominal abscess     Past Surgical History  Procedure Laterality Date  . Coronary artery bypass graft  2009    CABG X4  . Knee arthroscopy      multiple;  bilaterally  . Total knee arthroplasty      bilateral  . Total hip arthroplasty      right  . Joint replacement      bilateral knees; right hip  . Colon surgery    . Colon resection  1980's    "had colostomy bag for awhile; went back in and put it all back together"  . Colostomy takedown  1980's    Family History  Problem Relation Age of Onset  . Stroke Mother   . Heart attack Father     Social History:  reports that he has never smoked. He has never used smokeless tobacco. He reports that he does not drink alcohol or use illicit drugs.  Allergies:  Allergies  Allergen Reactions  . Morphine And Related     Delusions    Medications; . antiseptic oral rinse  15 mL Mouth Rinse q12n4p  . azelastine  2 spray Each Nare BID  . carvedilol  3.125 mg Oral BID WC  . chlorhexidine  15 mL Mouth Rinse BID  . cyanocobalamin  1,000 mcg Intramuscular Q30 days  . donepezil  10 mg Oral QHS  . furosemide  40 mg Oral Daily  . isosorbide mononitrate  15 mg Oral Daily  . levETIRAcetam  500 mg Oral BID  . loratadine  10 mg Oral Daily  . pantoprazole (PROTONIX) IV  40 mg Intravenous Q12H  .   ramipril  5 mg Oral BID  . sertraline  25 mg Oral Daily  . sodium chloride  3 mL Intravenous Q12H  . tamsulosin  0.4 mg Oral QPC breakfast   PRN Meds nitroGLYCERIN, ondansetron (ZOFRAN) IV, ondansetron Results for orders placed during the hospital encounter of 08/18/12 (from the past 48 hour(s))  CBC WITH DIFFERENTIAL     Status: Abnormal   Collection Time    08/18/12  3:27 PM      Result Value Range   WBC 9.8  4.0 - 10.5 K/uL   RBC 2.05 (*) 4.22 - 5.81 MIL/uL   Hemoglobin 5.5 (*) 13.0 - 17.0 g/dL   Comment: REPEATED TO VERIFY     CRITICAL RESULT CALLED TO, READ BACK BY AND VERIFIED WITH:     J GAGE RN 1600 08/18/12 A BROWNING   HCT 17.2 (*) 39.0 - 52.0 %   MCV 83.9  78.0 - 100.0 fL   MCH 26.8  26.0 - 34.0 pg   MCHC 32.0  30.0 - 36.0 g/dL   RDW 15.0  11.5 - 15.5 %   Platelets PLATELET CLUMPS  NOTED ON SMEAR, UNABLE TO ESTIMATE  150 - 400 K/uL   Neutrophils Relative % 74  43 - 77 %   Lymphocytes Relative 14  12 - 46 %   Monocytes Relative 9  3 - 12 %   Eosinophils Relative 2  0 - 5 %   Basophils Relative 1  0 - 1 %   Neutro Abs 7.2  1.7 - 7.7 K/uL   Lymphs Abs 1.4  0.7 - 4.0 K/uL   Monocytes Absolute 0.9  0.1 - 1.0 K/uL   Eosinophils Absolute 0.2  0.0 - 0.7 K/uL   Basophils Absolute 0.1  0.0 - 0.1 K/uL   RBC Morphology POLYCHROMASIA PRESENT    COMPREHENSIVE METABOLIC PANEL     Status: Abnormal   Collection Time    08/18/12  3:27 PM      Result Value Range   Sodium 137  135 - 145 mEq/L   Potassium 4.3  3.5 - 5.1 mEq/L   Chloride 102  96 - 112 mEq/L   CO2 21  19 - 32 mEq/L   Glucose, Bld 106 (*) 70 - 99 mg/dL   BUN 46 (*) 6 - 23 mg/dL   Creatinine, Ser 1.76 (*) 0.50 - 1.35 mg/dL   Calcium 8.4  8.4 - 10.5 mg/dL   Total Protein 6.0  6.0 - 8.3 g/dL   Albumin 3.0 (*) 3.5 - 5.2 g/dL   AST 8  0 - 37 U/L   ALT 7  0 - 53 U/L   Alkaline Phosphatase 95  39 - 117 U/L   Total Bilirubin 0.3  0.3 - 1.2 mg/dL   GFR calc non Af Amer 34 (*) >90 mL/min   GFR calc Af Amer 40 (*) >90 mL/min   Comment:            The eGFR has been calculated     using the CKD EPI equation.     This calculation has not been     validated in all clinical     situations.     eGFR's persistently     <90 mL/min signify     possible Chronic Kidney Disease.  LIPASE, BLOOD     Status: None   Collection Time    08/18/12  3:27 PM      Result Value Range   Lipase 32  11 -   59 U/L  POCT I-STAT TROPONIN I     Status: None   Collection Time    08/18/12  3:35 PM      Result Value Range   Troponin i, poc 0.01  0.00 - 0.08 ng/mL   Comment 3            Comment: Due to the release kinetics of cTnI,     a negative result within the first hours     of the onset of symptoms does not rule out     myocardial infarction with certainty.     If myocardial infarction is still suspected,     repeat the test at  appropriate intervals.  PROTIME-INR     Status: Abnormal   Collection Time    08/18/12  4:11 PM      Result Value Range   Prothrombin Time 30.6 (*) 11.6 - 15.2 seconds   INR 3.07 (*) 0.00 - 1.49  TYPE AND SCREEN     Status: None   Collection Time    08/18/12  4:13 PM      Result Value Range   ABO/RH(D) O NEG     Antibody Screen NEG     Sample Expiration 08/21/2012     Unit Number [id number]     Blood Component Type RED CELLS,LR     Unit division 00     Status of Unit ISSUED,FINAL     Transfusion Status OK TO TRANSFUSE     Crossmatch Result Compatible     Unit Number [id number]     Blood Component Type RED CELLS,LR     Unit division 00     Status of Unit ISSUED,FINAL     Transfusion Status OK TO TRANSFUSE     Crossmatch Result Compatible     Unit Number [id number]     Blood Component Type RBC LR PHER2     Unit division 00     Status of Unit ISSUED     Transfusion Status OK TO TRANSFUSE     Crossmatch Result COMPATIBLE     Unit Number [id number]     Blood Component Type RED CELLS,LR     Unit division 00     Status of Unit ISSUED     Transfusion Status OK TO TRANSFUSE     Crossmatch Result COMPATIBLE     Unit Number [id number]     Blood Component Type RED CELLS,LR     Unit division 00     Status of Unit ALLOCATED     Transfusion Status OK TO TRANSFUSE     Crossmatch Result COMPATIBLE     Unit tag comment VERBAL ORDERS PER DR PATRICK     Unit Number [id number]     Blood Component Type RED CELLS,LR     Unit division 00     Status of Unit ALLOCATED     Transfusion Status OK TO TRANSFUSE     Crossmatch Result COMPATIBLE     Unit tag comment VERBAL ORDERS PER DR PATRICK    PREPARE RBC (CROSSMATCH)     Status: None   Collection Time    08/18/12  4:30 PM      Result Value Range   Order Confirmation ORDER PROCESSED BY BLOOD BANK    PREPARE RBC (CROSSMATCH)     Status: None   Collection Time    08/18/12  5:08 PM      Result Value Range   Order  Confirmation ORDER PROCESSED BY BLOOD BANK      OCCULT BLOOD, POC DEVICE     Status: Abnormal   Collection Time    08/18/12  5:15 PM      Result Value Range   Fecal Occult Bld POSITIVE (*) NEGATIVE  URINALYSIS, ROUTINE W REFLEX MICROSCOPIC     Status: None   Collection Time    08/18/12  6:30 PM      Result Value Range   Color, Urine YELLOW  YELLOW   APPearance CLEAR  CLEAR   Specific Gravity, Urine 1.013  1.005 - 1.030   pH 5.5  5.0 - 8.0   Glucose, UA NEGATIVE  NEGATIVE mg/dL   Hgb urine dipstick NEGATIVE  NEGATIVE   Bilirubin Urine NEGATIVE  NEGATIVE   Ketones, ur NEGATIVE  NEGATIVE mg/dL   Protein, ur NEGATIVE  NEGATIVE mg/dL   Urobilinogen, UA 0.2  0.0 - 1.0 mg/dL   Nitrite NEGATIVE  NEGATIVE   Leukocytes, UA NEGATIVE  NEGATIVE   Comment: MICROSCOPIC NOT DONE ON URINES WITH NEGATIVE PROTEIN, BLOOD, LEUKOCYTES, NITRITE, OR GLUCOSE <1000 mg/dL.  MRSA PCR SCREENING     Status: None   Collection Time    08/19/12 12:01 AM      Result Value Range   MRSA by PCR NEGATIVE  NEGATIVE   Comment:            The GeneXpert MRSA Assay (FDA     approved for NASAL specimens     only), is one component of a     comprehensive MRSA colonization     surveillance program. It is not     intended to diagnose MRSA     infection nor to guide or     monitor treatment for     MRSA infections.  PROTIME-INR     Status: Abnormal   Collection Time    08/19/12  1:27 AM      Result Value Range   Prothrombin Time 30.6 (*) 11.6 - 15.2 seconds   INR 3.07 (*) 0.00 - 1.49  CBC WITH DIFFERENTIAL     Status: Abnormal   Collection Time    08/19/12  1:27 AM      Result Value Range   WBC 8.4  4.0 - 10.5 K/uL   RBC 2.40 (*) 4.22 - 5.81 MIL/uL   Hemoglobin 6.7 (*) 13.0 - 17.0 g/dL   Comment: REPEATED TO VERIFY     CRITICAL VALUE NOTED.  VALUE IS CONSISTENT WITH PREVIOUSLY REPORTED AND CALLED VALUE.   HCT 19.8 (*) 39.0 - 52.0 %   MCV 82.5  78.0 - 100.0 fL   MCH 27.9  26.0 - 34.0 pg   MCHC 33.8  30.0 - 36.0  g/dL   RDW 14.6  11.5 - 15.5 %   Platelets 268  150 - 400 K/uL   Neutrophils Relative % 70  43 - 77 %   Neutro Abs 5.9  1.7 - 7.7 K/uL   Lymphocytes Relative 16  12 - 46 %   Lymphs Abs 1.4  0.7 - 4.0 K/uL   Monocytes Relative 10  3 - 12 %   Monocytes Absolute 0.9  0.1 - 1.0 K/uL   Eosinophils Relative 2  0 - 5 %   Eosinophils Absolute 0.2  0.0 - 0.7 K/uL   Basophils Relative 1  0 - 1 %   Basophils Absolute 0.1  0.0 - 0.1 K/uL  PREPARE RBC (CROSSMATCH)     Status: None   Collection Time    08/19/12  3:00 AM        Result Value Range   Order Confirmation ORDER PROCESSED BY BLOOD BANK    CBC     Status: Abnormal   Collection Time    08/19/12 11:41 AM      Result Value Range   WBC 7.5  4.0 - 10.5 K/uL   RBC 3.28 (*) 4.22 - 5.81 MIL/uL   Hemoglobin 9.0 (*) 13.0 - 17.0 g/dL   Comment: POST TRANSFUSION SPECIMEN   HCT 26.9 (*) 39.0 - 52.0 %   MCV 82.0  78.0 - 100.0 fL   MCH 27.4  26.0 - 34.0 pg   MCHC 33.5  30.0 - 36.0 g/dL   RDW 14.6  11.5 - 15.5 %   Platelets 246  150 - 400 K/uL    Dg Chest 2 View  08/18/2012   *RADIOLOGY REPORT*  Clinical Data: Nausea  CHEST - 2 VIEW  Comparison: March 18, 2011.  Findings: Status post coronary artery bypass graft.  Stable cardiomegaly.  Right lung is clear.  Interval development small linear density in lingular region most consistent with subsegmental atelectasis.  No pleural effusion or pneumothorax is noted.  IMPRESSION: Minimal lingular subsegmental atelectasis.  Postoperative changes as described above.   Original Report Authenticated By: Mylee Falin Green Jr.,  M.D.               Blood pressure 134/65, pulse 60, temperature 97.6 F (36.4 C), temperature source Oral, resp. rate 20, SpO2 96.00%.  Physical exam:   General-- pleasant white male with dementia laying in a hospital bed in no distress. His son is in the room. Heart-- regular rate and rhythm without murmurs are gallops  Lungs--clear Abdomen-- soft and  nontender   Assessment: 1. G.I. bleeding. Source is not clear. The patient at this time is not over anticoagulant in as he was last spring. For this reason I think we should go ahead with endoscopic evaluation. I have discussed this extensively with his son.  2.Alzheimer's dementia  3.Atrial Fibrillation has been chronically anticoagulant    Plan: we will continue the patient will clear liquids for now. We'll go ahead and initiate prep and plan colonoscopy tomorrow afternoon along with EGD. I have discussed this extensively with the patient son. Hopefully, this will allow us to determine if he needs to be chronic anticoagulation in the future.    Rowena Moilanen JR,Joshva Labreck L 08/19/2012, 2:16 PM      

## 2012-08-20 NOTE — Progress Notes (Signed)
PATIENT DETAILS Name: Trevor Webb Age: 79 y.o. Sex: male Date of Birth: 08/26/1928 Admit Date: 08/18/2012 Admitting Physician Eddie North, MD WGN:[id number], Dorene Sorrow, MD  Subjective: Experienced blood per rectum when drinking colonoscopy prep. Has no complaints of pain.  Son, [name] at bedside  Assessment/Plan: Principal Problem:  GI bleed -supected Diverticular -second episode of diverticular bleed-suspect it is time to permanently stop coumadin and just place on ASA  Active Problems: Acute Blood loss anemia -2/2 above -transfused 4 units-Hb stable post-transfuse prn   Coagulopathy -2/2 coumadin therapy -second episode of diverticular bleed-suspect it is time to permanently stop coumadin and just place on ASA. Dr Jerral Ralph, Spoke with son, [name] over the phone, apparently coumadin was restarted on May with plans to see how he does-family were aware of recurrent GI bleed-since this has recurred-at this time he is agreeable to off coumadin. Will just place on ASA when able. Son aware of cardio-embolic risks, and aware that ASA will provide some protection, but not as much as coumadin. He is agreeable -INR now 1.3  Bradycardia Episode of pulse in the 30s. Patient feels weak (after colon prep) but doesn't appear symptomatic Hold parameters placed on Coreg.  May need decreased dose. Per son, had an episode of bradycardia last hospitalization as well.  ARF -suspected pre-renal -monitor lytes closely  Hypertension -BP Controlled -c/w coreg, lasix, Imdur and ramipril.  Re-eval coreg dose if bradycardia continues.  Chronic Diastolic CHF -stable and euvolemic -stop IVF-saline lock  BPH -c/w Flomax  CAD-s/p CABG -no ASA at this point -monitor  Hx of CVA -stable -not on ASA at present-non focal exam -Start ASA when able  Seizure Disorder -c/w Keppra  Dementia -at baseline -c/w Aricept  Disposition: Remain inpatient  DVT Prophylaxis:  SCD's  Code  Status:  DNR  Family Communication Son-[name]-over the phone  Procedures:  None  CONSULTS:  GI   MEDICATIONS: Scheduled Meds: . antiseptic oral rinse  15 mL Mouth Rinse q12n4p  . azelastine  2 spray Each Nare BID  . carvedilol  3.125 mg Oral BID WC  . chlorhexidine  15 mL Mouth Rinse BID  . cyanocobalamin  1,000 mcg Intramuscular Q30 days  . donepezil  10 mg Oral QHS  . furosemide  40 mg Oral Daily  . isosorbide mononitrate  15 mg Oral Daily  . levETIRAcetam  500 mg Oral BID  . loratadine  10 mg Oral Daily  . pantoprazole (PROTONIX) IV  40 mg Intravenous Q12H  . ramipril  5 mg Oral BID  . sertraline  25 mg Oral Daily  . sodium chloride  3 mL Intravenous Q12H  . tamsulosin  0.4 mg Oral QPC breakfast   Continuous Infusions: . sodium chloride 20 mL/hr at 08/20/12 0937   PRN Meds:.nitroGLYCERIN, ondansetron (ZOFRAN) IV, ondansetron  PHYSICAL EXAM: Vital signs in last 24 hours: Filed Vitals:   08/19/12 2202 08/20/12 0531 08/20/12 0742 08/20/12 0932  BP:  163/95 171/64 142/63  Pulse:  71 65   Temp:  97.7 F (36.5 C)    TempSrc:  Oral    Resp:  20    Height: 5\' 9"  (1.753 m)     Weight: 73.5 kg (162 lb 0.6 oz)     SpO2:  96%      Weight change:  Filed Weights   08/19/12 2202  Weight: 73.5 kg (162 lb 0.6 oz)   Body mass index is 23.92 kg/(m^2).   Gen Exam: Awake and and pleasantly confused   Neck: Supple,  No JVD.   Chest: B/L Clear.  No accessory muscle use CVS: S1 S2 Regular, no murmurs.  Abdomen: soft, BS +, non tender, non distended.  Extremities: no edema, lower extremities warm to touch. Neurologic: Non Focal.  Skin: No Rash.     Intake/Output from previous day:  Intake/Output Summary (Last 24 hours) at 08/20/12 1210 Last data filed at 08/20/12 0612  Gross per 24 hour  Intake   2465 ml  Output    450 ml  Net   2015 ml     LAB RESULTS: CBC  Recent Labs Lab 08/18/12 1527 08/19/12 0127 08/19/12 1141 08/19/12 2340 08/20/12 0930  WBC  9.8 8.4 7.5  --  6.4  HGB 5.5* 6.7* 9.0* 9.3* 9.7*  HCT 17.2* 19.8* 26.9* 27.8* 29.1*  PLT PLATELET CLUMPS NOTED ON SMEAR, UNABLE TO ESTIMATE 268 246  --  257  MCV 83.9 82.5 82.0  --  83.4  MCH 26.8 27.9 27.4  --  27.8  MCHC 32.0 33.8 33.5  --  33.3  RDW 15.0 14.6 14.6  --  14.6  LYMPHSABS 1.4 1.4  --   --   --   MONOABS 0.9 0.9  --   --   --   EOSABS 0.2 0.2  --   --   --   BASOSABS 0.1 0.1  --   --   --     Chemistries   Recent Labs Lab 08/18/12 1527  NA 137  K 4.3  CL 102  CO2 21  GLUCOSE 106*  BUN 46*  CREATININE 1.76*  CALCIUM 8.4    Coagulation profile  Recent Labs Lab 08/18/12 1611 08/19/12 0127  INR 3.07* 3.07*     Recent Labs  08/18/12 1527  LIPASE 32    MICROBIOLOGY: Recent Results (from the past 240 hour(s))  MRSA PCR SCREENING     Status: None   Collection Time    08/19/12 12:01 AM      Result Value Range Status   MRSA by PCR NEGATIVE  NEGATIVE Final   Comment:            The GeneXpert MRSA Assay (FDA     approved for NASAL specimens     only), is one component of a     comprehensive MRSA colonization     surveillance program. It is not     intended to diagnose MRSA     infection nor to guide or     monitor treatment for     MRSA infections.    RADIOLOGY STUDIES/RESULTS: Dg Chest 2 View  08/18/2012   *RADIOLOGY REPORT*  Clinical Data: Nausea  CHEST - 2 VIEW  Comparison: March 18, 2011.  Findings: Status post coronary artery bypass graft.  Stable cardiomegaly.  Right lung is clear.  Interval development small linear density in lingular region most consistent with subsegmental atelectasis.  No pleural effusion or pneumothorax is noted.  IMPRESSION: Minimal lingular subsegmental atelectasis.  Postoperative changes as described above.   Original Report Authenticated By: Lupita Raider.,  M.D.    [place] [facility] Pager:[phone]  If 7PM-7AM, please contact night-coverage [email or web address] Password  [email or web address] 08/20/2012, 12:10 PM   LOS: 2 days    Attending Patient seen and examined, agree with the assessment and plan as outlined above. Stable Hb. Suspect home in the next day or so. Remain off coumadin from now on.  S Ghimire

## 2012-08-20 NOTE — Clinical Social Work Psychosocial (Signed)
Clinical Social Work Department BRIEF PSYCHOSOCIAL ASSESSMENT 08/20/2012  Patient:  Trevor Webb, Trevor Webb     Account Number:  [id number]     Admit date:  08/18/2012  Clinical Social Worker:  Lavell Luster  Date/Time:  08/20/2012 11:39 AM  Referred by:  Physician  Date Referred:  08/20/2012 Referred for  ALF Placement   Other Referral:   Interview type:  Family Other interview type:   CSW interviewed patient's son. Patient was not alert and oriented at time of assessment.    PSYCHOSOCIAL DATA Living Status:  FACILITY Admitted from facility:  [facility] Level of care:  Assisted Living Primary support name:  Trevor Webb (son) [phone] Primary support relationship to patient:  CHILD, ADULT Degree of support available:   Patient's son is very supportive and helps patient with health care decisions. Patient's son has been to hospital to visit with patient everyday.    CURRENT CONCERNS Current Concerns  Post-Acute Placement   Other Concerns:    SOCIAL WORK ASSESSMENT / PLAN CSW met with patient and patient's son. Patient was not alert and oriented at time of visit. CSW interviewed son. CSW wanted to confirm patient's place to return to [facility]. Patient's son stated that patient plans to return to [facility] upon discharge. CSW informed patient's son that CSW will help facilitate patient's DC back to [facility]. CSW contacted [facility] to confirm that they would be taking patient back. The patient's bed is still available.   Assessment/plan status:  No Further Intervention Required Other assessment/ plan:   Update FL2 and get DC Summary. Send both to [facility].   Information/referral to community resources:   No information or resources were given to patient or patient's son.    PATIENT'S/FAMILY'S RESPONSE TO PLAN OF CARE: Patient was not alert and oriented at time of CSW visit. Patient's son was pleasant and agreeable to the plan to DC  patient back to ALF.       Roddie Mc, [place], [place], [phone]

## 2012-08-20 NOTE — Op Note (Signed)
[facility] [address] [place], [address]   COLONOSCOPY PROCEDURE REPORT  PATIENT: Timo, Hartwig  MR#: [id number] BIRTHDATE: 1928-05-27 , 84  yrs. old GENDER: Male ENDOSCOPIST: Carman Ching, MD REFERRED BY:   [facility] PROCEDURE DATE:  08/20/2012 PROCEDURE:   colonoscopy ASA CLASS:   class III INDICATIONS:  subacute G.I. bleeding from gentleman on Coumadin MEDICATIONS:    fentanyl 25 mcg, versed 1 mg IV  DESCRIPTION OF PROCEDURE: The Pentax adult colonoscope was inserted: digital rectal exam after the EGD had been complete. The prep was fairly good and were able to reach the cecum. Appendiceal orifice ileocecal valve were seen. Scope was withdrawn in the mucosa carefully exam. There was no active bleeding were signs of any recently throughout the colon. No polyps or masses were seen and there was no sign of AVMs. Extensive diverticular disease of the left: without active bleeding. In the retroflex view the patient was seen to have moderate internal hemorrhoids. The scope was withdrawn in the patient tolerated procedure well.  please see attached images   COMPLICATIONS: None  ENDOSCOPIC IMPRESSION: 1. Extensive diverticular disease of the left colon. There is no sign of active bleeding 2. Subacute G.I. bleeding without clear site discovered on colonoscopy 3. Internal hemorrhoids  RECOMMENDATIONS: follow patient clinically with decision to be made in the future about whether or not to restart anticoagulation    _______________________________ eSigned:  Carman Ching, MD 08/20/2012 2:32 PM

## 2012-08-20 NOTE — ED Provider Notes (Signed)
I saw and evaluated the patient, reviewed the resident's note and I agree with the findings and plan.   Candyce Churn, MD 08/20/12 [phone]

## 2012-08-21 DIAGNOSIS — N182 Chronic kidney disease, stage 2 (mild): Secondary | ICD-10-CM

## 2012-08-21 LAB — CBC
Hemoglobin: 9.6 g/dL — ABNORMAL LOW (ref 13.0–17.0)
MCHC: 33.2 g/dL (ref 30.0–36.0)
MCHC: 33 g/dL (ref 30.0–36.0)
MCV: 83.9 fL (ref 78.0–100.0)
Platelets: 254 10*3/uL (ref 150–400)
Platelets: 259 10*3/uL (ref 150–400)
RBC: 3.41 MIL/uL — ABNORMAL LOW (ref 4.22–5.81)
RDW: 14.4 % (ref 11.5–15.5)
WBC: 6.3 10*3/uL (ref 4.0–10.5)

## 2012-08-21 NOTE — Progress Notes (Signed)
PATIENT DETAILS Name: Trevor Webb Age: 79 y.o. Sex: male Date of Birth: 05-Jun-1928 Admit Date: 08/18/2012 Admitting Physician No admitting provider for patient encounter. ZOX:WRUEAV, JERRY, MD  Subjective: No bleeding overnight.  Assessment/Plan: SubacuteGI bleed  -supected Diverticular in etiology  - This is his second episode of diverticular bleed-suspect it is time to permanently stop coumadin and just place on ASA. I had a long discussion with the patient's son-Trevor Webb-explained that for the immediate future patient will be discontinued off Coumadin. Given the fact that this is his second episode of GI bleed, I suspect we may no longer be able to continue Coumadin. Trevor Webb was very understanding, he is aware that, in the next 1-2 weeks we could perhaps start aspirin, he is also aware that aspirin would not provide the same amount of cardio-an embolic prophylaxis as compared to Coumadin. He is accepting and is agreeable with this strategy.  - GI was consulted, endoscopy and colonoscopy were done. Endoscopy did not demonstrate any major abnormalities, colonoscopy did not reveal any evidence of acute blood loss, however there were significant amount of diverticulosis. This time it is presumed that GI bleed was from diverticulosis.   Acute blood loss  - Patient's hemoglobin is stable after 4 units of PRBC transfusion. Please recheck hemoglobin and hematocrit in one week.  - Acute blood loss was from above noted etiology   Coagulopathy  - This is secondary to Coumadin therapy. This has been discontinued. Patient was given one dose of vitamin K on admission. Please see above regarding Coumadin discussion   Acute renal failure  - This is secondary to blood loss, this has resolved following IV fluids and PRBC transfusion.   Hypertension  -BP Controlled  -c/w coreg, lasix, Imdur and ramipril   Chronic Diastolic CHF  -stable and euvolemic during this admission   BPH  -c/w Flomax    CAD-s/p CABG  -no ASA at this point, consider resuming in the next 1-2 weeks  -monitor   Hx of CVA  -stable  -not on ASA at present-non focal exam  -Start ASA when able - suggest in the next 1-2 week  - Coumadin discontinued - given GI bleed and severe anemia   History of paroxysmal atrial fibrillation  - Please see above regarding Coumadin discussion  - Otherwise stable this admission  - Restart aspirin next 1-2 weeks for cardioembolic prophylaxis.   Seizure Disorder  -c/w Keppra   Dementia  -at baseline  -c/w Aricept   Disposition: Remain inpatient-back to ALF when bed available  DVT Prophylaxis: r SCD's  Code Status: DNR  Family Communication Son-Trevor Webb-left msg today  Procedures:  EGD/Colonoscopy 8/1  CONSULTS:  GI   MEDICATIONS: Scheduled Meds: . antiseptic oral rinse  15 mL Mouth Rinse q12n4p  . azelastine  2 spray Each Nare BID  . carvedilol  3.125 mg Oral BID WC  . chlorhexidine  15 mL Mouth Rinse BID  . cyanocobalamin  1,000 mcg Intramuscular Q30 days  . donepezil  10 mg Oral QHS  . furosemide  40 mg Oral Daily  . isosorbide mononitrate  15 mg Oral Daily  . levETIRAcetam  500 mg Oral BID  . loratadine  10 mg Oral Daily  . pantoprazole (PROTONIX) IV  40 mg Intravenous Q12H  . ramipril  5 mg Oral BID  . sertraline  25 mg Oral Daily  . sodium chloride  3 mL Intravenous Q12H  . tamsulosin  0.4 mg Oral QPC breakfast   Continuous Infusions:  PRN  Meds:.nitroGLYCERIN, ondansetron (ZOFRAN) IV, ondansetron  Antibiotics: Anti-infectives   None       PHYSICAL EXAM: Vital signs in last 24 hours: Filed Vitals:   08/20/12 1613 08/20/12 2116 08/21/12 0756 08/21/12 1017  BP: 138/75 136/80  136/64  Pulse: 50 54 58   Temp:  97.5 F (36.4 C)    TempSrc:  Oral    Resp:  18    Height:      Weight:      SpO2:  94%      Weight change:  Filed Weights   08/19/12 2202  Weight: 73.5 kg (162 lb 0.6 oz)   Body mass index is 23.92 kg/(m^2).    Gen Exam: Awake with clear speech.   Neck: Supple, No JVD.   Chest: B/L Clear.   CVS: S1 S2 Regular, no murmurs.  Abdomen: soft, BS +, non tender, non distended.  Extremities: no edema, lower extremities warm to touch. Neurologic: Non Focal.  Skin: No Rash.   Wounds: N/A.    Intake/Output from previous day:  Intake/Output Summary (Last 24 hours) at 08/21/12 1212 Last data filed at 08/20/12 1818  Gross per 24 hour  Intake    358 ml  Output   1400 ml  Net  -1042 ml     LAB RESULTS: CBC  Recent Labs Lab 08/18/12 1527 08/19/12 0127 08/19/12 1141 08/19/12 2340 08/20/12 0930 08/20/12 1832 08/20/12 2331 08/21/12 0806  WBC 9.8 8.4 7.5  --  6.4 7.3  --  6.3  HGB 5.5* 6.7* 9.0* 9.3* 9.7* 10.5* 9.5* 10.2*  HCT 17.2* 19.8* 26.9* 27.8* 29.1* 30.9* 28.3* 30.7*  PLT PLATELET CLUMPS NOTED ON SMEAR, UNABLE TO ESTIMATE 268 246  --  257 263  --  254  MCV 83.9 82.5 82.0  --  83.4 83.5  --  83.9  MCH 26.8 27.9 27.4  --  27.8 28.4  --  27.9  MCHC 32.0 33.8 33.5  --  33.3 34.0  --  33.2  RDW 15.0 14.6 14.6  --  14.6 14.4  --  14.4  LYMPHSABS 1.4 1.4  --   --   --   --   --   --   MONOABS 0.9 0.9  --   --   --   --   --   --   EOSABS 0.2 0.2  --   --   --   --   --   --   BASOSABS 0.1 0.1  --   --   --   --   --   --     Chemistries   Recent Labs Lab 08/18/12 1527 08/20/12 1218  NA 137 142  K 4.3 3.6  CL 102 107  CO2 21 27  GLUCOSE 106* 84  BUN 46* 23  CREATININE 1.76* 1.27  CALCIUM 8.4 8.1*    CBG: No results found for this basename: GLUCAP,  in the last 168 hours  GFR Estimated Creatinine Clearance: 44.8 ml/min (by C-G formula based on Cr of 1.27).  Coagulation profile  Recent Labs Lab 08/18/12 1611 08/19/12 0127 08/20/12 1218  INR 3.07* 3.07* 1.34    Cardiac Enzymes No results found for this basename: CK, CKMB, TROPONINI, MYOGLOBIN,  in the last 168 hours  No components found with this basename: POCBNP,  No results found for this basename: DDIMER,   in the last 72 hours No results found for this basename: HGBA1C,  in the last 72 hours No results found for  this basename: CHOL, HDL, LDLCALC, TRIG, CHOLHDL, LDLDIRECT,  in the last 72 hours No results found for this basename: TSH, T4TOTAL, FREET3, T3FREE, THYROIDAB,  in the last 72 hours No results found for this basename: VITAMINB12, FOLATE, FERRITIN, TIBC, IRON, RETICCTPCT,  in the last 72 hours  Recent Labs  08/18/12 1527  LIPASE 32    Urine Studies No results found for this basename: UACOL, UAPR, USPG, UPH, UTP, UGL, UKET, UBIL, UHGB, UNIT, UROB, ULEU, UEPI, UWBC, URBC, UBAC, CAST, CRYS, UCOM, BILUA,  in the last 72 hours  MICROBIOLOGY: Recent Results (from the past 240 hour(s))  MRSA PCR SCREENING     Status: None   Collection Time    08/19/12 12:01 AM      Result Value Range Status   MRSA by PCR NEGATIVE  NEGATIVE Final   Comment:            The GeneXpert MRSA Assay (FDA     approved for NASAL specimens     only), is one component of a     comprehensive MRSA colonization     surveillance program. It is not     intended to diagnose MRSA     infection nor to guide or     monitor treatment for     MRSA infections.    RADIOLOGY STUDIES/RESULTS: Dg Chest 2 View  08/18/2012   *RADIOLOGY REPORT*  Clinical Data: Nausea  CHEST - 2 VIEW  Comparison: March 18, 2011.  Findings: Status post coronary artery bypass graft.  Stable cardiomegaly.  Right lung is clear.  Interval development small linear density in lingular region most consistent with subsegmental atelectasis.  No pleural effusion or pneumothorax is noted.  IMPRESSION: Minimal lingular subsegmental atelectasis.  Postoperative changes as described above.   Original Report Authenticated By: Lupita Raider.,  M.D.    Jeoffrey Massed, MD  [facility] Pager:[phone]  If 7PM-7AM, please contact night-coverage [email or web address] Password [email or web address] 08/21/2012, 12:12 PM   LOS: 3 days

## 2012-08-21 NOTE — Progress Notes (Signed)
[facility] PROGRESS NOTE Subjective Tolerating diet no complaints. Hg stable  For discharge today or tomorrow.  Objective: Vital signs in last 24 hours: Temp:  [97.5 F (36.4 C)-98.1 F (36.7 C)] 97.5 F (36.4 C) (08/01 2116) Pulse Rate:  [50-58] 58 (08/02 0756) Resp:  [13-21] 18 (08/01 2116) BP: (127-162)/(62-87) 136/64 mmHg (08/02 1017) SpO2:  [94 %-100 %] 94 % (08/01 2116) Last BM Date: 08/19/12  Intake/Output from previous day: 08/01 0701 - 08/02 0700 In: 658 [P.O.:658] Out: 1400 [Urine:1400] Intake/Output this shift:    PE: General--NAD Heart-- Lungs-- Abdomen--soft nontender  Lab Results:  Recent Labs  08/19/12 0127 08/19/12 1141 08/19/12 2340 08/20/12 0930 08/20/12 1832 08/20/12 2331 08/21/12 0806  WBC 8.4 7.5  --  6.4 7.3  --  6.3  HGB 6.7* 9.0* 9.3* 9.7* 10.5* 9.5* 10.2*  HCT 19.8* 26.9* 27.8* 29.1* 30.9* 28.3* 30.7*  PLT 268 246  --  257 263  --  254   BMET  Recent Labs  08/18/12 1527 08/20/12 1218  NA 137 142  K 4.3 3.6  CL 102 107  CO2 21 27  CREATININE 1.76* 1.27   LFT  Recent Labs  08/18/12 1527  PROT 6.0  AST 8  ALT 7  ALKPHOS 95  BILITOT 0.3   PT/INR  Recent Labs  08/18/12 1611 08/19/12 0127 08/20/12 1218  LABPROT 30.6* 30.6* 16.3*  INR 3.07* 3.07* 1.34   PANCREAS  Recent Labs  08/18/12 1527  LIPASE 32         Studies/Results: No results found.  Medications: I have reviewed the patient's current medications.  Assessment/Plan: 1. GI Bleed. EGD and colon negative for active bleeding + diverticulosis. Agree with discharge and f/u with PCP. Will have to decide if needs to be on coumadin or anti platelet agents   Aniken Monestime JR,Cynthya Yam L 08/21/2012, 12:04 PM

## 2012-08-21 NOTE — Discharge Summary (Addendum)
PATIENT DETAILS Name: Trevor Webb Age: 79 y.o. Sex: male Date of Birth: [birth year] MRN: [id number]. Admit Date: 08/18/2012 Admitting Physician: Eddie North, MD WUJ:[id number], Dorene Sorrow, MD  Recommendations for Outpatient Follow-up:  1. Please repeat CBC in one week 2. Please resume aspirin 1-2 weeks, Coumadin has been discontinued. Please reassess risks vs benefits of resuming further anticoagulation in the future, this is his second episode of GI bleed. 3. Contact precautions, keep suspected shingles area code at all time. Continue contact precautions till all lesions have crusted. Strict handwashing 4. Please have patient primary care practitioner see the patient in the next 2 or 3 days.  PRIMARY DISCHARGE DIAGNOSIS:  Principal Problem:   GI bleed Active Problems:   Diverticulosis   Shingles in approximately T8 dermatome   Hypertension   CHF, left ventricular   Paroxysmal atrial fibrillation   Stroke   Dementia   Chronic anticoagulation   CKD (chronic kidney disease), stage II   Anemia-acute blood loss anemia   Small bowel diverticular disease      PAST MEDICAL HISTORY: Past Medical History  Diagnosis Date  . Hypertension   . CHF, left ventricular   . Paroxysmal atrial fibrillation   . CHF (congestive heart failure)   . Angina   . Myocardial infarction 2009  . Stroke 2011    residual:  "right sided weakness; problems walking; dementia"  . Dementia     S/P CVA 2011  . Arthritis   . Dysphasia   . A-fib   . GERD (gastroesophageal reflux disease)   . Alzheimer's dementia   . Seizures     epilepsy  . Diverticulitis   . Abdominal abscess     DISCHARGE MEDICATIONS:   Medication List    STOP taking these medications       warfarin 1 MG tablet  Commonly known as:  COUMADIN     warfarin 3 MG tablet  Commonly known as:  COUMADIN      TAKE these medications       acetaminophen 325 MG tablet  Commonly known as:  TYLENOL  Take 2 tablets (650 mg total) by  mouth every 6 (six) hours as needed for pain.     aspirin EC 81 MG tablet  Take 1 tablet (81 mg total) by mouth daily.     azelastine 137 MCG/SPRAY nasal spray  Commonly known as:  ASTELIN  Place 2 sprays into the nose 2 (two) times daily.     carvedilol 3.125 MG tablet  Commonly known as:  COREG  Take 3.125 mg by mouth 2 (two) times daily with a meal.     cyanocobalamin 1000 MCG/ML injection  Commonly known as:  (VITAMIN B-12)  Inject 1,000 mcg into the muscle every 30 (thirty) days. Give on the 24th of each month     donepezil 10 MG tablet  Commonly known as:  ARICEPT  Take 10 mg by mouth at bedtime.     furosemide 40 MG tablet  Commonly known as:  LASIX  Take 40 mg by mouth daily.     isosorbide mononitrate 30 MG 24 hr tablet  Commonly known as:  IMDUR  Take 15 mg by mouth daily.     levETIRAcetam 500 MG tablet  Commonly known as:  KEPPRA  Take 500 mg by mouth 2 (two) times daily.     loratadine 10 MG tablet  Commonly known as:  CLARITIN  Take 10 mg by mouth daily.     nitroGLYCERIN 0.4 MG SL  tablet  Commonly known as:  NITROSTAT  Place 0.4 mg under the tongue every 5 (five) minutes as needed for chest pain.     pantoprazole 40 MG tablet  Commonly known as:  PROTONIX  Take 1 tablet (40 mg total) by mouth daily.     polyethylene glycol packet  Commonly known as:  MIRALAX / GLYCOLAX  Take 17 g by mouth daily.     ramipril 5 MG capsule  Commonly known as:  ALTACE  Take 5 mg by mouth 2 (two) times daily.     sertraline 25 MG tablet  Commonly known as:  ZOLOFT  Take 25 mg by mouth daily.     tamsulosin 0.4 MG Caps capsule  Commonly known as:  FLOMAX  Take 0.4 mg by mouth daily after breakfast.     traMADol 50 MG tablet  Commonly known as:  ULTRAM  Take 1 tablet (50 mg total) by mouth every 6 (six) hours as needed.     valACYclovir 1000 MG tablet  Commonly known as:  VALTREX  Take 1 tablet (1,000 mg total) by mouth 3 (three) times daily. For 7 more days  from 08/23/12        ALLERGIES:   Allergies  Allergen Reactions  . Morphine And Related     Delusions    BRIEF HPI:  See H&P, Labs, Consult and Test reports for all details in brief, patient is a 79 year old gentleman with a history of coronary artery disease status post CABG, CHF, CVA, A. fib on chronic Coumadin therapy, prior history of GI bleed, moderate dementia who was brought to the hospital for weakness. He was found to have a hemoglobin of 5.5, he was then admitted to the hospitalist service for further evaluation and treatment.  CONSULTATIONS:   GI  PERTINENT RADIOLOGIC STUDIES: Dg Chest 2 View  08/18/2012   *RADIOLOGY REPORT*  Clinical Data: Nausea  CHEST - 2 VIEW  Comparison: March 18, 2011.  Findings: Status post coronary artery bypass graft.  Stable cardiomegaly.  Right lung is clear.  Interval development small linear density in lingular region most consistent with subsegmental atelectasis.  No pleural effusion or pneumothorax is noted.  IMPRESSION: Minimal lingular subsegmental atelectasis.  Postoperative changes as described above.   Original Report Authenticated By: Lupita Raider.,  M.D.     PERTINENT LAB RESULTS: CBC:  Recent Labs  08/22/12 1902 08/23/12 0815  WBC 10.0 11.7*  HGB 9.7* 10.2*  HCT 29.1* 31.6*  PLT 272 264   CMET CMP     Component Value Date/Time   NA 142 08/20/2012 1218   K 3.6 08/20/2012 1218   CL 107 08/20/2012 1218   CO2 27 08/20/2012 1218   GLUCOSE 84 08/20/2012 1218   BUN 23 08/20/2012 1218   CREATININE 1.27 08/20/2012 1218   CALCIUM 8.1* 08/20/2012 1218   PROT 6.0 08/18/2012 1527   ALBUMIN 3.0* 08/18/2012 1527   AST 8 08/18/2012 1527   ALT 7 08/18/2012 1527   ALKPHOS 95 08/18/2012 1527   BILITOT 0.3 08/18/2012 1527   GFRNONAA 51* 08/20/2012 1218   GFRAA 59* 08/20/2012 1218    GFR Estimated Creatinine Clearance: 44.8 ml/min (by C-G formula based on Cr of 1.27). No results found for this basename: LIPASE, AMYLASE,  in the last 72 hours No  results found for this basename: CKTOTAL, CKMB, CKMBINDEX, TROPONINI,  in the last 72 hours No components found with this basename: POCBNP,  No results found for this basename: DDIMER,  in the last 72 hours No results found for this basename: HGBA1C,  in the last 72 hours No results found for this basename: CHOL, HDL, LDLCALC, TRIG, CHOLHDL, LDLDIRECT,  in the last 72 hours No results found for this basename: TSH, T4TOTAL, FREET3, T3FREE, THYROIDAB,  in the last 72 hours No results found for this basename: VITAMINB12, FOLATE, FERRITIN, TIBC, IRON, RETICCTPCT,  in the last 72 hours Coags: No results found for this basename: PT, INR,  in the last 72 hours Microbiology: Recent Results (from the past 240 hour(s))  MRSA PCR SCREENING     Status: None   Collection Time    08/19/12 12:01 AM      Result Value Range Status   MRSA by PCR NEGATIVE  NEGATIVE Final   Comment:            The GeneXpert MRSA Assay (FDA     approved for NASAL specimens     only), is one component of a     comprehensive MRSA colonization     surveillance program. It is not     intended to diagnose MRSA     infection nor to guide or     monitor treatment for     MRSA infections.     BRIEF HOSPITAL COURSE:   SubacuteGI bleed  -supected Diverticular in etiology - This is his second episode of diverticular bleed-suspect it is time to permanently stop coumadin and just place on ASA. I had a long discussion with the patient's son-carter-explained that for the immediate future patient will be discontinued off Coumadin. Given the fact that this is his second episode of GI bleed, I suspect we may no longer be able to continue Coumadin. [name] was very understanding, he is aware that, in the next 1-2 weeks we could perhaps start aspirin, he is also aware that aspirin would not provide the same amount of cardio-an embolic prophylaxis as compared to Coumadin. He is accepting and is agreeable with this strategy. Aspirin will be  started on discharge. - GI was consulted, endoscopy and colonoscopy were done. Endoscopy did not demonstrate any major abnormalities, colonoscopy did not reveal any evidence of acute blood loss, however there were significant amount of diverticulosis. This time it is presumed that GI bleed was from diverticulosis.  Acute blood loss - Patient's hemoglobin is stable after 4 units of PRBC transfusion. Please recheck hemoglobin and hematocrit in one week. - Acute blood loss was from above noted etiology  Coagulopathy - This is secondary to Coumadin therapy. This has been discontinued. Patient was given one dose of vitamin K on admission. Please see above regarding Coumadin discussion  Acute renal failure - This is secondary to blood loss, this has resolved following IV fluids and PRBC transfusion.  Hypertension  -BP Controlled  -c/w coreg, lasix, Imdur and ramipril   Chronic Diastolic CHF  -stable and euvolemic during this admission  BPH  -c/w Flomax   CAD-s/p CABG  -no ASA at this point, consider resuming in the next 1-2 weeks -monitor   Hx of CVA  -stable  -not on ASA at present-non focal exam , we'll start aspirin on discharge - Coumadin discontinued - given GI bleed and severe anemia   History of paroxysmal atrial fibrillation - Please see above regarding Coumadin discussion - Otherwise stable this admission - Restart aspirin for cardioembolic prophylaxis.  Presumed shingles - Patient in the past 2 days, developed a painful erythematous rash in his T 8 dermatome area, some of  them have now started to form blisters. Suspect shingles early stages. Have started Valtrex on 8/3, continue for 7 more days on discharge. - Patient will need to be on contact precautions while at the ALF. Strict handwashing. Need to cover the affected area at all time to prevent her from sleeping. Patient will need strict contact precautions to all lesions have crusted.  Seizure Disorder  -c/w Keppra    Dementia  -at baseline  -c/w Aricept  TODAY-DAY OF DISCHARGE:  Subjective:   Lovey Newcomer today has no headache,no chest abdominal pain,no new weakness tingling or numbness. Hemoglobin remained stable, no active GI bleeding seen. He is stable to be discharged back to his facility.  Objective:   Blood pressure 132/91, pulse 66, temperature 100.4 F (38 C), temperature source Oral, resp. rate 18, height 5\' 9"  (1.753 m), weight 73.5 kg (162 lb 0.6 oz), SpO2 97.00%.  Intake/Output Summary (Last 24 hours) at 08/23/12 1231 Last data filed at 08/22/12 2256  Gross per 24 hour  Intake    243 ml  Output   1000 ml  Net   -757 ml   Filed Weights   08/19/12 2202  Weight: 73.5 kg (162 lb 0.6 oz)    Exam Awake confused at baseline, No new F.N deficits, Normal affect [place].AT,PERRAL Supple Neck,No JVD, No cervical lymphadenopathy appriciated.  Symmetrical Chest wall movement, Good air movement bilaterally, CTAB RRR,No Gallops,Rubs or new Murmurs, No Parasternal Heave +ve B.Sounds, Abd Soft, Non tender, No organomegaly appriciated, No rebound -guarding or rigidity. No Cyanosis, Clubbing or edema, No new Rash or bruise Presumed shingles in the T8 dermatome area-essentially unchanged-some lesions seem to have started to convert into a vesicle  DISCHARGE CONDITION: Stable  DISPOSITION: ALF- please see above regarding contact precautions  DISCHARGE INSTRUCTIONS:    Activity:  As tolerated with Full fall precautions use walker/cane & assistance as needed  Diet recommendation: Heart Healthy diet   Discharge Orders   Future Orders Complete By Expires     Call MD for:  extreme fatigue  As directed     Diet - low sodium heart healthy  As directed     Increase activity slowly  As directed        Follow-up Information   Follow up with Darlina Guys, MD. Schedule an appointment as soon as possible for a visit in 3 days.   Contact information:   [address] [place]  [address] [phone]       Schedule an appointment as soon as possible for a visit with EDWARDS JR,JAMES L, MD. (As needed)    Contact information:   [address]                         [place] [address] [phone]         Total Time spent on discharge equals 45 minutes.  SignedJeoffrey Massed 08/23/2012 12:31 PM

## 2012-08-22 DIAGNOSIS — I10 Essential (primary) hypertension: Secondary | ICD-10-CM

## 2012-08-22 LAB — TYPE AND SCREEN
Unit division: 0
Unit division: 0
Unit division: 0
Unit division: 0

## 2012-08-22 LAB — CBC
HCT: 29.1 % — ABNORMAL LOW (ref 39.0–52.0)
Hemoglobin: 9.7 g/dL — ABNORMAL LOW (ref 13.0–17.0)
MCHC: 33.3 g/dL (ref 30.0–36.0)
MCV: 84.3 fL (ref 78.0–100.0)
Platelets: 247 10*3/uL (ref 150–400)
RBC: 3.38 MIL/uL — ABNORMAL LOW (ref 4.22–5.81)
RBC: 3.45 MIL/uL — ABNORMAL LOW (ref 4.22–5.81)
WBC: 7.6 10*3/uL (ref 4.0–10.5)

## 2012-08-22 MED ORDER — ACETAMINOPHEN 325 MG PO TABS
650.0000 mg | ORAL_TABLET | Freq: Four times a day (QID) | ORAL | Status: DC | PRN
  Administered 2012-08-23 – 2012-08-26 (×4): 650 mg via ORAL
  Filled 2012-08-22 (×4): qty 2

## 2012-08-22 MED ORDER — PANTOPRAZOLE SODIUM 40 MG PO TBEC
40.0000 mg | DELAYED_RELEASE_TABLET | Freq: Two times a day (BID) | ORAL | Status: DC
  Administered 2012-08-22 – 2012-08-27 (×11): 40 mg via ORAL
  Filled 2012-08-22 (×10): qty 1

## 2012-08-22 MED ORDER — TRAMADOL HCL 50 MG PO TABS: 50.0000 mg | ORAL_TABLET | Freq: Four times a day (QID) | ORAL | Status: DC | PRN

## 2012-08-22 MED ORDER — VALACYCLOVIR HCL 500 MG PO TABS
1000.0000 mg | ORAL_TABLET | Freq: Three times a day (TID) | ORAL | Status: DC
  Administered 2012-08-22 – 2012-08-27 (×16): 1000 mg via ORAL
  Filled 2012-08-22 (×21): qty 2

## 2012-08-22 NOTE — Progress Notes (Signed)
Asleep with easy respirations; VSS

## 2012-08-22 NOTE — Progress Notes (Signed)
PATIENT DETAILS Name: Trevor Webb Age: 79 y.o. Sex: male Date of Birth: 11-Apr-1928 Admit Date: 08/18/2012 Admitting Physician No admitting provider for patient encounter. ZOX:WRUEAV, JERRY, MD  Subjective: No bleeding overnight. Pain in that it chest area-erythematous and? Early shingles  Assessment/Plan: SubacuteGI bleed  -supected Diverticular in etiology  - This is his second episode of diverticular bleed-suspect it is time to permanently stop coumadin and just place on ASA. I had a long discussion with the patient's son-carter-explained that for the immediate future patient will be discontinued off Coumadin. Given the fact that this is his second episode of GI bleed, I suspect we may no longer be able to continue Coumadin. [name] was very understanding, he is aware that, in the next 1-2 weeks we could perhaps start aspirin, he is also aware that aspirin would not provide the same amount of cardio-an embolic prophylaxis as compared to Coumadin. He is accepting and is agreeable with this strategy.  - GI was consulted, endoscopy and colonoscopy were done. Endoscopy did not demonstrate any major abnormalities, colonoscopy did not reveal any evidence of acute blood loss, however there were significant amount of diverticulosis. This time it is presumed that GI bleed was from diverticulosis.   Acute blood loss  - Patient's hemoglobin is stable after 4 units of PRBC transfusion. Please recheck hemoglobin and hematocrit in one week.  - Acute blood loss was from above noted etiology   Coagulopathy  - This is secondary to Coumadin therapy. This has been discontinued. Patient was given one dose of vitamin K on admission. Please see above regarding Coumadin discussion   Acute renal failure  - This is secondary to blood loss, this has resolved following IV fluids and PRBC transfusion.   Possible shingles in the right chest wall area - Start Valtrex, place on isolation  Hypertension  -BP  Controlled  -c/w coreg, lasix, Imdur and ramipril   Chronic Diastolic CHF  -stable and euvolemic during this admission   BPH  -c/w Flomax   CAD-s/p CABG  -no ASA at this point, consider resuming in the next 1-2 weeks  -monitor   Hx of CVA  -stable  -not on ASA at present-non focal exam  -Start ASA when able - suggest in the next 1-2 week  - Coumadin discontinued - given GI bleed and severe anemia   History of paroxysmal atrial fibrillation  - Please see above regarding Coumadin discussion  - Otherwise stable this admission  - Restart aspirin next 1-2 weeks for cardioembolic prophylaxis.   Seizure Disorder  -c/w Keppra   Dementia  -at baseline  -c/w Aricept   Disposition: Remain inpatient-back to ALF when bed available  DVT Prophylaxis: r SCD's  Code Status: DNR  Family Communication Son-[name]-left msg today  Procedures:  EGD/Colonoscopy 8/1  CONSULTS:  GI   MEDICATIONS: Scheduled Meds: . antiseptic oral rinse  15 mL Mouth Rinse q12n4p  . azelastine  2 spray Each Nare BID  . carvedilol  3.125 mg Oral BID WC  . chlorhexidine  15 mL Mouth Rinse BID  . cyanocobalamin  1,000 mcg Intramuscular Q30 days  . donepezil  10 mg Oral QHS  . furosemide  40 mg Oral Daily  . isosorbide mononitrate  15 mg Oral Daily  . levETIRAcetam  500 mg Oral BID  . loratadine  10 mg Oral Daily  . pantoprazole  40 mg Oral BID  . ramipril  5 mg Oral BID  . sertraline  25 mg Oral Daily  .  sodium chloride  3 mL Intravenous Q12H  . tamsulosin  0.4 mg Oral QPC breakfast   Continuous Infusions:  PRN Meds:.nitroGLYCERIN, ondansetron (ZOFRAN) IV, ondansetron  Antibiotics: Anti-infectives   None       PHYSICAL EXAM: Vital signs in last 24 hours: Filed Vitals:   08/21/12 2105 08/22/12 0516 08/22/12 0726 08/22/12 0922  BP: 137/64 159/86  114/69  Pulse: 86 65 85   Temp: 98.6 F (37 C) 97.7 F (36.5 C)    TempSrc: Oral Tympanic    Resp: 18 18    Height:      Weight:       SpO2: 100% 97%      Weight change:  Filed Weights   08/19/12 2202  Weight: 73.5 kg (162 lb 0.6 oz)   Body mass index is 23.92 kg/(m^2).   Gen Exam: Awake with clear speech.   Neck: Supple, No JVD.   Chest: B/L Clear.   CVS: S1 S2 Regular, no murmurs.  Abdomen: soft, BS +, non tender, non distended.  Extremities: no edema, lower extremities warm to touch. Neurologic: Non Focal.  Skin: ? Developing shingles in the right chest wall area  Wounds: N/A.    Intake/Output from previous day:  Intake/Output Summary (Last 24 hours) at 08/22/12 1138 Last data filed at 08/22/12 0844  Gross per 24 hour  Intake     63 ml  Output   1700 ml  Net  -1637 ml     LAB RESULTS: CBC  Recent Labs Lab 08/18/12 1527 08/19/12 0127  08/20/12 0930 08/20/12 1832 08/20/12 2331 08/21/12 0806 08/21/12 2010 08/22/12 0849  WBC 9.8 8.4  < > 6.4 7.3  --  6.3 7.1 7.6  HGB 5.5* 6.7*  < > 9.7* 10.5* 9.5* 10.2* 9.6* 9.4*  HCT 17.2* 19.8*  < > 29.1* 30.9* 28.3* 30.7* 29.1* 28.5*  PLT PLATELET CLUMPS NOTED ON SMEAR, UNABLE TO ESTIMATE 268  < > 257 263  --  254 259 247  MCV 83.9 82.5  < > 83.4 83.5  --  83.9 85.3 84.3  MCH 26.8 27.9  < > 27.8 28.4  --  27.9 28.2 27.8  MCHC 32.0 33.8  < > 33.3 34.0  --  33.2 33.0 33.0  RDW 15.0 14.6  < > 14.6 14.4  --  14.4 14.6 14.4  LYMPHSABS 1.4 1.4  --   --   --   --   --   --   --   MONOABS 0.9 0.9  --   --   --   --   --   --   --   EOSABS 0.2 0.2  --   --   --   --   --   --   --   BASOSABS 0.1 0.1  --   --   --   --   --   --   --   < > = values in this interval not displayed.  Chemistries   Recent Labs Lab 08/18/12 1527 08/20/12 1218  NA 137 142  K 4.3 3.6  CL 102 107  CO2 21 27  GLUCOSE 106* 84  BUN 46* 23  CREATININE 1.76* 1.27  CALCIUM 8.4 8.1*    CBG: No results found for this basename: GLUCAP,  in the last 168 hours  GFR Estimated Creatinine Clearance: 44.8 ml/min (by C-G formula based on Cr of 1.27).  Coagulation profile  Recent  Labs Lab 08/18/12 1611 08/19/12 0127 08/20/12 1218  INR 3.07* 3.07* 1.34    Cardiac Enzymes No results found for this basename: CK, CKMB, TROPONINI, MYOGLOBIN,  in the last 168 hours  No components found with this basename: POCBNP,  No results found for this basename: DDIMER,  in the last 72 hours No results found for this basename: HGBA1C,  in the last 72 hours No results found for this basename: CHOL, HDL, LDLCALC, TRIG, CHOLHDL, LDLDIRECT,  in the last 72 hours No results found for this basename: TSH, T4TOTAL, FREET3, T3FREE, THYROIDAB,  in the last 72 hours No results found for this basename: VITAMINB12, FOLATE, FERRITIN, TIBC, IRON, RETICCTPCT,  in the last 72 hours No results found for this basename: LIPASE, AMYLASE,  in the last 72 hours  Urine Studies No results found for this basename: UACOL, UAPR, USPG, UPH, UTP, UGL, UKET, UBIL, UHGB, UNIT, UROB, ULEU, UEPI, UWBC, URBC, UBAC, CAST, CRYS, UCOM, BILUA,  in the last 72 hours  MICROBIOLOGY: Recent Results (from the past 240 hour(s))  MRSA PCR SCREENING     Status: None   Collection Time    08/19/12 12:01 AM      Result Value Range Status   MRSA by PCR NEGATIVE  NEGATIVE Final   Comment:            The GeneXpert MRSA Assay (FDA     approved for NASAL specimens     only), is one component of a     comprehensive MRSA colonization     surveillance program. It is not     intended to diagnose MRSA     infection nor to guide or     monitor treatment for     MRSA infections.    RADIOLOGY STUDIES/RESULTS: Dg Chest 2 View  08/18/2012   *RADIOLOGY REPORT*  Clinical Data: Nausea  CHEST - 2 VIEW  Comparison: March 18, 2011.  Findings: Status post coronary artery bypass graft.  Stable cardiomegaly.  Right lung is clear.  Interval development small linear density in lingular region most consistent with subsegmental atelectasis.  No pleural effusion or pneumothorax is noted.  IMPRESSION: Minimal lingular subsegmental atelectasis.   Postoperative changes as described above.   Original Report Authenticated By: Lupita Raider.,  M.D.    Jeoffrey Massed, MD  [facility] Pager:[phone]  If 7PM-7AM, please contact night-coverage [email or web address] Password [email or web address] 08/22/2012, 11:38 AM   LOS: 4 days

## 2012-08-23 ENCOUNTER — Encounter (HOSPITAL_COMMUNITY): Payer: Self-pay | Admitting: Gastroenterology

## 2012-08-23 LAB — CBC
HCT: 31.6 % — ABNORMAL LOW (ref 39.0–52.0)
HCT: 29.5 % — ABNORMAL LOW (ref 39.0–52.0)
Hemoglobin: 10.2 g/dL — ABNORMAL LOW (ref 13.0–17.0)
Hemoglobin: 9.6 g/dL — ABNORMAL LOW (ref 13.0–17.0)
MCH: 27.3 pg (ref 26.0–34.0)
MCH: 27.6 pg (ref 26.0–34.0)
MCHC: 32.5 g/dL (ref 30.0–36.0)
MCV: 84.7 fL (ref 78.0–100.0)
MCV: 84.8 fL (ref 78.0–100.0)
RBC: 3.73 MIL/uL — ABNORMAL LOW (ref 4.22–5.81)
RDW: 14.6 % (ref 11.5–15.5)
WBC: 11.7 10*3/uL — ABNORMAL HIGH (ref 4.0–10.5)

## 2012-08-23 MED ORDER — ASPIRIN EC 81 MG PO TBEC: 81.0000 mg | DELAYED_RELEASE_TABLET | Freq: Every day | ORAL | Status: DC

## 2012-08-23 MED ORDER — ACETAMINOPHEN 325 MG PO TABS: 650.0000 mg | ORAL_TABLET | Freq: Four times a day (QID) | ORAL | Status: AC | PRN

## 2012-08-23 MED ORDER — TRAMADOL HCL 50 MG PO TABS: 50.0000 mg | ORAL_TABLET | Freq: Four times a day (QID) | ORAL | Status: DC | PRN

## 2012-08-23 MED ORDER — VALACYCLOVIR HCL 1 G PO TABS: 1000.0000 mg | ORAL_TABLET | Freq: Three times a day (TID) | ORAL | Status: DC

## 2012-08-23 NOTE — Progress Notes (Signed)
Physical Therapy Treatment Patient Details Name: Trevor Trevor MRN: [id number] DOB: 31-Dec-1928 Today's Date: 08/23/2012 Time: 4098-1191 PT Time Calculation (min): 28 min  PT Assessment / Plan / Recommendation  History of Present Illness Pt adm with GI bleed with Hgb of 5.5.  Pt received blood.  Pt also found to have read blistering rash on right flank.  Suspect shingles, moved to airborne negative pressure room.     PT Comments   Pt has spent the last two days in bed and it has significantly impacted his ability to get up and move around the room.  He needs a significant amount of assist (two people pt doing 30-50% of the work).  I am not sure that the ALF will accept him back at this level of care and he may need to pursue SNF for rehab before returning to the ALF.    Follow Up Recommendations  SNF     Does the patient have the potential to tolerate intense rehabilitation    No  Barriers to Discharge   None      Equipment Recommendations  None recommended by PT    Recommendations for Other Services   None  Frequency Min 3X/week   Progress towards PT Goals Progress towards PT goals: Not progressing toward goals - comment (weaker today, unable to move as well as last session)  Plan Discharge plan needs to be updated    Precautions / Restrictions Precautions Precautions: Fall Precaution Comments: bil knee replacement with increased ROM left knee compared to right knee.  Shoes donned during treatment.     Pertinent Vitals/Pain See vitals flow sheet.     Mobility  Bed Mobility Supine to Sit: 1: +2 Total assist;HOB elevated;With rails Supine to Sit: Patient Percentage: 30% Sitting - Scoot to Edge of Bed: 1: +2 Total assist;With rail Sitting - Scoot to [place] of Bed: Patient Percentage: 30% Details for Bed Mobility Assistance: Strong posterior lean in sitting likely due to spinal stiffness from being in bed for the last two days.  Transfers Transfers: Sit to Stand;Stand to  Sit Sit to Stand: 1: +2 Total assist;From elevated surface;With upper extremity assist;From bed Sit to Stand: Patient Percentage: 50% Stand to Sit: 1: +2 Total assist;With upper extremity assist;With armrests;To chair/3-in-1 Stand to Sit: Patient Percentage: 30% Details for Transfer Assistance: To stand bed elevated.  R knee unable to achieve even 50 degrees of flexion limiting his ability to get his feet under him from the start.  Posterior lean in sitting also limiting his ability to initiate anterior weight shift over feet to get to standing.   Ambulation/Gait Ambulation/Gait Assistance: 1: +2 Total assist Ambulation/Gait: Patient Percentage: 50% Ambulation Distance (Feet): 8 Feet Assistive device: Rolling walker Ambulation/Gait Assistance Details: assist needed to progress right leg during gait.  Pt with very flexed posture (realized walker was way too low for him after starting gait).  Pt fatigued quickly and chair had to be pulled over and pt turned to sit as he was reporting "I can't go any further, I'm going down"   Gait Pattern: Step-to pattern;Decreased stride length;Trunk flexed Gait velocity: much less than 1.8 ft/sec which indicates risk for recurrent falls      PT Goals (current goals can now be found in the care plan section) Acute Rehab PT Goals Patient Stated Goal: to get back to short distance gait and WC mobility as he was PTA.   PT Goal Formulation: With patient/family  Visit Information  Last PT Received On:  08/23/12 Assistance Needed: +2 History of Present Illness: Pt adm with GI bleed with Hgb of 5.5.  Pt received blood.  Pt also found to have read blistering rash on right flank.  Suspect shingles, moved to airborne negative pressure room.      Subjective Data  Subjective: Pt's son interested in finding orthotist for custom shoes (pt's shoes donned for gait).   Patient Stated Goal: to get back to short distance gait and WC mobility as he was PTA.     Cognition   Cognition Arousal/Alertness: Awake/alert Behavior During Therapy: WFL for tasks assessed/performed Overall Cognitive Status: History of cognitive impairments - at baseline Memory: Decreased short-term memory    Balance  Static Sitting Balance Static Sitting - Balance Support: Bilateral upper extremity supported;Feet supported Static Sitting - Level of Assistance: 1: +2 Total assist;Patient percentage (comment) (pt 50% attempting to prop up on bil arms, stiff spine ) Static Standing Balance Static Standing - Balance Support: Bilateral upper extremity supported Static Standing - Level of Assistance: 1: +2 Total assist;Patient percentage (comment) (50%)  End of Session PT - End of Session Equipment Utilized During Treatment:  (did not use gait belt due to shingles around trunk) Activity Tolerance: Patient limited by fatigue Patient left: in chair;with call bell/phone within reach;with family/visitor present (son in room assisting with treatment) Nurse Communication: Mobility status;Need for lift equipment (need +2 and may still need maxi move)     Jehan Bonano B. Jonta Gastineau, PT, DPT [phone]   08/23/2012, 3:46 PM

## 2012-08-23 NOTE — Progress Notes (Signed)
Attempted to contact son, Maison Kestenbaum regarding room transfer. No answer, no voice message left.

## 2012-08-23 NOTE — Progress Notes (Signed)
On assessment, red rash on stomach radiating toward back. Pt c/o pain with rash. MD note suggest shingles, ? Isolation. Charge nurse working on transfer for isolation precautions.

## 2012-08-23 NOTE — Progress Notes (Signed)
PATIENT DETAILS Name: Trevor Webb Age: 79 y.o. Sex: male Date of Birth: [birth year] Admit Date: 08/18/2012 Admitting Physician Eddie North, MD ZOX:WRUEAV, Dorene Sorrow, MD  Subjective: No bleeding overnight. Suspected shingles essentially unchanged.  Assessment/Plan: SubacuteGI bleed  -supected Diverticular in etiology  - This is his second episode of diverticular bleed-suspect it is time to permanently stop coumadin and just place on ASA. I had a long discussion with the patient's son-carter-explained that for the immediate future patient will be discontinued off Coumadin. Given the fact that this is his second episode of GI bleed, I suspect we may no longer be able to continue Coumadin. [name] was very understanding, he is aware that, in the next 1-2 weeks we could perhaps start aspirin, he is also aware that aspirin would not provide the same amount of cardio-an embolic prophylaxis as compared to Coumadin. He is accepting and is agreeable with this strategy.  - GI was consulted, endoscopy and colonoscopy were done. Endoscopy did not demonstrate any major abnormalities, colonoscopy did not reveal any evidence of acute blood loss, however there were significant amount of diverticulosis. This time it is presumed that GI bleed was from diverticulosis.   Acute blood loss  - Patient's hemoglobin is stable after 4 units of PRBC transfusion. Please recheck hemoglobin and hematocrit in one week.  - Acute blood loss was from above noted etiology   Coagulopathy  - This is secondary to Coumadin therapy. This has been discontinued. Patient was given one dose of vitamin K on admission. Please see above regarding Coumadin discussion   Acute renal failure  - This is secondary to blood loss, this has resolved following IV fluids and PRBC transfusion.   Possible shingles in the right chest wall area - Started Valtrex on 8/3, place on isolation - some lesions are starting to blister  Hypertension   -BP Controlled  -c/w coreg, lasix, Imdur and ramipril   Chronic Diastolic CHF  -stable and euvolemic during this admission   BPH  -c/w Flomax   CAD-s/p CABG  -no ASA at this point, consider resuming in the next 1-2 weeks  -monitor   Hx of CVA  -stable  -not on ASA at present-non focal exam  -Start ASA when able - suggest in the next 1-2 week  - Coumadin discontinued - given GI bleed and severe anemia   History of paroxysmal atrial fibrillation  - Please see above regarding Coumadin discussion  - Otherwise stable this admission  - Restart aspirin next 1-2 weeks for cardioembolic prophylaxis.   Seizure Disorder  -c/w Keppra   Dementia  -at baseline  -c/w Aricept   Disposition: Remain inpatient-back to ALF when bed available  DVT Prophylaxis: r SCD's  Code Status: DNR  Family Communication Son-[name]-left msg today  Procedures:  EGD/Colonoscopy 8/1  CONSULTS:  GI   MEDICATIONS: Scheduled Meds: . antiseptic oral rinse  15 mL Mouth Rinse q12n4p  . azelastine  2 spray Each Nare BID  . carvedilol  3.125 mg Oral BID WC  . chlorhexidine  15 mL Mouth Rinse BID  . cyanocobalamin  1,000 mcg Intramuscular Q30 days  . donepezil  10 mg Oral QHS  . furosemide  40 mg Oral Daily  . isosorbide mononitrate  15 mg Oral Daily  . levETIRAcetam  500 mg Oral BID  . loratadine  10 mg Oral Daily  . pantoprazole  40 mg Oral BID  . ramipril  5 mg Oral BID  . sertraline  25 mg Oral Daily  .  sodium chloride  3 mL Intravenous Q12H  . tamsulosin  0.4 mg Oral QPC breakfast  . valACYclovir  1,000 mg Oral TID   Continuous Infusions:  PRN Meds:.acetaminophen, nitroGLYCERIN, ondansetron (ZOFRAN) IV, ondansetron, traMADol  Antibiotics: Anti-infectives   Start     Dose/Rate Route Frequency Ordered Stop   08/22/12 1145  valACYclovir (VALTREX) tablet 1,000 mg     1,000 mg Oral 3 times daily 08/22/12 1139 08/29/12 0959       PHYSICAL EXAM: Vital signs in last 24  hours: Filed Vitals:   08/22/12 1744 08/22/12 2247 08/23/12 0511 08/23/12 1007  BP: 119/67 117/76 127/81 132/91  Pulse: 74 71 66 66  Temp: 98.6 F (37 C) 98.3 F (36.8 C) 98.1 F (36.7 C) 100.4 F (38 C)  TempSrc: Oral Oral Oral Oral  Resp: 18 18 18 18   Height:      Weight:      SpO2: 97% 97% 98% 97%    Weight change:  Filed Weights   08/19/12 2202  Weight: 73.5 kg (162 lb 0.6 oz)   Body mass index is 23.92 kg/(m^2).   Gen Exam: Awake with clear speech.   Neck: Supple, No JVD.   Chest: B/L Clear.   CVS: S1 S2 Regular, no murmurs.  Abdomen: soft, BS +, non tender, non distended.  Extremities: no edema, lower extremities warm to touch. Neurologic: Non Focal.  Skin: ? Developing shingles in the right chest wall area , with some lesions started to blister Wounds: N/A.    Intake/Output from previous day:  Intake/Output Summary (Last 24 hours) at 08/23/12 1140 Last data filed at 08/22/12 2256  Gross per 24 hour  Intake    243 ml  Output   1000 ml  Net   -757 ml     LAB RESULTS: CBC  Recent Labs Lab 08/18/12 1527 08/19/12 0127  08/21/12 0806 08/21/12 2010 08/22/12 0849 08/22/12 1902 08/23/12 0815  WBC 9.8 8.4  < > 6.3 7.1 7.6 10.0 11.7*  HGB 5.5* 6.7*  < > 10.2* 9.6* 9.4* 9.7* 10.2*  HCT 17.2* 19.8*  < > 30.7* 29.1* 28.5* 29.1* 31.6*  PLT PLATELET CLUMPS NOTED ON SMEAR, UNABLE TO ESTIMATE 268  < > 254 259 247 272 264  MCV 83.9 82.5  < > 83.9 85.3 84.3 84.3 84.7  MCH 26.8 27.9  < > 27.9 28.2 27.8 28.1 27.3  MCHC 32.0 33.8  < > 33.2 33.0 33.0 33.3 32.3  RDW 15.0 14.6  < > 14.4 14.6 14.4 14.6 14.5  LYMPHSABS 1.4 1.4  --   --   --   --   --   --   MONOABS 0.9 0.9  --   --   --   --   --   --   EOSABS 0.2 0.2  --   --   --   --   --   --   BASOSABS 0.1 0.1  --   --   --   --   --   --   < > = values in this interval not displayed.  Chemistries   Recent Labs Lab 08/18/12 1527 08/20/12 1218  NA 137 142  K 4.3 3.6  CL 102 107  CO2 21 27  GLUCOSE 106*  84  BUN 46* 23  CREATININE 1.76* 1.27  CALCIUM 8.4 8.1*    CBG: No results found for this basename: GLUCAP,  in the last 168 hours  GFR Estimated Creatinine Clearance: 44.8 ml/min (  by C-G formula based on Cr of 1.27).  Coagulation profile  Recent Labs Lab 08/18/12 1611 08/19/12 0127 08/20/12 1218  INR 3.07* 3.07* 1.34    Cardiac Enzymes No results found for this basename: CK, CKMB, TROPONINI, MYOGLOBIN,  in the last 168 hours  No components found with this basename: POCBNP,  No results found for this basename: DDIMER,  in the last 72 hours No results found for this basename: HGBA1C,  in the last 72 hours No results found for this basename: CHOL, HDL, LDLCALC, TRIG, CHOLHDL, LDLDIRECT,  in the last 72 hours No results found for this basename: TSH, T4TOTAL, FREET3, T3FREE, THYROIDAB,  in the last 72 hours No results found for this basename: VITAMINB12, FOLATE, FERRITIN, TIBC, IRON, RETICCTPCT,  in the last 72 hours No results found for this basename: LIPASE, AMYLASE,  in the last 72 hours  Urine Studies No results found for this basename: UACOL, UAPR, USPG, UPH, UTP, UGL, UKET, UBIL, UHGB, UNIT, UROB, ULEU, UEPI, UWBC, URBC, UBAC, CAST, CRYS, UCOM, BILUA,  in the last 72 hours  MICROBIOLOGY: Recent Results (from the past 240 hour(s))  MRSA PCR SCREENING     Status: None   Collection Time    08/19/12 12:01 AM      Result Value Range Status   MRSA by PCR NEGATIVE  NEGATIVE Final   Comment:            The GeneXpert MRSA Assay (FDA     approved for NASAL specimens     only), is one component of a     comprehensive MRSA colonization     surveillance program. It is not     intended to diagnose MRSA     infection nor to guide or     monitor treatment for     MRSA infections.    RADIOLOGY STUDIES/RESULTS: Dg Chest 2 View  08/18/2012   *RADIOLOGY REPORT*  Clinical Data: Nausea  CHEST - 2 VIEW  Comparison: March 18, 2011.  Findings: Status post coronary artery bypass  graft.  Stable cardiomegaly.  Right lung is clear.  Interval development small linear density in lingular region most consistent with subsegmental atelectasis.  No pleural effusion or pneumothorax is noted.  IMPRESSION: Minimal lingular subsegmental atelectasis.  Postoperative changes as described above.   Original Report Authenticated By: Lupita Raider.,  M.D.    Jeoffrey Massed, MD  [facility] Pager:[phone]  If 7PM-7AM, please contact night-coverage [email or web address] Password [email or web address] 08/23/2012, 11:40 AM   LOS: 5 days

## 2012-08-23 NOTE — Progress Notes (Signed)
Report called to Tonya to transfer patient to [id number] for isolation precautions

## 2012-08-24 ENCOUNTER — Inpatient Hospital Stay (HOSPITAL_COMMUNITY): Payer: Medicare PPO

## 2012-08-24 DIAGNOSIS — N390 Urinary tract infection, site not specified: Secondary | ICD-10-CM

## 2012-08-24 LAB — CBC
MCH: 27.8 pg (ref 26.0–34.0)
MCHC: 32.6 g/dL (ref 30.0–36.0)
MCV: 85.2 fL (ref 78.0–100.0)
Platelets: 251 10*3/uL (ref 150–400)

## 2012-08-24 LAB — URINALYSIS, ROUTINE W REFLEX MICROSCOPIC
Glucose, UA: NEGATIVE mg/dL
Ketones, ur: 15 mg/dL — AB
Protein, ur: >300 mg/dL — AB
Urobilinogen, UA: 1 mg/dL (ref 0.0–1.0)

## 2012-08-24 LAB — BASIC METABOLIC PANEL
CO2: 23 mEq/L (ref 19–32)
Calcium: 8.6 mg/dL (ref 8.4–10.5)
Creatinine, Ser: 1.88 mg/dL — ABNORMAL HIGH (ref 0.50–1.35)
GFR calc non Af Amer: 32 mL/min — ABNORMAL LOW (ref >90)
Sodium: 137 mEq/L (ref 135–145)

## 2012-08-24 LAB — URINE MICROSCOPIC-ADD ON

## 2012-08-24 MED ORDER — ASPIRIN EC 81 MG PO TBEC
81.0000 mg | DELAYED_RELEASE_TABLET | Freq: Every day | ORAL | Status: DC
  Administered 2012-08-24 – 2012-08-27 (×4): 81 mg via ORAL
  Filled 2012-08-24 (×4): qty 1

## 2012-08-24 MED ORDER — PIPERACILLIN-TAZOBACTAM 3.375 G IVPB
3.3750 g | Freq: Three times a day (TID) | INTRAVENOUS | Status: DC
  Administered 2012-08-24 – 2012-08-27 (×9): 3.375 g via INTRAVENOUS
  Filled 2012-08-24 (×13): qty 50

## 2012-08-24 MED ORDER — SODIUM CHLORIDE 0.9 % NICU IV INFUSION SIMPLE
INJECTION | INTRAVENOUS | Status: DC
Start: 2012-08-24 — End: 2012-08-27
  Administered 2012-08-24: 12:00:00 via INTRAVENOUS
  Administered 2012-08-25: 75 mL/h via INTRAVENOUS
  Filled 2012-08-24 (×14): qty 500

## 2012-08-24 NOTE — Progress Notes (Signed)
ANTIBIOTIC CONSULT NOTE - INITIAL  Pharmacy Consult for Zosyn Indication: r/o UTI, fever, leukocytosis  Allergies  Allergen Reactions  . Morphine And Related     Delusions    Patient Measurements: Height: 5\' 9"  (175.3 cm) Weight: 162 lb 0.6 oz (73.5 kg) IBW/kg (Calculated) : 70.7  Vital Signs: Temp: 99.2 F (37.3 C) (08/05 1034) Temp src: Oral (08/05 1034) BP: 104/58 mmHg (08/05 1034) Pulse Rate: 64 (08/05 1034) Intake/Output from previous day: 08/04 0701 - 08/05 0700 In: 843 [P.O.:840; I.V.:3] Out: 950 [Urine:950] Intake/Output from this shift: Total I/O In: 360 [P.O.:360] Out: -   Labs:  Recent Labs  08/23/12 0815 08/23/12 1829 08/24/12 0900  WBC 11.7* 14.6* 18.5*  HGB 10.2* 9.6* 10.7*  PLT 264 230 251  CREATININE  --   --  1.88*   Estimated Creatinine Clearance: 30.3 ml/min (by C-G formula based on Cr of 1.88). No results found for this basename: VANCOTROUGH, Leodis Binet, VANCORANDOM, GENTTROUGH, GENTPEAK, GENTRANDOM, TOBRATROUGH, TOBRAPEAK, TOBRARND, AMIKACINPEAK, AMIKACINTROU, AMIKACIN,  in the last 72 hours   Microbiology: Recent Results (from the past 720 hour(s))  MRSA PCR SCREENING     Status: None   Collection Time    08/19/12 12:01 AM      Result Value Range Status   MRSA by PCR NEGATIVE  NEGATIVE Final   Comment:            The GeneXpert MRSA Assay (FDA     approved for NASAL specimens     only), is one component of a     comprehensive MRSA colonization     surveillance program. It is not     intended to diagnose MRSA     infection nor to guide or     monitor treatment for     MRSA infections.    Medical History: Past Medical History  Diagnosis Date  . Hypertension   . CHF, left ventricular   . Paroxysmal atrial fibrillation   . CHF (congestive heart failure)   . Angina   . Myocardial infarction 2009  . Stroke 2011    residual:  "right sided weakness; problems walking; dementia"  . Dementia     S/P CVA 2011  . Arthritis   .  Dysphasia   . A-fib   . GERD (gastroesophageal reflux disease)   . Alzheimer's dementia   . Seizures     epilepsy  . Diverticulitis   . Abdominal abscess     Assessment: 79 y/o male admitted on 7/30 with diverticular GIB, on Coumadin PTA. Pharmacy consulted to begin Zosyn for low grade fever, leukocytosis, and r/o UTI.  Goal of Therapy:  Eradication of infection  Plan:  -Zosyn 3.375 g IV q8h to be infused over 4 hours Follow-up renal function, clinical course, and culture data  [facility], Golden.D., BCPS Clinical Pharmacist Pager: [phone] 08/24/2012 12:24 PM

## 2012-08-24 NOTE — Progress Notes (Signed)
PATIENT DETAILS Name: Trevor Webb Age: 79 y.o. Sex: male Date of Birth: [birth year] Admit Date: 08/18/2012 Admitting Physician No admitting provider for patient encounter. [id number], JERRY, MD  Brief Summary: Patient is an 79 year old male with a history of atrial fibrillation, on Coumadin prior to this admission, prior history of GI bleed presumed to be a diverticular bleed presented to the hospital with fatigue. He was found to have a hemoglobin of 5.5, and was admitted for further evaluation and treatment. Coumadin was held on admission, he was given a total of 4 units of PRBC, GI was consulted, endoscopy and colonoscopy were done this admission which did not show any acute bleeding sources, colonoscopy did show diverticulosis. Long discussions were held with patient's son-[name]-given that this is his second episode of a diverticular bleed while on Coumadin, Coumadin therapy has been permanently discontinued. Patient's son is aware of the cardioembolic risks, and is agreeable to discontinue Coumadin and be placed on aspirin instead. During this hospital stay, patient's course was complicated by development of possible Herpes Zoster in the T8 dermatome area on the right side. He was planned to be discharged to a assisted living facility, however they could not accommodate his contact precautions requirements (due to shingles), therefore his discharge was delayed. Unfortunately, it was noticed he developed a low-grade fever with worsening leukocytosis during this time, no foci is evident at this time. Discharge has now been postponed, patient has been placed on IV Zosyn as of 8/5, chest x-ray, UA and blood cultures have been obtained.    Subjective: No bleeding overnight. Suspected shingles essentially unchanged.   Assessment/Plan:  Leukocytosis with Low Grade Fever -8/5 planning for d/c but - rise in wbc, creatinine and low grade fever. -suspect UTI -Check U/A, CXR, Bld  Cultures -Start Zosyn per pharmacy after bld cx are drawn -Restart IVF and hold lasix. -D/C put on hold for now.  SubacuteGI bleed  -supected Diverticular in etiology  - This is his second episode of diverticular bleed (while on coumadin)-suspect it is time to permanently stop coumadin and just place on ASA. I had a long discussion with the patient's son-carter-explained that for the immediate future patient will be discontinued off Coumadin. Given the fact that this is his second episode of GI bleed, I suspect we may no longer be able to continue Coumadin. [name] was very understanding, he is aware that we will start aspirin, he is also aware that aspirin would not provide the same amount of cardio-an embolic prophylaxis as compared to Coumadin. He is accepting and is agreeable with this strategy.  - GI was consulted, endoscopy and colonoscopy were done. Endoscopy did not demonstrate any major abnormalities, colonoscopy did not reveal any evidence of acute blood loss, however there were significant amount of diverticulosis. This time it is presumed that GI bleed was from diverticulosis.   Acute blood loss  - Patient's hemoglobin is stable after 4 units of PRBC transfusion. Please recheck hemoglobin and hematocrit in one week.  - Acute blood loss was from above noted etiology   Coagulopathy  - This is secondary to Coumadin therapy. This has been discontinued. Patient was given one dose of vitamin K on admission. Please see above regarding Coumadin discussion   Acute renal failure  -ARF from admission resolved. -(8/5) Seems to have re-occurred.  Check u/a.  Start IVF, Hold lasix   Possible shingles in the right chest wall area - Started Valtrex on 8/3, place on isolation - some lesions are starting to  blister  Hypertension  -BP Controlled  -c/w coreg, Imdur and ramipril  -lasix held as of 8/5.  Chronic Diastolic CHF  -stable and euvolemic during this admission   BPH  -c/w Flomax    CAD-s/p CABG  -Asa 81 mg restarted 8/5 -monitor   Hx of CVA  -stable  -restarted asa 8/5 - Coumadin discontinued - given GI bleed and severe anemia   History of paroxysmal atrial fibrillation  - Please see above regarding Coumadin discussion  - Otherwise stable this admission  - Restart aspirin 81 mg 08/24/12.   Seizure Disorder  -c/w Keppra   Dementia  -at baseline  -c/w Aricept   Disposition: Remain inpatient. PT recommends SNF, but [facility] is willing to provide additional resources Back to ALF when medically appropriate.  DVT Prophylaxis: r SCD's  Code Status: DNR  Family Communication Spoke with [name] (son) 8/5 on telephone.  Procedures:  EGD/Colonoscopy 8/1  CONSULTS:  GI   MEDICATIONS: Scheduled Meds: . antiseptic oral rinse  15 mL Mouth Rinse q12n4p  . aspirin EC  81 mg Oral Daily  . azelastine  2 spray Each Nare BID  . carvedilol  3.125 mg Oral BID WC  . chlorhexidine  15 mL Mouth Rinse BID  . cyanocobalamin  1,000 mcg Intramuscular Q30 days  . donepezil  10 mg Oral QHS  . isosorbide mononitrate  15 mg Oral Daily  . levETIRAcetam  500 mg Oral BID  . loratadine  10 mg Oral Daily  . pantoprazole  40 mg Oral BID  . ramipril  5 mg Oral BID  . sertraline  25 mg Oral Daily  . tamsulosin  0.4 mg Oral QPC breakfast  . valACYclovir  1,000 mg Oral TID   Continuous Infusions: . sodium chloride 0.9 %     PRN Meds:.acetaminophen, nitroGLYCERIN, ondansetron (ZOFRAN) IV, ondansetron, traMADol  Antibiotics: Anti-infectives   Start     Dose/Rate Route Frequency Ordered Stop   08/23/12 0000  valACYclovir (VALTREX) 1000 MG tablet     1,000 mg Oral 3 times daily 08/23/12 1228     08/22/12 1145  valACYclovir (VALTREX) tablet 1,000 mg     1,000 mg Oral 3 times daily 08/22/12 1139 08/29/12 0959       PHYSICAL EXAM: Vital signs in last 24 hours: Filed Vitals:   08/23/12 1007 08/23/12 2200 08/24/12 0600 08/24/12 1034  BP: 132/91 110/45 118/52  104/58  Pulse: 66 84 84 64  Temp: 100.4 F (38 C) 99.1 F (37.3 C) 98.6 F (37 C) 99.2 F (37.3 C)  TempSrc: Oral   Oral  Resp: 18 20 18 20   Height:      Weight:      SpO2: 97% 98% 98%     Weight change:  Filed Weights   08/19/12 2202  Weight: 73.5 kg (162 lb 0.6 oz)   Body mass index is 23.92 kg/(m^2).   Gen Exam: sleepy, slightly slow to respond. Lying in bed, smells like urine   Neck: Supple, No JVD.   Chest: B/L Clear.  No w/c/r CVS: S1 S2 Regular, no murmurs.  Abdomen: soft, BS +, non tender, non distended.  Extremities: no edema, lower extremities warm to touch. Neurologic: Non Focal.  Skin: Pink rash on right flank, some vesicles, no crusting yet.   Intake/Output from previous day:  Intake/Output Summary (Last 24 hours) at 08/24/12 1104 Last data filed at 08/24/12 0830  Gross per 24 hour  Intake    963 ml  Output  950 ml  Net     13 ml     LAB RESULTS: CBC  Recent Labs Lab 08/18/12 1527 08/19/12 0127  08/22/12 0849 08/22/12 1902 08/23/12 0815 08/23/12 1829 08/24/12 0900  WBC 9.8 8.4  < > 7.6 10.0 11.7* 14.6* 18.5*  HGB 5.5* 6.7*  < > 9.4* 9.7* 10.2* 9.6* 10.7*  HCT 17.2* 19.8*  < > 28.5* 29.1* 31.6* 29.5* 32.8*  PLT PLATELET CLUMPS NOTED ON SMEAR, UNABLE TO ESTIMATE 268  < > 247 272 264 230 251  MCV 83.9 82.5  < > 84.3 84.3 84.7 84.8 85.2  MCH 26.8 27.9  < > 27.8 28.1 27.3 27.6 27.8  MCHC 32.0 33.8  < > 33.0 33.3 32.3 32.5 32.6  RDW 15.0 14.6  < > 14.4 14.6 14.5 14.6 14.7  LYMPHSABS 1.4 1.4  --   --   --   --   --   --   MONOABS 0.9 0.9  --   --   --   --   --   --   EOSABS 0.2 0.2  --   --   --   --   --   --   BASOSABS 0.1 0.1  --   --   --   --   --   --   < > = values in this interval not displayed.  Chemistries   Recent Labs Lab 08/18/12 1527 08/20/12 1218 08/24/12 0900  NA 137 142 137  K 4.3 3.6 3.8  CL 102 107 100  CO2 21 27 23   GLUCOSE 106* 84 122*  BUN 46* 23 32*  CREATININE 1.76* 1.27 1.88*  CALCIUM 8.4 8.1* 8.6     Coagulation profile  Recent Labs Lab 08/18/12 1611 08/19/12 0127 08/20/12 1218  INR 3.07* 3.07* 1.34     MICROBIOLOGY: Recent Results (from the past 240 hour(s))  MRSA PCR SCREENING     Status: None   Collection Time    08/19/12 12:01 AM      Result Value Range Status   MRSA by PCR NEGATIVE  NEGATIVE Final   Comment:            The GeneXpert MRSA Assay (FDA     approved for NASAL specimens     only), is one component of a     comprehensive MRSA colonization     surveillance program. It is not     intended to diagnose MRSA     infection nor to guide or     monitor treatment for     MRSA infections.    RADIOLOGY STUDIES/RESULTS: Dg Chest 2 View  08/18/2012   *RADIOLOGY REPORT*  Clinical Data: Nausea  CHEST - 2 VIEW  Comparison: March 18, 2011.  Findings: Status post coronary artery bypass graft.  Stable cardiomegaly.  Right lung is clear.  Interval development small linear density in lingular region most consistent with subsegmental atelectasis.  No pleural effusion or pneumothorax is noted.  IMPRESSION: Minimal lingular subsegmental atelectasis.  Postoperative changes as described above.   Original Report Authenticated By: Lupita Raider.,  M.D.    [facility] Pager:[phone]  If 7PM-7AM, please contact night-coverage [email or web address] Password [email or web address] 08/24/2012, 11:04 AM   LOS: 6 days    Attending  - Patient seen and examined, the with the above assessment and plan. Unfortunately developed low-grade fever and worsening leukocytosis, no foci of infection evident however suspect UTI. Panculture and start on empiric Zosyn,  or discharge.  Windell Norfolk MD

## 2012-08-25 LAB — BASIC METABOLIC PANEL
BUN: 38 mg/dL — ABNORMAL HIGH (ref 6–23)
Calcium: 7.9 mg/dL — ABNORMAL LOW (ref 8.4–10.5)
Creatinine, Ser: 2.06 mg/dL — ABNORMAL HIGH (ref 0.50–1.35)
GFR calc Af Amer: 33 mL/min — ABNORMAL LOW (ref >90)
GFR calc non Af Amer: 28 mL/min — ABNORMAL LOW (ref >90)
Potassium: 3.5 mEq/L (ref 3.5–5.1)

## 2012-08-25 LAB — CBC
MCHC: 33.2 g/dL (ref 30.0–36.0)
RDW: 14.6 % (ref 11.5–15.5)

## 2012-08-25 NOTE — Progress Notes (Signed)
Physical Therapy Treatment Patient Details Name: Trevor Webb MRN: [id number] DOB: 1928-09-26 Today's Date: 08/25/2012 Time: 8657-8469 PT Time Calculation (min): 27 min  PT Assessment / Plan / Recommendation  History of Present Illness Pt adm with GI bleed with Hgb of 5.5.  Pt received blood.  Pt also found to have read blistering rash on right flank.  Suspect shingles, moved to airborne negative pressure room.     PT Comments   Pt has significant stiffness and restricted ROM preventing ability to perform or comfortably assume most functional positions, so focused today on trunk ROM and flexibility in sitting and then positioning for pressure sore prevention.  Pt encouraged to move around in bed as much as able and to work with nursing to lift out of bed.     Follow Up Recommendations  SNF     Does the patient have the potential to tolerate intense rehabilitation     Barriers to Discharge        Equipment Recommendations  None recommended by PT    Recommendations for Other Services    Frequency Min 2X/week   Progress towards PT Goals Progress towards PT goals: Progressing toward goals  Plan Current plan remains appropriate    Precautions / Restrictions Restrictions Other Position/Activity Restrictions: skin protection, turn and pressure relief   Pertinent Vitals/Pain None specifically     Mobility  Bed Mobility Bed Mobility: Rolling Right;Right Sidelying to Sit;Sit to Sidelying Right Rolling Right: 2: Max assist Right Sidelying to Sit: 1: +1 Total assist;HOB elevated Sit to Sidelying Right: 1: +1 Total assist;HOB flat Details for Bed Mobility Assistance: continuous physical assistance to perform transitional movements and to maintain static postures.  Unable to flex right knee sufficiently to reposition or to support in sitting.  Extremely limited sitting abilty, so treatment focused on trunk mobility including supported sitting with assisted UE PROM/AAROM into abduction  and extension below 90 degree due to limitation; supported cervical extension toward midline with .limited ability to perform or maintain head up; manual facilitation to sternal/pectoral musuclature to encourage increased stretch ROM; and facilitated lateral lean to elbow with opposing UE reach across to target repeated x 5 each side with progressively increased reach noted.   Transfers Transfers: Not assessed Ambulation/Gait Ambulation/Gait Assistance: Not tested (comment) Stairs: No Wheelchair Mobility Wheelchair Mobility: No    Exercises Low Level/ICU Exercises Ankle Circles/Pumps: AAROM;Both;5 reps;Supine Quad Sets: AAROM;Both;5 reps;Supine   PT Diagnosis:    PT Problem List:   PT Treatment Interventions:     PT Goals (current goals can now be found in the care plan section)    Visit Information  Last PT Received On: 08/25/12 Assistance Needed: +2 History of Present Illness: Pt adm with GI bleed with Hgb of 5.5.  Pt received blood.  Pt also found to have read blistering rash on right flank.  Suspect shingles, moved to airborne negative pressure room.      Subjective Data      Cognition  Cognition Arousal/Alertness: Awake/alert Behavior During Therapy: WFL for tasks assessed/performed Overall Cognitive Status: History of cognitive impairments - at baseline    Balance  Balance Balance Assessed: Yes Static Sitting Balance Static Sitting - Balance Support: Bilateral upper extremity supported;Left upper extremity supported;Right upper extremity supported;Feet supported Static Sitting - Level of Assistance: 2: Max assist  End of Session PT - End of Session Activity Tolerance: Patient limited by fatigue Patient left: in bed;with call bell/phone within reach (SCD on, on right side, pillows to support,  HOB 30; feet elev) Nurse Communication: Mobility status   GP     Dennis Bast 08/25/2012, 3:49 PM

## 2012-08-25 NOTE — Progress Notes (Signed)
PATIENT DETAILS Name: Trevor Webb Age: 79 y.o. Sex: male Date of Birth: 1928/03/06 Admit Date: 08/18/2012 Admitting Physician No admitting provider for patient encounter. [id number], JERRY, MD  Brief Summary: Patient is an 79 year old male with a history of atrial fibrillation, on Coumadin prior to this admission, prior history of GI bleed presumed to be a diverticular bleed presented to the hospital with fatigue. He was found to have a hemoglobin of 5.5, and was admitted for further evaluation and treatment. Coumadin was held on admission, he was given a total of 4 units of PRBC, GI was consulted, endoscopy and colonoscopy were done this admission which did not show any acute bleeding sources, colonoscopy did show diverticulosis. Long discussions were held with patient's son-[name]-given that this is his second episode of a diverticular bleed while on Coumadin, Coumadin therapy has been permanently discontinued. Patient's son is aware of the cardioembolic risks, and is agreeable to discontinue Coumadin and be placed on aspirin instead. During this hospital stay, patient's course was complicated by development of possible Herpes Zoster in the T8 dermatome area on the right side. He was planned to be discharged to a assisted living facility, however they could not accommodate his contact precautions requirements (due to shingles), therefore his discharge was delayed. Unfortunately, it was noticed he developed a low-grade fever with worsening leukocytosis during this time, no foci is evident at this time. Discharge has now been postponed, patient has been placed on IV Zosyn as of 8/5, chest x-ray, UA and blood cultures have been obtained.    Subjective: No bleeding overnight. Seems about the same to his son.  Has baseline dementia.  No other issues reported overnight   Assessment/Plan:  Leukocytosis with Low Grade Fever -8/5 planning for d/c but - rise in wbc, creatinine and low grade  fever. -suspect UTI-UA shows ihigh Ph and crystals-await culture -Check U/A, CXR, Bld Cultures -Start Zosyn per pharmacy after bld cx are drawn -Restart IVF and hold lasix. -D/C put on hold for now.  SubacuteGI bleed  -supected Diverticular in etiology  - This is his second episode of diverticular bleed (while on coumadin)-suspect it is time to permanently stop coumadin and just place on ASA.  Suspect we may no longer be able to continue Coumadin. Son understanding, he is aware that we will start aspirin, he is also aware that aspirin would not provide the same amount of cardio-an embolic prophylaxis as compared to Coumadin. He is accepting and is agreeable with this strategy.  - GI was consulted, endoscopy and colonoscopy were done. Endoscopy did not demonstrate any major abnormalities, colonoscopy did not reveal any evidence of acute blood loss, however there were significant amount of diverticulosis. This time it is presumed that GI bleed was from diverticulosis.  -Hb dropped a point-monitor  Acute blood loss  - Patient's hemoglobin is stable after 4 units of PRBC transfusion. Please recheck hemoglobin and hematocrit in one week.  - Acute blood loss was from above noted etiology   Coagulopathy  - This is secondary to Coumadin therapy. This has been discontinued. Patient was given one dose of vitamin K on admission. Please see above regarding Coumadin discussion   Acute renal failure  -ARF from admission resolved. -(8/5) Seems to have re-occurred.  Check u/a.  Start IVF, Hold lasix  -Rpt Bmet am  Possible shingles in the right chest wall area - Started Valtrex on 8/3, place on isolation - some lesions are starting to blister  Hypertension  -BP Controlled  -c/w  coreg, Imdur  -Ramipril held from 8/6 2/2 to AKI -lasix held as of 8/5.  Chronic Diastolic CHF  -stable and euvolemic during this admission   BPH  -c/w Flomax   CAD-s/p CABG  -Asa 81 mg restarted 8/5 -monitor   Hx  of CVA  -stable  -restarted asa 8/5 - Coumadin discontinued - given GI bleed and severe anemia   History of paroxysmal atrial fibrillation  - Please see above regarding Coumadin discussion  - Otherwise stable this admission  - Restart aspirin 81 mg 08/24/12.   Seizure Disorder  -c/w Keppra   Dementia  -at baseline  -c/w Aricept   Disposition: Remain inpatient. PT recommends SNF, but [facility] is willing to provide additional resources Back to ALF when medically appropriate.  DVT Prophylaxis: r SCD's  Code Status: DNR  Family Communication Spoke with [name] (son) 8/5 on telephone.  Procedures:  EGD/Colonoscopy 8/1  CONSULTS:  GI   MEDICATIONS: Scheduled Meds: . antiseptic oral rinse  15 mL Mouth Rinse q12n4p  . aspirin EC  81 mg Oral Daily  . azelastine  2 spray Each Nare BID  . carvedilol  3.125 mg Oral BID WC  . chlorhexidine  15 mL Mouth Rinse BID  . cyanocobalamin  1,000 mcg Intramuscular Q30 days  . donepezil  10 mg Oral QHS  . isosorbide mononitrate  15 mg Oral Daily  . levETIRAcetam  500 mg Oral BID  . loratadine  10 mg Oral Daily  . pantoprazole  40 mg Oral BID  . piperacillin-tazobactam (ZOSYN)  IV  3.375 g Intravenous Q8H  . sertraline  25 mg Oral Daily  . tamsulosin  0.4 mg Oral QPC breakfast  . valACYclovir  1,000 mg Oral TID   Continuous Infusions: . sodium chloride 0.9 % 75 mL/hr (08/25/12 0656)   PRN Meds:.acetaminophen, nitroGLYCERIN, ondansetron (ZOFRAN) IV, ondansetron, traMADol  Antibiotics: Anti-infectives   Start     Dose/Rate Route Frequency Ordered Stop   08/24/12 1300  piperacillin-tazobactam (ZOSYN) IVPB 3.375 g     3.375 g 12.5 mL/hr over 240 Minutes Intravenous 3 times per day 08/24/12 1223     08/23/12 0000  valACYclovir (VALTREX) 1000 MG tablet     1,000 mg Oral 3 times daily 08/23/12 1228     08/22/12 1145  valACYclovir (VALTREX) tablet 1,000 mg     1,000 mg Oral 3 times daily 08/22/12 1139 08/29/12 0959        PHYSICAL EXAM: Vital signs in last 24 hours: Filed Vitals:   08/24/12 2145 08/25/12 0201 08/25/12 0510 08/25/12 0923  BP: 130/66 127/53 126/62 107/55  Pulse: 72 78 81 80  Temp: 99.1 F (37.3 C) 98.9 F (37.2 C) 98.7 F (37.1 C) 97.8 F (36.6 C)  TempSrc: Oral Oral Oral Oral  Resp: 20 19 18 18   Height:      Weight:      SpO2: 98% 97% 97% 97%    Weight change:  Filed Weights   08/19/12 2202  Weight: 73.5 kg (162 lb 0.6 oz)   Body mass index is 23.92 kg/(m^2).   Gen Exam: awake alert at baselineper sonLying in bed, smells like urine   Neck: Supple, No JVD.   Chest: B/L Clear.  No w/c/r CVS: S1 S2 Regular, no murmurs.  Abdomen: soft, BS +, non tender, non distended.  Extremities: no edema, lower extremities warm to touch. Neurologic: Non Focal.  Skin: Pink rash on right flank, some vesicles, no crusting yet.   Intake/Output from previous  day:  Intake/Output Summary (Last 24 hours) at 08/25/12 1226 Last data filed at 08/25/12 0215  Gross per 24 hour  Intake      0 ml  Output    475 ml  Net   -475 ml     LAB RESULTS: CBC  Recent Labs Lab 08/18/12 1527 08/19/12 0127  08/22/12 1902 08/23/12 0815 08/23/12 1829 08/24/12 0900 08/25/12 0535  WBC 9.8 8.4  < > 10.0 11.7* 14.6* 18.5* 13.8*  HGB 5.5* 6.7*  < > 9.7* 10.2* 9.6* 10.7* 8.1*  HCT 17.2* 19.8*  < > 29.1* 31.6* 29.5* 32.8* 24.4*  PLT PLATELET CLUMPS NOTED ON SMEAR, UNABLE TO ESTIMATE 268  < > 272 264 230 251 240  MCV 83.9 82.5  < > 84.3 84.7 84.8 85.2 83.3  MCH 26.8 27.9  < > 28.1 27.3 27.6 27.8 27.6  MCHC 32.0 33.8  < > 33.3 32.3 32.5 32.6 33.2  RDW 15.0 14.6  < > 14.6 14.5 14.6 14.7 14.6  LYMPHSABS 1.4 1.4  --   --   --   --   --   --   MONOABS 0.9 0.9  --   --   --   --   --   --   EOSABS 0.2 0.2  --   --   --   --   --   --   BASOSABS 0.1 0.1  --   --   --   --   --   --   < > = values in this interval not displayed.  Chemistries   Recent Labs Lab 08/18/12 1527 08/20/12 1218  08/24/12 0900 08/25/12 0535  NA 137 142 137 136  K 4.3 3.6 3.8 3.5  CL 102 107 100 103  CO2 21 27 23 23   GLUCOSE 106* 84 122* 94  BUN 46* 23 32* 38*  CREATININE 1.76* 1.27 1.88* 2.06*  CALCIUM 8.4 8.1* 8.6 7.9*    Coagulation profile  Recent Labs Lab 08/18/12 1611 08/19/12 0127 08/20/12 1218  INR 3.07* 3.07* 1.34     MICROBIOLOGY: Recent Results (from the past 240 hour(s))  MRSA PCR SCREENING     Status: None   Collection Time    08/19/12 12:01 AM      Result Value Range Status   MRSA by PCR NEGATIVE  NEGATIVE Final   Comment:            The GeneXpert MRSA Assay (FDA     approved for NASAL specimens     only), is one component of a     comprehensive MRSA colonization     surveillance program. It is not     intended to diagnose MRSA     infection nor to guide or     monitor treatment for     MRSA infections.  CULTURE, BLOOD (ROUTINE X 2)     Status: None   Collection Time    08/24/12  3:10 PM      Result Value Range Status   Specimen Description BLOOD LEFT HAND   Final   Special Requests BOTTLES DRAWN AEROBIC ONLY 2CC   Final   Culture  Setup Time     Final   Value: 08/24/2012 22:41     Performed at [facility]   Culture     Final   Value:        BLOOD CULTURE RECEIVED NO GROWTH TO DATE CULTURE WILL BE HELD FOR 5 DAYS BEFORE ISSUING  A FINAL NEGATIVE REPORT     Performed at [facility]   Report Status PENDING   Incomplete  CULTURE, BLOOD (ROUTINE X 2)     Status: None   Collection Time    08/24/12  3:20 PM      Result Value Range Status   Specimen Description BLOOD LEFT HAND   Final   Special Requests BOTTLES DRAWN AEROBIC ONLY 3CC   Final   Culture  Setup Time     Final   Value: 08/24/2012 22:40     Performed at [facility]   Culture     Final   Value:        BLOOD CULTURE RECEIVED NO GROWTH TO DATE CULTURE WILL BE HELD FOR 5 DAYS BEFORE ISSUING A FINAL NEGATIVE REPORT     Performed at [facility]   Report Status  PENDING   Incomplete    RADIOLOGY STUDIES/RESULTS: Dg Chest 2 View  08/18/2012   *RADIOLOGY REPORT*  Clinical Data: Nausea  CHEST - 2 VIEW  Comparison: March 18, 2011.  Findings: Status post coronary artery bypass graft.  Stable cardiomegaly.  Right lung is clear.  Interval development small linear density in lingular region most consistent with subsegmental atelectasis.  No pleural effusion or pneumothorax is noted.  IMPRESSION: Minimal lingular subsegmental atelectasis.  Postoperative changes as described above.   Original Report Authenticated By: Lupita Raider.,  M.D.    Rhetta Mura, PA-C [facility] Pager:[phone]  If 7PM-7AM, please contact night-coverage [email or web address] Password [email or web address] 08/25/2012, 12:26 PM   LOS: 7 days    Attending  - Patient seen and examined, the with the above assessment and plan. Unfortunately developed low-grade fever and worsening leukocytosis, no foci of infection evident however suspect UTI. Panculture and start on empiric Zosyn, or discharge.  Windell Norfolk MD

## 2012-08-26 LAB — COMPREHENSIVE METABOLIC PANEL
ALT: 15 U/L (ref 0–53)
AST: 22 U/L (ref 0–37)
Albumin: 2.2 g/dL — ABNORMAL LOW (ref 3.5–5.2)
CO2: 23 mEq/L (ref 19–32)
Calcium: 8.2 mg/dL — ABNORMAL LOW (ref 8.4–10.5)
Chloride: 105 mEq/L (ref 96–112)
GFR calc non Af Amer: 36 mL/min — ABNORMAL LOW (ref >90)
Sodium: 140 mEq/L (ref 135–145)
Total Bilirubin: 0.6 mg/dL (ref 0.3–1.2)

## 2012-08-26 LAB — CBC
Platelets: 243 10*3/uL (ref 150–400)
RBC: 2.98 MIL/uL — ABNORMAL LOW (ref 4.22–5.81)
WBC: 9.1 10*3/uL (ref 4.0–10.5)

## 2012-08-26 NOTE — Progress Notes (Signed)
PATIENT DETAILS Name: Trevor Webb Age: 79 y.o. Sex: male Date of Birth: [birth year] Admit Date: 08/18/2012 Admitting Physician No admitting provider for patient encounter. [id number], JERRY, MD  Brief Summary: Patient is an 79 year old male with a history of atrial fibrillation, on Coumadin prior to this admission, prior history of GI bleed presumed to be a diverticular bleed presented to the hospital with fatigue. He was found to have a hemoglobin of 5.5, and was admitted for further evaluation and treatment. Coumadin was held on admission, he was given a total of 4 units of PRBC, GI was consulted, endoscopy and colonoscopy were done this admission which did not show any acute bleeding sources, colonoscopy did show diverticulosis. Long discussions were held with patient's son-[name]-given that this is his second episode of a diverticular bleed while on Coumadin, Coumadin therapy has been permanently discontinued. Patient's son is aware of the cardioembolic risks, and is agreeable to discontinue Coumadin and be placed on aspirin instead. During this hospital stay, patient's course was complicated by development of possible Herpes Zoster in the T8 dermatome area on the right side. He was planned to be discharged to a assisted living facility, however they could not accommodate his contact precautions requirements (due to shingles), therefore his discharge was delayed. Unfortunately, it was noticed he developed a low-grade fever with worsening leukocytosis during this time, no foci is evident at this time. Discharge has now been postponed, patient has been placed on IV Zosyn as of 8/5, chest x-ray, UA and blood cultures have been obtained.    Subjective: No bleeding overnight. Seems about the same to his son.  Has baseline dementia and is peasantly confused.  No other issues reported overnight   Assessment/Plan:  Leukocytosis with Low Grade Fever -8/5 planning for d/c but - rise in wbc,  creatinine and low grade fever. -suspect UTI-UA shows high Ph and crystals-await culture -Check U/A, CXR, Bld Cultures-Uc re-incubated White count now down to 8, and afebrile -Start Zosyn per pharmacy after bld cx are drawn -Restart IVF and hold lasix. -D/C put on hold for now.  SubacuteGI bleed  -supected Diverticular in etiology  - This is his second episode of diverticular bleed (while on coumadin)-suspect it is time to permanently stop coumadin and just place on ASA.  Suspect we may no longer be able to continue Coumadin. Son understanding, he is aware that we will start aspirin, he is also aware that aspirin would not provide the same amount of cardio-an embolic prophylaxis as compared to Coumadin. He is accepting and is agreeable with this strategy.  - GI was consulted, endoscopy and colonoscopy were done. Endoscopy did not demonstrate any major abnormalities, colonoscopy did not reveal any evidence of acute blood loss, however there were significant amount of diverticulosis. This time it is presumed that GI bleed was from diverticulosis.  -Hb dropped a point-monitor  Acute blood loss  - Patient's hemoglobin is stable after 4 units of PRBC transfusion. Please recheck hemoglobin and hematocrit in one week.  - Acute blood loss was from above noted etiology -Hb stable at about 8   Rpt in am   Coagulopathy  - This is secondary to Coumadin therapy. This has been discontinued. Patient was given one dose of vitamin K on admission. Please see above regarding Coumadin discussion   Acute renal failure  -ARF from admission resolved. -(8/5) Seems to have re-occurred.  Check u/a.  Start IVF, Hold lasix   Possible shingles in the right chest wall area - Started  Valtrex on 8/3, place on isolation - some lesions are starting to blister and look better.  No specific c/o of this currently  Hypertension  -BP Controlled in 140 sys range -c/w coreg 3.125, Imdur 15 daily  -Ramipril held from 8/6 2/2  to AKI -lasix held as of 8/5.  Chronic Diastolic CHF  -stable and euvolemic during this admission   BPH  -c/w Flomax 0.4 mg daily  CAD-s/p CABG  -Asa 81 mg restarted 8/5 -monitor   Hx of CVA  -stable  -restarted asa 8/5 - Coumadin discontinued - given GI bleed and severe anemia   History of paroxysmal atrial fibrillation  - Please see above regarding Coumadin discussion  - Otherwise stable this admission  - Restart aspirin 81 mg 08/24/12.   Seizure Disorder  -c/w Keppra 500 bid  Dementia  -at baseline  -c/w Aricept  10 mg daily  Disposition: Remain inpatient. PT recommends SNF, but [facility] is willing to provide additional resources Back to ALF when medically appropriate.  DVT Prophylaxis: r SCD's  Code Status: DNR  Family Communication Spoke with [name] (son) 8/7 on telephone.  Procedures:  EGD/Colonoscopy 8/1  CONSULTS:  GI   MEDICATIONS: Scheduled Meds: . antiseptic oral rinse  15 mL Mouth Rinse q12n4p  . aspirin EC  81 mg Oral Daily  . azelastine  2 spray Each Nare BID  . carvedilol  3.125 mg Oral BID WC  . chlorhexidine  15 mL Mouth Rinse BID  . cyanocobalamin  1,000 mcg Intramuscular Q30 days  . donepezil  10 mg Oral QHS  . isosorbide mononitrate  15 mg Oral Daily  . levETIRAcetam  500 mg Oral BID  . loratadine  10 mg Oral Daily  . pantoprazole  40 mg Oral BID  . piperacillin-tazobactam (ZOSYN)  IV  3.375 g Intravenous Q8H  . sertraline  25 mg Oral Daily  . tamsulosin  0.4 mg Oral QPC breakfast  . valACYclovir  1,000 mg Oral TID   Continuous Infusions: . sodium chloride 0.9 % 75 mL/hr (08/25/12 0656)   PRN Meds:.acetaminophen, nitroGLYCERIN, ondansetron (ZOFRAN) IV, ondansetron, traMADol  Antibiotics: Anti-infectives   Start     Dose/Rate Route Frequency Ordered Stop   08/24/12 1300  piperacillin-tazobactam (ZOSYN) IVPB 3.375 g     3.375 g 12.5 mL/hr over 240 Minutes Intravenous 3 times per day 08/24/12 1223     08/23/12 0000   valACYclovir (VALTREX) 1000 MG tablet     1,000 mg Oral 3 times daily 08/23/12 1228     08/22/12 1145  valACYclovir (VALTREX) tablet 1,000 mg     1,000 mg Oral 3 times daily 08/22/12 1139 08/29/12 0959       PHYSICAL EXAM: Vital signs in last 24 hours: Filed Vitals:   08/25/12 2214 08/26/12 0227 08/26/12 0628 08/26/12 1018  BP: 138/57 129/60 141/64 140/64  Pulse: 70 55 81 75  Temp: 98.4 F (36.9 C) 97.3 F (36.3 C) 97.1 F (36.2 C) 98.2 F (36.8 C)  TempSrc: Oral Oral Oral Oral  Resp: 18 18 17 20   Height:      Weight:      SpO2: 97% 98% 98% 97%    Weight change:  Filed Weights   08/19/12 2202  Weight: 73.5 kg (162 lb 0.6 oz)   Body mass index is 23.92 kg/(m^2).   Gen Exam: awake alert at baselineper sonLying in bed, smells like urine   Neck: Supple, No JVD.   Chest: B/L Clear.  No w/c/r CVS: S1  S2 Regular, no murmurs.  Abdomen: soft, BS +, non tender, non distended.  Extremities: no edema, lower extremities warm to touch. Neurologic: Non Focal.  Skin: Pink rash on right flank, dermatomal distribution   Intake/Output from previous day:  Intake/Output Summary (Last 24 hours) at 08/26/12 1305 Last data filed at 08/26/12 1610  Gross per 24 hour  Intake    480 ml  Output   1600 ml  Net  -1120 ml     LAB RESULTS: CBC  Recent Labs Lab 08/23/12 0815 08/23/12 1829 08/24/12 0900 08/25/12 0535 08/26/12 0500  WBC 11.7* 14.6* 18.5* 13.8* 9.1  HGB 10.2* 9.6* 10.7* 8.1* 8.4*  HCT 31.6* 29.5* 32.8* 24.4* 24.9*  PLT 264 230 251 240 243  MCV 84.7 84.8 85.2 83.3 83.6  MCH 27.3 27.6 27.8 27.6 28.2  MCHC 32.3 32.5 32.6 33.2 33.7  RDW 14.5 14.6 14.7 14.6 14.4    Chemistries   Recent Labs Lab 08/20/12 1218 08/24/12 0900 08/25/12 0535 08/26/12 0500  NA 142 137 136 140  K 3.6 3.8 3.5 3.4*  CL 107 100 103 105  CO2 27 23 23 23   GLUCOSE 84 122* 94 88  BUN 23 32* 38* 32*  CREATININE 1.27 1.88* 2.06* 1.68*  CALCIUM 8.1* 8.6 7.9* 8.2*    Coagulation  profile  Recent Labs Lab 08/20/12 1218  INR 1.34     MICROBIOLOGY: Recent Results (from the past 240 hour(s))  MRSA PCR SCREENING     Status: None   Collection Time    08/19/12 12:01 AM      Result Value Range Status   MRSA by PCR NEGATIVE  NEGATIVE Final   Comment:            The GeneXpert MRSA Assay (FDA     approved for NASAL specimens     only), is one component of a     comprehensive MRSA colonization     surveillance program. It is not     intended to diagnose MRSA     infection nor to guide or     monitor treatment for     MRSA infections.  URINE CULTURE     Status: None   Collection Time    08/24/12 12:06 PM      Result Value Range Status   Specimen Description URINE, CLEAN CATCH   Final   Special Requests NONE   Final   Culture  Setup Time     Final   Value: 08/24/2012 18:26     Performed at [facility] Count PENDING   Incomplete   Culture     Final   Value: Culture reincubated for better growth     Performed at [facility]   Report Status PENDING   Incomplete  CULTURE, BLOOD (ROUTINE X 2)     Status: None   Collection Time    08/24/12  3:10 PM      Result Value Range Status   Specimen Description BLOOD LEFT HAND   Final   Special Requests BOTTLES DRAWN AEROBIC ONLY 2CC   Final   Culture  Setup Time     Final   Value: 08/24/2012 22:41     Performed at [facility]   Culture     Final   Value:        BLOOD CULTURE RECEIVED NO GROWTH TO DATE CULTURE WILL BE HELD FOR 5 DAYS BEFORE ISSUING A FINAL NEGATIVE REPORT  Performed at [facility]   Report Status PENDING   Incomplete  CULTURE, BLOOD (ROUTINE X 2)     Status: None   Collection Time    08/24/12  3:20 PM      Result Value Range Status   Specimen Description BLOOD LEFT HAND   Final   Special Requests BOTTLES DRAWN AEROBIC ONLY 3CC   Final   Culture  Setup Time     Final   Value: 08/24/2012 22:40     Performed at [facility]   Culture      Final   Value:        BLOOD CULTURE RECEIVED NO GROWTH TO DATE CULTURE WILL BE HELD FOR 5 DAYS BEFORE ISSUING A FINAL NEGATIVE REPORT     Performed at [facility]   Report Status PENDING   Incomplete    RADIOLOGY STUDIES/RESULTS: Dg Chest 2 View  08/18/2012   *RADIOLOGY REPORT*  Clinical Data: Nausea  CHEST - 2 VIEW  Comparison: March 18, 2011.  Findings: Status post coronary artery bypass graft.  Stable cardiomegaly.  Right lung is clear.  Interval development small linear density in lingular region most consistent with subsegmental atelectasis.  No pleural effusion or pneumothorax is noted.  IMPRESSION: Minimal lingular subsegmental atelectasis.  Postoperative changes as described above.   Original Report Authenticated By: Lupita Raider.,  M.D.   Pleas Koch, MD [phone]  If 7PM-7AM, please contact night-coverage [email or web address] Password [email or web address] 08/26/2012, 1:05 PM   LOS: 8 days

## 2012-08-27 LAB — URINE CULTURE: Colony Count: >=100000

## 2012-08-27 MED ORDER — VALACYCLOVIR HCL 1 G PO TABS: 1000.0000 mg | ORAL_TABLET | Freq: Three times a day (TID) | ORAL | Status: DC

## 2012-08-27 NOTE — Discharge Summary (Signed)
Physician Discharge Summary  Trevor Webb WUJ:[id number] DOB: 12/30/1928 DOA: 08/18/2012  PCP: Darlina Guys, MD  Admit date: 08/18/2012 Discharge date: 08/27/2012  Time spent: 35 minutes  Recommendations for Outpatient Follow-up:  1. Get basic metabolic panel and CBC in about a week 2. Consider reimplementation of ACE inhibitor as an outpatient depending on results 3. Discuss with cardiology regarding aspirin vs. Coumadin suspect may need to be just on aspirin   Discharge Diagnoses:  Principal Problem:   GI bleed Active Problems:   Diverticulitis   Hypertension   CHF, left ventricular   Paroxysmal atrial fibrillation   Stroke   Dementia   Chronic anticoagulation   CKD (chronic kidney disease), stage II   Anemia   Small bowel diverticular disease   Discharge Condition: Fair  Diet recommendation: Heart healthy  Filed Weights   08/19/12 2202  Weight: 73.5 kg (162 lb 0.6 oz)    History of present illness:  79 year old male admitted 08/18/2012 with diverticular bleed-initially in room 5.5, received 4 units PRBC, workup included endoscopy colonoscopy-colonoscopy = diverticulosis-Coumadin held. Developed fever and herpes zoster T8 dermatome and then low-grade fever Zosyn started and culture negative.he potentially urine. See below for details  Hospital Course:  Leukocytosis with Low Grade Fever  -8/5 planning for d/c but - rise in wbc, creatinine and low grade fever.  -suspect UTI-UA shows high Ph and crystals-culture showed 3 separate bacteria above 100,000 colony-forming units-patient received 3 days IV antibiotics as below  SubacuteGI bleed  -supected Diverticular in etiology  - This is his second episode of diverticular bleed (while on coumadin)-long discussions held with family, Coumadin discontinued her aspirin-rationale explained-risks explained regardingcardioembolic phenomena - GI was consulted, endoscopy and colonoscopy were done. Endoscopy did not demonstrate  any major abnormalities, colonoscopy did not reveal any evidence of acute blood loss, however there were significant amount of diverticulosis. This time it is presumed that GI bleed was from diverticulosis.  Acute blood loss  - Patient's hemoglobin is stable after 4 units of PRBC transfusion. Please recheck hemoglobin and hematocrit in one week.  - Acute blood loss was from above noted etiology  -Hb stable at about 8 Rpt in am  Coagulopathy  - This is secondary to Coumadin therapy. This has been discontinued. Patient was given one dose of vitamin K on admission. Please see above regarding Coumadin discussion  Acute renal failure  -ARF from admission resolved.  -(8/5) Seems to have re-occurred. Check u/a. Start IVF, Hold lasix  Possible shingles in the right chest wall area  - Started Valtrex on 8/3, place on isolation-will complete therapy 08/30/11 - some lesions are starting to blister and look better. No specific c/o of this currently  Hypertension  -BP Controlled in 140 sys range  -c/w coreg 3.125, Imdur 15 daily  -Ramipril held from 8/6 2/2 to AKI  -lasix held as of 8/5.  Chronic Diastolic CHF  -stable and euvolemic during this admission  BPH  -c/w Flomax 0.4 mg daily  CAD-s/p CABG  -Asa 81 mg restarted 8/5  -monitor  Hx of CVA  -stable  -restarted asa 8/5  - Coumadin discontinued - given GI bleed and severe anemia  History of paroxysmal atrial fibrillation  - Please see above regarding Coumadin discussion  - Otherwise stable this admission  - Restart aspirin 81 mg 08/24/12.  Seizure Disorder  -c/w Keppra 500 bid  Dementia  -at baseline  -c/w Aricept 10 mg daily  Disposition:  Remain inpatient.  PT recommends SNF, but [facility] is willing to provide additional resources  Back to ALF when medically appropriate.   Procedures:  Endoscopy colonoscopy  Consultations:  Gastroenterology  Discharge Exam: Filed Vitals:   08/27/12 0702  BP: 152/67  Pulse: 75  Temp:  97.3 F (36.3 C)  Resp: 18   Pleasant alert oriented no apparent distress  General: EOMI Cardiovascular: S1-S2 no murmur rub or gallop Respiratory: Clinically clear  Discharge Instructions  Discharge Orders   Future Orders Complete By Expires     Call MD for:  extreme fatigue  As directed     Diet - low sodium heart healthy  As directed     Diet - low sodium heart healthy  As directed     Discharge instructions  As directed     Comments:      You were cared for by a hospitalist during your hospital stay. If you have any questions about your discharge medications or the care you received while you were in the hospital after you are discharged, you can call the unit and asked to speak with the hospitalist on call if the hospitalist that took care of you is not available. Once you are discharged, your primary care physician will handle any further medical issues. Please note that NO REFILLS for any discharge medications will be authorized once you are discharged, as it is imperative that you return to your primary care physician (or establish a relationship with a primary care physician if you do not have one) for your aftercare needs so that they can reassess your need for medications and monitor your lab values. If you do not have a primary care physician, you can call [phone] for a physician referral.    Increase activity slowly  As directed     Increase activity slowly  As directed         Medication List    STOP taking these medications       ramipril 5 MG capsule  Commonly known as:  ALTACE     warfarin 1 MG tablet  Commonly known as:  COUMADIN     warfarin 3 MG tablet  Commonly known as:  COUMADIN      TAKE these medications       acetaminophen 325 MG tablet  Commonly known as:  TYLENOL  Take 2 tablets (650 mg total) by mouth every 6 (six) hours as needed for pain.     aspirin EC 81 MG tablet  Take 1 tablet (81 mg total) by mouth daily.     azelastine 137  MCG/SPRAY nasal spray  Commonly known as:  ASTELIN  Place 2 sprays into the nose 2 (two) times daily.     carvedilol 3.125 MG tablet  Commonly known as:  COREG  Take 3.125 mg by mouth 2 (two) times daily with a meal.     cyanocobalamin 1000 MCG/ML injection  Commonly known as:  (VITAMIN B-12)  Inject 1,000 mcg into the muscle every 30 (thirty) days. Give on the 24th of each month     donepezil 10 MG tablet  Commonly known as:  ARICEPT  Take 10 mg by mouth at bedtime.     furosemide 40 MG tablet  Commonly known as:  LASIX  Take 40 mg by mouth daily.     isosorbide mononitrate 30 MG 24 hr tablet  Commonly known as:  IMDUR  Take 15 mg by mouth daily.     levETIRAcetam 500 MG tablet  Commonly known as:  KEPPRA  Take 500 mg by mouth 2 (two) times daily.     loratadine 10 MG tablet  Commonly known as:  CLARITIN  Take 10 mg by mouth daily.     nitroGLYCERIN 0.4 MG SL tablet  Commonly known as:  NITROSTAT  Place 0.4 mg under the tongue every 5 (five) minutes as needed for chest pain.     pantoprazole 40 MG tablet  Commonly known as:  PROTONIX  Take 1 tablet (40 mg total) by mouth daily.     polyethylene glycol packet  Commonly known as:  MIRALAX / GLYCOLAX  Take 17 g by mouth daily.     sertraline 25 MG tablet  Commonly known as:  ZOLOFT  Take 25 mg by mouth daily.     tamsulosin 0.4 MG Caps capsule  Commonly known as:  FLOMAX  Take 0.4 mg by mouth daily after breakfast.     traMADol 50 MG tablet  Commonly known as:  ULTRAM  Take 1 tablet (50 mg total) by mouth every 6 (six) hours as needed.     valACYclovir 1000 MG tablet  Commonly known as:  VALTREX  Take 1 tablet (1,000 mg total) by mouth 3 (three) times daily.       Allergies  Allergen Reactions  . Morphine And Related     Delusions       Follow-up Information   Follow up with Darlina Guys, MD. Schedule an appointment as soon as possible for a visit in 3 days.   Contact information:   [address] [place] [address] [phone]       Schedule an appointment as soon as possible for a visit with EDWARDS JR,JAMES L, MD. (As needed)    Contact information:   [address]                         [place] [address] [phone]        The results of significant diagnostics from this hospitalization (including imaging, microbiology, ancillary and laboratory) are listed below for reference.    Significant Diagnostic Studies: Dg Chest 2 View  08/18/2012   *RADIOLOGY REPORT*  Clinical Data: Nausea  CHEST - 2 VIEW  Comparison: March 18, 2011.  Findings: Status post coronary artery bypass graft.  Stable cardiomegaly.  Right lung is clear.  Interval development small linear density in lingular region most consistent with subsegmental atelectasis.  No pleural effusion or pneumothorax is noted.  IMPRESSION: Minimal lingular subsegmental atelectasis.  Postoperative changes as described above.   Original Report Authenticated By: Lupita Raider.,  M.D.   Dg Chest Port 1 View  08/25/2012   *RADIOLOGY REPORT*  Clinical Data: Fever, leukocytosis  PORTABLE CHEST - 1 VIEW  Comparison: 08/18/2012  Findings: Prior coronary bypass changes.  Heart is enlarged with vascular congestion and basilar atelectasis.  Negative CHF or pneumonia.  No effusion or pneumothorax.  Stable exam.  IMPRESSION: Cardiomegaly with vascular congestion and basilar atelectasis.  No interval change   Original Report Authenticated By: Judie Petit. Miles Costain, M.D.    Microbiology: Recent Results (from the past 240 hour(s))  MRSA PCR SCREENING     Status: None   Collection Time    08/19/12 12:01 AM      Result Value Range Status   MRSA by PCR NEGATIVE  NEGATIVE Final   Comment:            The GeneXpert  MRSA Assay (FDA     approved for NASAL specimens     only), is one component of a     comprehensive MRSA colonization     surveillance program. It is not     intended to diagnose MRSA     infection  nor to guide or     monitor treatment for     MRSA infections.  URINE CULTURE     Status: None   Collection Time    08/24/12 12:06 PM      Result Value Range Status   Specimen Description URINE, CLEAN CATCH   Final   Special Requests NONE   Final   Culture  Setup Time     Final   Value: 08/24/2012 18:26     Performed at [facility] Count     Final   Value: >=100,000 COLONIES/ML     Performed at [facility]   Culture     Final   Value: Multiple bacterial morphotypes present, none predominant. Suggest appropriate recollection if clinically indicated.     Performed at [facility]   Report Status 08/27/2012 FINAL   Final  CULTURE, BLOOD (ROUTINE X 2)     Status: None   Collection Time    08/24/12  3:10 PM      Result Value Range Status   Specimen Description BLOOD LEFT HAND   Final   Special Requests BOTTLES DRAWN AEROBIC ONLY 2CC   Final   Culture  Setup Time     Final   Value: 08/24/2012 22:41     Performed at [facility]   Culture     Final   Value:        BLOOD CULTURE RECEIVED NO GROWTH TO DATE CULTURE WILL BE HELD FOR 5 DAYS BEFORE ISSUING A FINAL NEGATIVE REPORT     Performed at [facility]   Report Status PENDING   Incomplete  CULTURE, BLOOD (ROUTINE X 2)     Status: None   Collection Time    08/24/12  3:20 PM      Result Value Range Status   Specimen Description BLOOD LEFT HAND   Final   Special Requests BOTTLES DRAWN AEROBIC ONLY 3CC   Final   Culture  Setup Time     Final   Value: 08/24/2012 22:40     Performed at [facility]   Culture     Final   Value:        BLOOD CULTURE RECEIVED NO GROWTH TO DATE CULTURE WILL BE HELD FOR 5 DAYS BEFORE ISSUING A FINAL NEGATIVE REPORT     Performed at [facility]   Report Status PENDING   Incomplete     Labs: Basic Metabolic Panel:  Recent Labs Lab 08/20/12 1218 08/24/12 0900 08/25/12 0535 08/26/12 0500  NA 142 137 136 140  K 3.6 3.8 3.5 3.4*   CL 107 100 103 105  CO2 27 23 23 23   GLUCOSE 84 122* 94 88  BUN 23 32* 38* 32*  CREATININE 1.27 1.88* 2.06* 1.68*  CALCIUM 8.1* 8.6 7.9* 8.2*   Liver Function Tests:  Recent Labs Lab 08/26/12 0500  AST 22  ALT 15  ALKPHOS 76  BILITOT 0.6  PROT 5.4*  ALBUMIN 2.2*   No results found for this basename: LIPASE, AMYLASE,  in the last 168 hours No results found for this basename: AMMONIA,  in the last 168 hours CBC:  Recent Labs Lab 08/23/12 0815 08/23/12 1829 08/24/12 0900 08/25/12 0535 08/26/12 0500  WBC 11.7* 14.6* 18.5* 13.8* 9.1  HGB 10.2* 9.6* 10.7* 8.1* 8.4*  HCT 31.6* 29.5* 32.8* 24.4* 24.9*  MCV 84.7 84.8 85.2 83.3 83.6  PLT 264 230 251 240 243   Cardiac Enzymes: No results found for this basename: CKTOTAL, CKMB, CKMBINDEX, TROPONINI,  in the last 168 hours BNP: BNP (last 3 results)  Recent Labs  03/25/12 2225  PROBNP 4380.0*   CBG: No results found for this basename: GLUCAP,  in the last 168 hours     Signed:  Rhetta Mura  [facility] 08/27/2012, 7:52 AM

## 2012-08-27 NOTE — Clinical Social Work Note (Addendum)
10:51am CSW received call from [facility] requesting further information on pt. CSW faxed over requested information via [facility] to [facility]. CSW currently awaiting approval to pt to be discharged back to [facility]. CSW to continue to follow and assist with discharge planning needs.  CSW attempted to contact pt's ALF ([facility]) to confirm ALF is able to accommodate pt's PT/OT needs. CSW faxed pt's updated clinicals on 08/26/2012 and is awaiting confirmation. CSW will continue to follow and assist with discharge planning needs.  Darlyn Chamber, MSW, LCSWA Clinical Social Work [phone]

## 2012-08-27 NOTE — Progress Notes (Signed)
Patient had 13 beats of asymptomatic Vtach. Patient denies pain, shortness of breath. Neruo checks remain at baseline. Maren Reamer NP notified. Will continue to monitor.

## 2012-08-27 NOTE — Progress Notes (Signed)
When d/c was figured out b/t SW and [facility], pt's son wanted to transport in his own car. MD okay with it. Patient had much difficulty getting to [facility], but was able to. Patient's son declined for SW to set up transportation between facilities. Patient d/c in son's vehicle to [facility] with all documents.

## 2012-08-30 LAB — CULTURE, BLOOD (ROUTINE X 2): Culture: NO GROWTH

## 2013-05-08 IMAGING — CR DG CHEST 1V PORT
1 series · 1 of 1 positions shown · non-contrast
Comparison: Chest radiograph 10/08/2010

CLINICAL DATA: Congestive heart failure

PORTABLE CHEST - 1 VIEW

[AP]
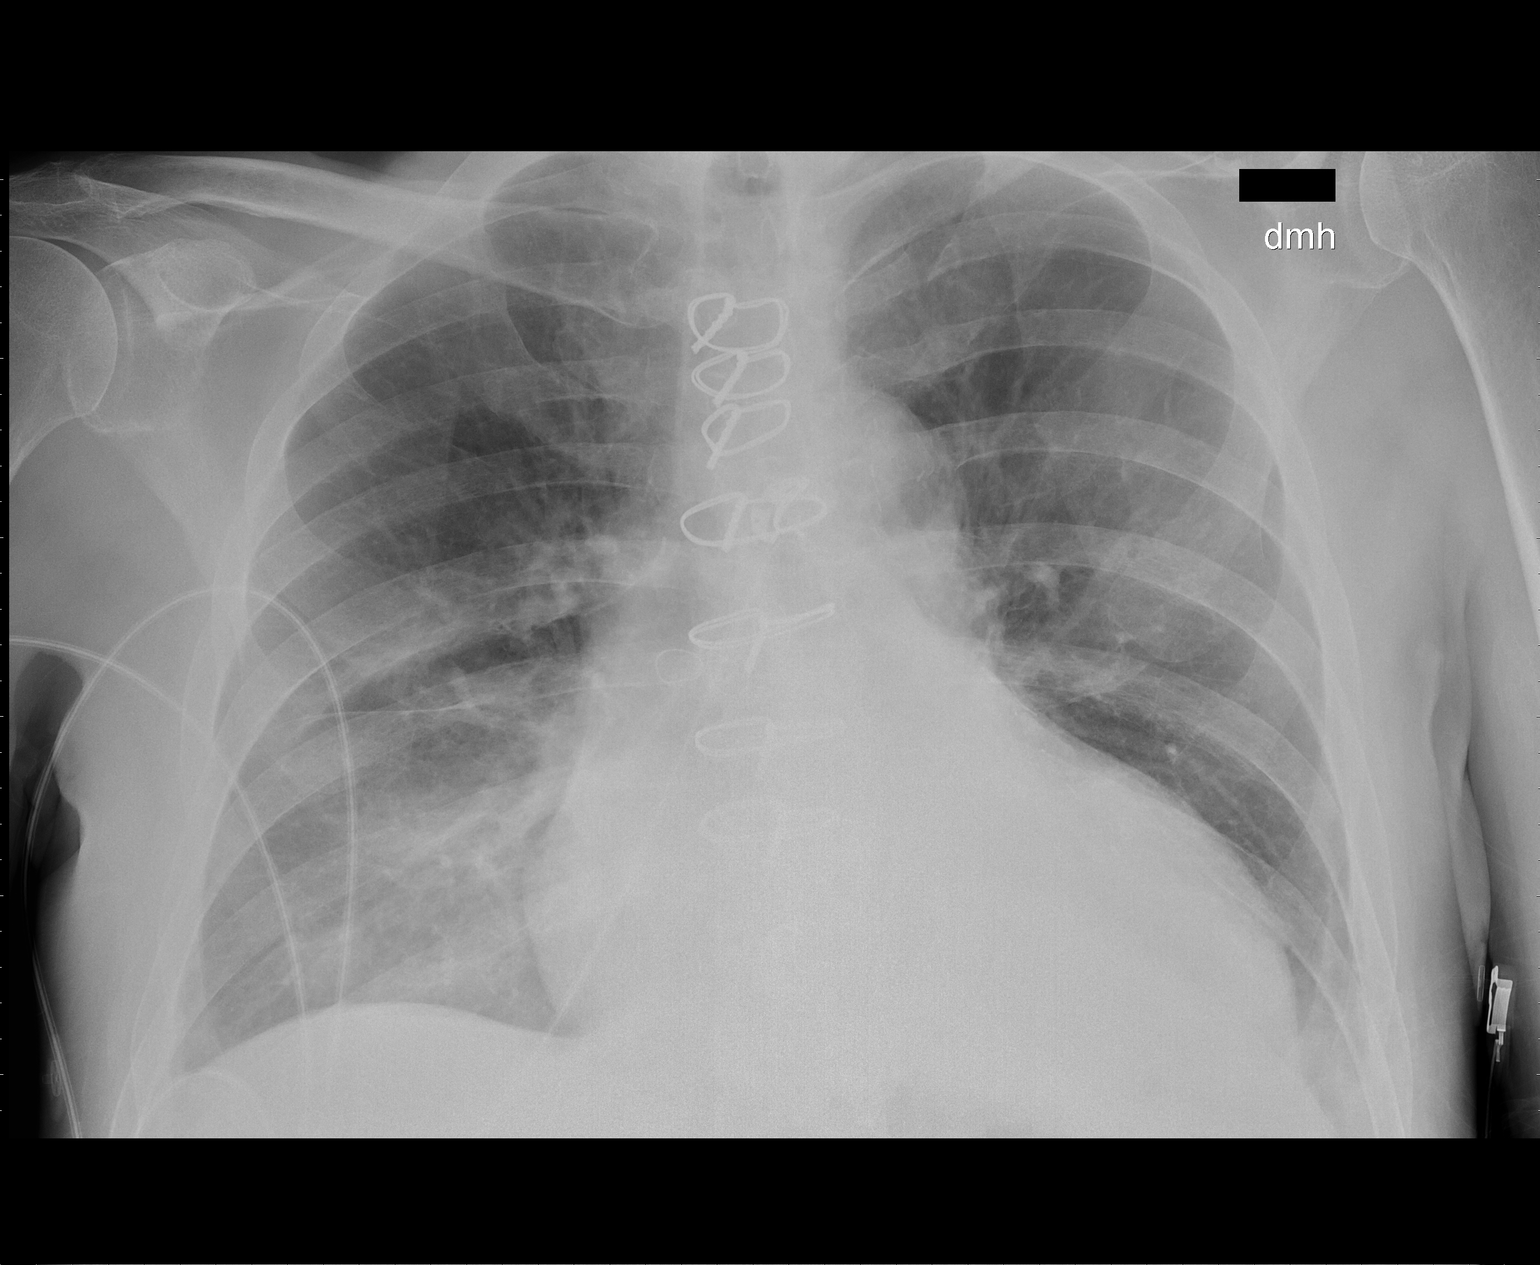

[1 of 1 positions shown; findings below may reference images not displayed]

FINDINGS: Sternotomy wires overlie stable enlarged heart
silhouette.  Slight improvement in density of air space disease at
the lung bases.  Small effusions are present.  No pneumothorax.
IMPRESSION: Improved air space disease and effusion at the lung bases.

## 2013-11-23 ENCOUNTER — Encounter (HOSPITAL_COMMUNITY): Payer: Self-pay | Admitting: Emergency Medicine

## 2013-11-23 ENCOUNTER — Emergency Department (HOSPITAL_COMMUNITY)
Admission: EM | Admit: 2013-11-23 | Discharge: 2013-11-23 | Disposition: A | Discharge status: Home / Self Care | Payer: Medicare PPO | Attending: Emergency Medicine | Admitting: Emergency Medicine

## 2013-11-23 ENCOUNTER — Emergency Department (HOSPITAL_COMMUNITY): Payer: Medicare PPO

## 2013-11-23 DIAGNOSIS — Z7982 Long term (current) use of aspirin: Secondary | ICD-10-CM | POA: Diagnosis not present

## 2013-11-23 DIAGNOSIS — I4891 Unspecified atrial fibrillation: Secondary | ICD-10-CM | POA: Diagnosis not present

## 2013-11-23 DIAGNOSIS — N390 Urinary tract infection, site not specified: Secondary | ICD-10-CM | POA: Insufficient documentation

## 2013-11-23 DIAGNOSIS — Z792 Long term (current) use of antibiotics: Secondary | ICD-10-CM | POA: Diagnosis not present

## 2013-11-23 DIAGNOSIS — I252 Old myocardial infarction: Secondary | ICD-10-CM | POA: Insufficient documentation

## 2013-11-23 DIAGNOSIS — I10 Essential (primary) hypertension: Secondary | ICD-10-CM | POA: Diagnosis not present

## 2013-11-23 DIAGNOSIS — M1990 Unspecified osteoarthritis, unspecified site: Secondary | ICD-10-CM | POA: Diagnosis not present

## 2013-11-23 DIAGNOSIS — R4182 Altered mental status, unspecified: Secondary | ICD-10-CM | POA: Insufficient documentation

## 2013-11-23 DIAGNOSIS — M6281 Muscle weakness (generalized): Secondary | ICD-10-CM | POA: Diagnosis present

## 2013-11-23 DIAGNOSIS — Z8673 Personal history of transient ischemic attack (TIA), and cerebral infarction without residual deficits: Secondary | ICD-10-CM | POA: Diagnosis not present

## 2013-11-23 DIAGNOSIS — I509 Heart failure, unspecified: Secondary | ICD-10-CM | POA: Insufficient documentation

## 2013-11-23 DIAGNOSIS — K219 Gastro-esophageal reflux disease without esophagitis: Secondary | ICD-10-CM | POA: Diagnosis not present

## 2013-11-23 DIAGNOSIS — Z79899 Other long term (current) drug therapy: Secondary | ICD-10-CM | POA: Diagnosis not present

## 2013-11-23 LAB — URINALYSIS, ROUTINE W REFLEX MICROSCOPIC
Bilirubin Urine: NEGATIVE
Glucose, UA: NEGATIVE mg/dL
Ketones, ur: NEGATIVE mg/dL
LEUKOCYTES UA: NEGATIVE
NITRITE: NEGATIVE
PH: 6 (ref 5.0–8.0)
Protein, ur: 30 mg/dL — AB
SPECIFIC GRAVITY, URINE: 1.016 (ref 1.005–1.030)
Urobilinogen, UA: 1 mg/dL (ref 0.0–1.0)

## 2013-11-23 LAB — I-STAT TROPONIN, ED: Troponin i, poc: 0 ng/mL (ref 0.00–0.08)

## 2013-11-23 LAB — CBC WITH DIFFERENTIAL/PLATELET
BASOS ABS: 0 10*3/uL (ref 0.0–0.1)
Basophils Relative: 0 % (ref 0–1)
Eosinophils Absolute: 0.1 10*3/uL (ref 0.0–0.7)
Eosinophils Relative: 1 % (ref 0–5)
HEMATOCRIT: 41.4 % (ref 39.0–52.0)
Hemoglobin: 13.5 g/dL (ref 13.0–17.0)
LYMPHS ABS: 1.3 10*3/uL (ref 0.7–4.0)
LYMPHS PCT: 12 % (ref 12–46)
MCH: 30.1 pg (ref 26.0–34.0)
MCHC: 32.6 g/dL (ref 30.0–36.0)
MCV: 92.4 fL (ref 78.0–100.0)
MONO ABS: 1.3 10*3/uL — AB (ref 0.1–1.0)
Monocytes Relative: 12 % (ref 3–12)
NEUTROS ABS: 8.4 10*3/uL — AB (ref 1.7–7.7)
Neutrophils Relative %: 75 % (ref 43–77)
PLATELETS: 164 10*3/uL (ref 150–400)
RBC: 4.48 MIL/uL (ref 4.22–5.81)
RDW: 13.2 % (ref 11.5–15.5)
WBC: 11.2 10*3/uL — AB (ref 4.0–10.5)

## 2013-11-23 LAB — BASIC METABOLIC PANEL
ANION GAP: 12 (ref 5–15)
BUN: 19 mg/dL (ref 6–23)
CHLORIDE: 102 meq/L (ref 96–112)
CO2: 28 meq/L (ref 19–32)
Calcium: 9 mg/dL (ref 8.4–10.5)
Creatinine, Ser: 1.29 mg/dL (ref 0.50–1.35)
GFR calc Af Amer: 57 mL/min — ABNORMAL LOW (ref >90)
GFR calc non Af Amer: 50 mL/min — ABNORMAL LOW (ref >90)
Glucose, Bld: 104 mg/dL — ABNORMAL HIGH (ref 70–99)
Potassium: 4 mEq/L (ref 3.7–5.3)
SODIUM: 142 meq/L (ref 137–147)

## 2013-11-23 LAB — URINE MICROSCOPIC-ADD ON

## 2013-11-23 MED ORDER — ACETAMINOPHEN 325 MG PO TABS
650.0000 mg | ORAL_TABLET | Freq: Once | ORAL | Status: AC
  Administered 2013-11-23: 650 mg via ORAL
  Filled 2013-11-23: qty 2

## 2013-11-23 MED ORDER — SODIUM CHLORIDE 0.9 % IV BOLUS (SEPSIS)
500.0000 mL | Freq: Once | INTRAVENOUS | Status: AC
  Administered 2013-11-23: 500 mL via INTRAVENOUS

## 2013-11-23 NOTE — ED Notes (Signed)
Bed: KF27 Expected date:  Expected time:  Means of arrival:  Comments: EMS- elderly fall/possible UTI

## 2013-11-23 NOTE — Discharge Instructions (Signed)
You were Evaluated today for altered mental status. There is no obvious evidence of infection. Workup is largely unremarkable including CT scan.  If symptoms worsen, he should be reevaluated.  Altered Mental Status Altered mental status most often refers to an abnormal change in your responsiveness and awareness. It can affect your speech, thought, mobility, memory, attention span, or alertness. It can range from slight confusion to complete unresponsiveness (coma). Altered mental status can be a sign of a serious underlying medical condition. Rapid evaluation and medical treatment is necessary for patients having an altered mental status. CAUSES   Low blood sugar (hypoglycemia) or diabetes.  Severe loss of body fluids (dehydration) or a body salt (electrolyte) imbalance.  A stroke or other neurologic problem, such as dementia or delirium.  A head injury or tumor.  A drug or alcohol overdose.  Exposure to toxins or poisons.  Depression, anxiety, and stress.  A low oxygen level (hypoxia).  An infection.  Blood loss.  Twitching or shaking (seizure).  Heart problems, such as heart attack or heart rhythm problems (arrhythmias).  A body temperature that is too low or too high (hypothermia or hyperthermia). DIAGNOSIS  A diagnosis is based on your history, symptoms, physical and neurologic examinations, and diagnostic tests. Diagnostic tests may include:  Measurement of your blood pressure, pulse, breathing, and oxygen levels (vital signs).  Blood tests.  Urine tests.  X-ray exams.  A computerized magnetic scan (magnetic resonance imaging, MRI).  A computerized X-ray scan (computed tomography, CT scan). TREATMENT  Treatment will depend on the cause. Treatment may include:  Management of an underlying medical or mental health condition.  Critical care or support in the hospital. HOME CARE INSTRUCTIONS   Only take over-the-counter or prescription medicines for pain,  discomfort, or fever as directed by your caregiver.  Manage underlying conditions as directed by your caregiver.  Eat a healthy, well-balanced diet to maintain strength.  Join a support group or prevention program to cope with the condition or trauma that caused the altered mental status. Ask your caregiver to help choose a program that works for you.  Follow up with your caregiver for further examination, therapy, or testing as directed. SEEK MEDICAL CARE IF:   You feel unwell or have chills.  You or your family notice a change in your behavior or your alertness.  You have trouble following your caregiver's treatment plan.  You have questions or concerns. SEEK IMMEDIATE MEDICAL CARE IF:   You have a rapid heartbeat or have chest pain.  You have difficulty breathing.  You have a fever.  You have a headache with a stiff neck.  You cough up blood.  You have blood in your urine or stool.  You have severe agitation or confusion. MAKE SURE YOU:   Understand these instructions.  Will watch your condition.  Will get help right away if you are not doing well or get worse. Document Released: 06/26/2009 Document Revised: 03/31/2011 Document Reviewed: 06/26/2009 [facility] Patient Information 2015 [place], [place]. This information is not intended to replace advice given to you by your health care provider. Make sure you discuss any questions you have with your health care provider.

## 2013-11-23 NOTE — ED Provider Notes (Signed)
CSN: [id number]     Arrival date & time 11/23/13  1000 History   First MD Initiated Contact with Patient 11/23/13 1000     Chief Complaint  Patient presents with  . Urinary Tract Infection     (Consider location/radiation/quality/duration/timing/severity/associated sxs/prior Treatment) HPI  This is an 80 year old male with a history of hypertension, CHF, paroxysmal A. Fib, heart failure, dementia who presents from his living facility with generalized weakness and concerns for UTI. Per staffing at the facility, patient has not been as alert or active as normal. He normally gets around in a wheelchair but has been very slow. He has not fallen. He has not noted any fevers or focal weakness.  The patient is only oriented to himself and noncontributory to history taking.  Level V caveat for dementia  Patient's son is now at the bedside.  He states that from a MS standpoint, patient is at his baseline as only being oriented to himself.  However, he is concern because patient's activity level and general disposition has changed "dramatically" over the last 2 days.  He has not noted any focal weakness or numbness.  He reports that "my dad may not know who he is talking to but he's always riding his wheelchair up and down the hall and saying hi and he doesn't seem to have the energy to do that."  Per son, patient is DNR/DNI.  Past Medical History  Diagnosis Date  . Hypertension   . CHF, left ventricular   . Paroxysmal atrial fibrillation   . CHF (congestive heart failure)   . Angina   . Myocardial infarction 2009  . Stroke 2011    residual:  "right sided weakness; problems walking; dementia"  . Dementia     S/P CVA 2011  . Arthritis   . Dysphasia   . A-fib   . GERD (gastroesophageal reflux disease)   . Alzheimer's dementia   . Seizures     epilepsy  . Diverticulitis   . Abdominal abscess    Past Surgical History  Procedure Laterality Date  . Coronary artery bypass graft  2009     CABG X4  . Knee arthroscopy      multiple; bilaterally  . Total knee arthroplasty      bilateral  . Total hip arthroplasty      right  . Joint replacement      bilateral knees; right hip  . Colon surgery    . Colon resection  1980's    "had colostomy bag for awhile; went back in and put it all back together"  . Colostomy takedown  1980's  . Esophagogastroduodenoscopy N/A 08/20/2012    Procedure: ESOPHAGOGASTRODUODENOSCOPY (EGD);  Surgeon: Vertell Novak., MD;  Location: [facility] ENDOSCOPY;  Service: Endoscopy;  Laterality: N/A;  . Colonoscopy N/A 08/20/2012    Procedure: COLONOSCOPY;  Surgeon: Vertell Novak., MD;  Location: [facility] ENDOSCOPY;  Service: Endoscopy;  Laterality: N/A;   Family History  Problem Relation Age of Onset  . Stroke Mother   . Heart attack Father    History  Substance Use Topics  . Smoking status: Never Smoker   . Smokeless tobacco: Never Used  . Alcohol Use: No    Review of Systems  Unable to perform ROS: Dementia      Allergies  Morphine and related  Home Medications   Prior to Admission medications   Medication Sig Start Date End Date Taking? Authorizing Provider  acetaminophen (TYLENOL) 325 MG tablet Take 2  tablets (650 mg total) by mouth every 6 (six) hours as needed for pain. 08/23/12  Yes Shanker Levora Dredge, MD  aspirin EC 81 MG tablet Take 1 tablet (81 mg total) by mouth daily. 08/23/12  Yes Shanker Levora Dredge, MD  azelastine (ASTELIN) 137 MCG/SPRAY nasal spray Place 2 sprays into the nose 2 (two) times daily as needed for allergies.    Yes Historical Provider, MD  carvedilol (COREG) 3.125 MG tablet Take 3.125 mg by mouth 2 (two) times daily with a meal.   Yes Historical Provider, MD  ciprofloxacin (CIPRO) 250 MG tablet Take 250 mg by mouth 2 (two) times daily. For ten days, to stop 12/02/13   Yes Historical Provider, MD  cyanocobalamin (,VITAMIN B-12,) 1000 MCG/ML injection Inject 1,000 mcg into the muscle every 30 (thirty) days. Give on the 24th of  each month   Yes Historical Provider, MD  docusate sodium (COLACE) 100 MG capsule Take 100 mg by mouth daily.   Yes Historical Provider, MD  donepezil (ARICEPT) 10 MG tablet Take 10 mg by mouth at bedtime.   Yes Historical Provider, MD  ferrous sulfate 325 (65 FE) MG tablet Take 325 mg by mouth 2 (two) times daily.   Yes Historical Provider, MD  furosemide (LASIX) 40 MG tablet Take 40 mg by mouth daily.   Yes Historical Provider, MD  isosorbide mononitrate (IMDUR) 30 MG 24 hr tablet Take 15 mg by mouth daily.   Yes Historical Provider, MD  levETIRAcetam (KEPPRA) 500 MG tablet Take 500 mg by mouth 2 (two) times daily.   Yes Historical Provider, MD  loratadine (CLARITIN) 10 MG tablet Take 10 mg by mouth daily.   Yes Historical Provider, MD  nitroGLYCERIN (NITROSTAT) 0.4 MG SL tablet Place 0.4 mg under the tongue every 5 (five) minutes as needed for chest pain.    Yes Historical Provider, MD  polyethylene glycol (MIRALAX / GLYCOLAX) packet Take 17 g by mouth daily.   Yes Historical Provider, MD  saccharomyces boulardii (FLORASTOR) 250 MG capsule Take 250 mg by mouth daily.   Yes Historical Provider, MD  sertraline (ZOLOFT) 25 MG tablet Take 12.5 mg by mouth daily.    Yes Historical Provider, MD  traMADol (ULTRAM) 50 MG tablet Take 1 tablet (50 mg total) by mouth every 6 (six) hours as needed. 08/23/12  Yes Shanker Levora Dredge, MD  isosorbide dinitrate (ISORDIL) 30 MG tablet Take 15 mg by mouth daily.    Historical Provider, MD  pantoprazole (PROTONIX) 40 MG tablet Take 1 tablet (40 mg total) by mouth daily. 03/30/12   Richarda Overlie, MD  Tamsulosin HCl (FLOMAX) 0.4 MG CAPS Take 0.4 mg by mouth daily after breakfast.     Historical Provider, MD  valACYclovir (VALTREX) 1000 MG tablet Take 1 tablet (1,000 mg total) by mouth 3 (three) times daily. 08/27/12   Rhetta Mura, MD   BP 120/73 mmHg  Pulse 65  Temp(Src) 97.7 F (36.5 C) (Oral)  Resp 20  SpO2 100% Physical Exam  Constitutional: No distress.   elderly  HENT:  Head: Normocephalic and atraumatic.  mucus membranes dry  Eyes: Pupils are equal, round, and reactive to light.  Cardiovascular: Normal rate and normal heart sounds.   No murmur heard. Irregular rhythm  Pulmonary/Chest: Effort normal and breath sounds normal. No respiratory distress. He has no wheezes.  Midline sternotomy scar  Abdominal: Soft. Bowel sounds are normal. There is no tenderness. There is no rebound.  Musculoskeletal: He exhibits no edema.  Neurological: He  is alert.  Oriented 2, equal grip strength bilaterally, spontaneously moves all 4 extremities, follows simple commands  Skin: Skin is warm and dry.  Psychiatric: He has a normal mood and affect.  Nursing note and vitals reviewed.   ED Course  Procedures (including critical care time) Labs Review Labs Reviewed  CBC WITH DIFFERENTIAL - Abnormal; Notable for the following:    WBC 11.2 (*)    Neutro Abs 8.4 (*)    Monocytes Absolute 1.3 (*)    All other components within normal limits  BASIC METABOLIC PANEL - Abnormal; Notable for the following:    Glucose, Bld 104 (*)    GFR calc non Af Amer 50 (*)    GFR calc Af Amer 57 (*)    All other components within normal limits  URINALYSIS, ROUTINE W REFLEX MICROSCOPIC - Abnormal; Notable for the following:    Hgb urine dipstick SMALL (*)    Protein, ur 30 (*)    All other components within normal limits  URINE MICROSCOPIC-ADD ON  Rosezena SensorI-STAT TROPOININ, ED  Rosezena SensorI-STAT TROPOININ, ED    Imaging Review Dg Chest 2 View  11/23/2013   CLINICAL DATA:  Weakness, urinary tract infection  EXAM: CHEST  2 VIEW  COMPARISON:  Radiograph 08/25/2012  FINDINGS: Sternotomy wires overlie stable enlarged cardiac silhouette. There is mild central venous congestion. No overt pulmonary edema. Trace pleural effusion noted. Low lung volumes.  IMPRESSION: Central venous congestion and trace effusions.   Electronically Signed   By: Genevive BiStewart  Edmunds M.D.   On: 11/23/2013 13:32   Ct  Head Wo Contrast  11/23/2013   CLINICAL DATA:  80 year old male with altered mental status, agitation. Initial encounter.  EXAM: CT HEAD WITHOUT CONTRAST  TECHNIQUE: Contiguous axial images were obtained from the base of the skull through the vertex without intravenous contrast.  COMPARISON:  Head CT without contrast 03/17/2011. [facility] brain [facility] 07/17/2011.  FINDINGS: Study is repeat degraded by motion artifact despite repeated imaging attempts. No acute osseous abnormality identified. Visualized paranasal sinuses and mastoids are clear. No acute orbit or scalp soft tissue findings identified. Mild Calcified atherosclerosis at the skull base.  Stable cerebral volume. Chronic left superior frontal gyrus encephalomalacia. No midline shift, mass effect, or evidence of intracranial mass lesion. No ventriculomegaly. There is a smaller focus of right parietal lobe encephalomalacia which is new from prior studies but appears chronic (series 6, image 21). Elsewhere grossly stable gray-white matter differentiation when allowing for motion. No suspicious intracranial vascular hyperdensity. No acute intracranial hemorrhage identified.  IMPRESSION: 1.  No acute intracranial abnormality identified. 2. Chronic MCA territory ischemia with progression on the right suspected since 2013.   Electronically Signed   By: Augusto GambleLee  Hall M.D.   On: 11/23/2013 14:41     EKG Interpretation   Date/Time:  Wednesday November 23 2013 10:14:56 EST Ventricular Rate:  57 PR Interval:    QRS Duration: 156 QT Interval:  463 QTC Calculation: 451 R Axis:   -64 Text Interpretation:  Atrial fibrillation RBBB and LAFB Similar to prior  Confirmed by HORTON  MD, COURTNEY ([id number]) on 11/23/2013 10:44:13 AM      MDM   Final diagnoses:  Altered mental status    Patient presents with AMS and generalized weakness from nursing facility.  Patient is afebrile.  VS reassuring.  No new focal deficits noted.  Paitent appears  somewhat dry.  Patient given fluids.  Work-up including infectious w/u with urine and CXR neg.  EKG with  rate controlled atrial fib.  MIld leukocytosis.  Ct head neg.    DIscussed with son negative w/u.  At this time, cause of patient's generalized weakness and change in behavior is unknown.  From a MS standpoint, patient at his baseline orientation.  DOes have dementia which can wax and wane.  Advised son to monitor patient closely over the next 24-48 hrs. If he notes focal weakness, infectious symptoms or general worsening of the patient's condition, patient should be re-evaluated.  Son stated understanding.  After history, exam, and medical workup I feel the patient has been appropriately medically screened and is safe for discharge home. Pertinent diagnoses were discussed with the patient. Patient was given return precautions.   Shon Baton, MD 11/24/13 [phone]

## 2013-11-23 NOTE — ED Notes (Signed)
Per EMS-pt from [facility] [place] with c/o of weakness, possible UTI per staff. Normally is in wheelchair. Hx of dementia.

## 2013-12-01 ENCOUNTER — Encounter (HOSPITAL_COMMUNITY): Payer: Self-pay | Admitting: Emergency Medicine

## 2013-12-01 ENCOUNTER — Emergency Department (HOSPITAL_COMMUNITY)
Admission: EM | Admit: 2013-12-01 | Discharge: 2013-12-01 | Disposition: A | Discharge status: Home / Self Care | Payer: Medicare PPO | Attending: Emergency Medicine | Admitting: Emergency Medicine

## 2013-12-01 DIAGNOSIS — G309 Alzheimer's disease, unspecified: Secondary | ICD-10-CM | POA: Diagnosis not present

## 2013-12-01 DIAGNOSIS — Z951 Presence of aortocoronary bypass graft: Secondary | ICD-10-CM | POA: Diagnosis not present

## 2013-12-01 DIAGNOSIS — R509 Fever, unspecified: Secondary | ICD-10-CM | POA: Diagnosis present

## 2013-12-01 DIAGNOSIS — I4891 Unspecified atrial fibrillation: Secondary | ICD-10-CM | POA: Diagnosis not present

## 2013-12-01 DIAGNOSIS — I209 Angina pectoris, unspecified: Secondary | ICD-10-CM | POA: Diagnosis not present

## 2013-12-01 DIAGNOSIS — I501 Left ventricular failure: Secondary | ICD-10-CM | POA: Diagnosis not present

## 2013-12-01 DIAGNOSIS — Z7982 Long term (current) use of aspirin: Secondary | ICD-10-CM | POA: Insufficient documentation

## 2013-12-01 DIAGNOSIS — I10 Essential (primary) hypertension: Secondary | ICD-10-CM | POA: Insufficient documentation

## 2013-12-01 DIAGNOSIS — I252 Old myocardial infarction: Secondary | ICD-10-CM | POA: Insufficient documentation

## 2013-12-01 DIAGNOSIS — M1990 Unspecified osteoarthritis, unspecified site: Secondary | ICD-10-CM | POA: Insufficient documentation

## 2013-12-01 DIAGNOSIS — Z8673 Personal history of transient ischemic attack (TIA), and cerebral infarction without residual deficits: Secondary | ICD-10-CM | POA: Insufficient documentation

## 2013-12-01 DIAGNOSIS — Z792 Long term (current) use of antibiotics: Secondary | ICD-10-CM | POA: Insufficient documentation

## 2013-12-01 DIAGNOSIS — K219 Gastro-esophageal reflux disease without esophagitis: Secondary | ICD-10-CM | POA: Diagnosis not present

## 2013-12-01 DIAGNOSIS — G40909 Epilepsy, unspecified, not intractable, without status epilepticus: Secondary | ICD-10-CM | POA: Insufficient documentation

## 2013-12-01 DIAGNOSIS — F0280 Dementia in other diseases classified elsewhere without behavioral disturbance: Secondary | ICD-10-CM | POA: Insufficient documentation

## 2013-12-01 DIAGNOSIS — Z87440 Personal history of urinary (tract) infections: Secondary | ICD-10-CM | POA: Insufficient documentation

## 2013-12-01 DIAGNOSIS — L03114 Cellulitis of left upper limb: Secondary | ICD-10-CM | POA: Diagnosis not present

## 2013-12-01 DIAGNOSIS — Z79899 Other long term (current) drug therapy: Secondary | ICD-10-CM | POA: Insufficient documentation

## 2013-12-01 LAB — BASIC METABOLIC PANEL
ANION GAP: 13 (ref 5–15)
BUN: 19 mg/dL (ref 6–23)
CALCIUM: 8.5 mg/dL (ref 8.4–10.5)
CHLORIDE: 101 meq/L (ref 96–112)
CO2: 25 mEq/L (ref 19–32)
CREATININE: 1.31 mg/dL (ref 0.50–1.35)
GFR calc non Af Amer: 49 mL/min — ABNORMAL LOW (ref >90)
GFR, EST AFRICAN AMERICAN: 56 mL/min — AB (ref >90)
Glucose, Bld: 122 mg/dL — ABNORMAL HIGH (ref 70–99)
Potassium: 3.5 mEq/L — ABNORMAL LOW (ref 3.7–5.3)
Sodium: 139 mEq/L (ref 137–147)

## 2013-12-01 LAB — CBC WITH DIFFERENTIAL/PLATELET
BASOS PCT: 0 % (ref 0–1)
Basophils Absolute: 0.1 10*3/uL (ref 0.0–0.1)
EOS PCT: 0 % (ref 0–5)
Eosinophils Absolute: 0 10*3/uL (ref 0.0–0.7)
HEMATOCRIT: 34.4 % — AB (ref 39.0–52.0)
Hemoglobin: 11.3 g/dL — ABNORMAL LOW (ref 13.0–17.0)
Lymphocytes Relative: 9 % — ABNORMAL LOW (ref 12–46)
Lymphs Abs: 1 10*3/uL (ref 0.7–4.0)
MCH: 29.8 pg (ref 26.0–34.0)
MCHC: 32.8 g/dL (ref 30.0–36.0)
MCV: 90.8 fL (ref 78.0–100.0)
MONO ABS: 1.5 10*3/uL — AB (ref 0.1–1.0)
Monocytes Relative: 13 % — ABNORMAL HIGH (ref 3–12)
NEUTROS ABS: 8.7 10*3/uL — AB (ref 1.7–7.7)
Neutrophils Relative %: 78 % — ABNORMAL HIGH (ref 43–77)
Platelets: 326 10*3/uL (ref 150–400)
RBC: 3.79 MIL/uL — ABNORMAL LOW (ref 4.22–5.81)
RDW: 12.9 % (ref 11.5–15.5)
WBC: 11.3 10*3/uL — ABNORMAL HIGH (ref 4.0–10.5)

## 2013-12-01 MED ORDER — CEPHALEXIN 500 MG PO CAPS: 500.0000 mg | ORAL_CAPSULE | Freq: Three times a day (TID) | ORAL | Status: DC

## 2013-12-01 MED ORDER — DEXTROSE 5 % IV SOLN
2.0000 g | Freq: Once | INTRAVENOUS | Status: AC
  Administered 2013-12-01: 2 g via INTRAVENOUS
  Filled 2013-12-01: qty 2

## 2013-12-01 NOTE — ED Notes (Signed)
Bed: WA06 Expected date:  Expected time:  Means of arrival:  Comments: ems 

## 2013-12-01 NOTE — ED Provider Notes (Signed)
CSN: [id number]     Arrival date & time 12/01/13  1410 History   First MD Initiated Contact with Patient 12/01/13 1500     Chief Complaint  Patient presents with  . Fever  . Hand Problem      HPI  Patient returns for evaluation of left hand redness. No injury. And his redness and swelling on his left pinky and ring finger. No injury.  finishing Cipro for recent urinary tract infection.  Past Medical History  Diagnosis Date  . Hypertension   . CHF, left ventricular   . Paroxysmal atrial fibrillation   . CHF (congestive heart failure)   . Angina   . Myocardial infarction 2009  . Stroke 2011    residual:  "right sided weakness; problems walking; dementia"  . Dementia     S/P CVA 2011  . Arthritis   . Dysphasia   . A-fib   . GERD (gastroesophageal reflux disease)   . Alzheimer's dementia   . Seizures     epilepsy  . Diverticulitis   . Abdominal abscess    Past Surgical History  Procedure Laterality Date  . Coronary artery bypass graft  2009    CABG X4  . Knee arthroscopy      multiple; bilaterally  . Total knee arthroplasty      bilateral  . Total hip arthroplasty      right  . Joint replacement      bilateral knees; right hip  . Colon surgery    . Colon resection  1980's    "had colostomy bag for awhile; went back in and put it all back together"  . Colostomy takedown  1980's  . Esophagogastroduodenoscopy N/A 08/20/2012    Procedure: ESOPHAGOGASTRODUODENOSCOPY (EGD);  Surgeon: Vertell NovakJames L Edwards Jr., MD;  [facility];  Service: Endoscopy;  Laterality: N/A;  . Colonoscopy N/A 08/20/2012    Procedure: COLONOSCOPY;  Surgeon: Vertell NovakJames L Edwards Jr., MD;  [facility];  Service: Endoscopy;  Laterality: N/A;   Family History  Problem Relation Age of Onset  . Stroke Mother   . Heart attack Father    History  Substance Use Topics  . Smoking status: Never Smoker   . Smokeless tobacco: Never Used  . Alcohol Use: No    Review of Systems  Unable to  perform ROS: Dementia      Allergies  Morphine and related  Home Medications   Prior to Admission medications   Medication Sig Start Date End Date Taking? Authorizing Provider  aspirin EC 81 MG tablet Take 1 tablet (81 mg total) by mouth daily. 08/23/12  Yes Shanker Levora DredgeM Ghimire, MD  carvedilol (COREG) 3.125 MG tablet Take 3.125 mg by mouth 2 (two) times daily with a meal.   Yes Historical Provider, MD  ciprofloxacin (CIPRO) 250 MG tablet Take 250 mg by mouth 2 (two) times daily. For ten days, to stop 12/02/13   Yes Historical Provider, MD  cyanocobalamin (,VITAMIN B-12,) 1000 MCG/ML injection Inject 1,000 mcg into the muscle every 30 (thirty) days. Give on the 24th of each month   Yes Historical Provider, MD  docusate sodium (COLACE) 100 MG capsule Take 100 mg by mouth daily.   Yes Historical Provider, MD  donepezil (ARICEPT) 10 MG tablet Take 10 mg by mouth at bedtime.   Yes Historical Provider, MD  ferrous sulfate 325 (65 FE) MG tablet Take 325 mg by mouth 2 (two) times daily.   Yes Historical Provider, MD  furosemide (LASIX) 40  MG tablet Take 40 mg by mouth daily.   Yes Historical Provider, MD  isosorbide mononitrate (IMDUR) 30 MG 24 hr tablet Take 15 mg by mouth daily.   Yes Historical Provider, MD  levETIRAcetam (KEPPRA) 500 MG tablet Take 500 mg by mouth 2 (two) times daily.   Yes Historical Provider, MD  loratadine (CLARITIN) 10 MG tablet Take 10 mg by mouth daily.   Yes Historical Provider, MD  polyethylene glycol (MIRALAX / GLYCOLAX) packet Take 17 g by mouth daily.   Yes Historical Provider, MD  saccharomyces boulardii (FLORASTOR) 250 MG capsule Take 250 mg by mouth daily.   Yes Historical Provider, MD  sertraline (ZOLOFT) 25 MG tablet Take 12.5 mg by mouth daily.    Yes Historical Provider, MD  acetaminophen (TYLENOL) 325 MG tablet Take 2 tablets (650 mg total) by mouth every 6 (six) hours as needed for pain. 08/23/12   Shanker Levora Dredge, MD  azelastine (ASTELIN) 137 MCG/SPRAY nasal  spray Place 2 sprays into the nose 2 (two) times daily as needed for allergies.     Historical Provider, MD  cephALEXin (KEFLEX) 500 MG capsule Take 1 capsule (500 mg total) by mouth 3 (three) times daily. 12/01/13   Rolland Porter, MD  isosorbide dinitrate (ISORDIL) 30 MG tablet Take 15 mg by mouth daily.    Historical Provider, MD  nitroGLYCERIN (NITROSTAT) 0.4 MG SL tablet Place 0.4 mg under the tongue every 5 (five) minutes as needed for chest pain.     Historical Provider, MD  pantoprazole (PROTONIX) 40 MG tablet Take 1 tablet (40 mg total) by mouth daily. 03/30/12   Richarda Overlie, MD  Tamsulosin HCl (FLOMAX) 0.4 MG CAPS Take 0.4 mg by mouth daily after breakfast.     Historical Provider, MD  traMADol (ULTRAM) 50 MG tablet Take 1 tablet (50 mg total) by mouth every 6 (six) hours as needed. 08/23/12   Shanker Levora Dredge, MD  valACYclovir (VALTREX) 1000 MG tablet Take 1 tablet (1,000 mg total) by mouth 3 (three) times daily. 08/27/12   Rhetta Mura, MD   BP 116/68 mmHg  Pulse 62  Temp(Src) 97.4 F (36.3 C) (Oral)  Resp 18  SpO2 96% Physical Exam  Constitutional: He appears well-developed and well-nourished. No distress.  HENT:  Head: Normocephalic.  Eyes: Conjunctivae are normal. Pupils are equal, round, and reactive to light. No scleral icterus.  Neck: Normal range of motion. Neck supple. No thyromegaly present.  Cardiovascular: Normal rate and regular rhythm.  Exam reveals no gallop and no friction rub.   No murmur heard. Pulmonary/Chest: Effort normal and breath sounds normal. No respiratory distress. He has no wheezes. He has no rales.  Abdominal: Soft. Bowel sounds are normal. He exhibits no distension. There is no tenderness. There is no rebound.  Musculoskeletal: Normal range of motion.       Hands: Neurological: He is alert.  Dementia. Moving all 4 extremities. Nonfocal.  Skin: Skin is warm and dry. No rash noted.  Psychiatric: He has a normal mood and affect. His behavior is  normal.    ED Course  Procedures (including critical care time) Labs Review Labs Reviewed  CBC WITH DIFFERENTIAL - Abnormal; Notable for the following:    WBC 11.3 (*)    RBC 3.79 (*)    Hemoglobin 11.3 (*)    HCT 34.4 (*)    Neutrophils Relative % 78 (*)    Neutro Abs 8.7 (*)    Lymphocytes Relative 9 (*)    Monocytes  Relative 13 (*)    Monocytes Absolute 1.5 (*)    All other components within normal limits  BASIC METABOLIC PANEL - Abnormal; Notable for the following:    Potassium 3.5 (*)    Glucose, Bld 122 (*)    GFR calc non Af Amer 49 (*)    GFR calc Af Amer 56 (*)    All other components within normal limits    Imaging Review No results found.   EKG Interpretation None      MDM   Final diagnoses:  Cellulitis of left upper extremity    Not painful. Not deformed. No crepitus. Temp of 100.4. Reassuring labs. Given IV Rocephin. Plan will be discharge. Keflex. Primary care follow-up. Return if any changes.    Rolland Porter, MD 12/01/13 1755

## 2013-12-01 NOTE — ED Notes (Signed)
Per EMS pt from [facility] presenting with left hand swelling. Pt radial pulses moderate bilaterally. Pt skin warmer to touch on left hand versus right hand. Pt given 500 mg of tylenol at 13:22 for temperature of 100.8. Pt denies pain at present time. Pt hx of dementia and baseline per facility.

## 2013-12-01 NOTE — Discharge Instructions (Signed)
Take last dose of Cipro tomorrow. Follow-up with her primary care physician at your facility. Take the Keflex as prescribed.  Cellulitis Cellulitis is an infection of the skin and the tissue under the skin. The infected area is usually red and tender. This happens most often in the arms and lower legs. HOME CARE   Take your antibiotic medicine as told. Finish the medicine even if you start to feel better.  Keep the infected arm or leg raised (elevated).  Put a warm cloth on the area up to 4 times per day.  Only take medicines as told by your doctor.  Keep all doctor visits as told. GET HELP IF:  You see red streaks on the skin coming from the infected area.  Your red area gets bigger or turns a dark color.  Your bone or joint under the infected area is painful after the skin heals.  Your infection comes back in the same area or different area.  You have a puffy (swollen) bump in the infected area.  You have new symptoms.  You have a fever. GET HELP RIGHT AWAY IF:   You feel very sleepy.  You throw up (vomit) or have watery poop (diarrhea).  You feel sick and have muscle aches and pains. MAKE SURE YOU:   Understand these instructions.  Will watch your condition.  Will get help right away if you are not doing well or get worse. Document Released: 06/25/2007 Document Revised: 05/23/2013 Document Reviewed: 03/24/2011 [facility] Patient Information 2015 [place], [place]. This information is not intended to replace advice given to you by your health care provider. Make sure you discuss any questions you have with your health care provider.

## 2014-04-14 ENCOUNTER — Emergency Department (HOSPITAL_COMMUNITY): Payer: Medicare PPO

## 2014-04-14 ENCOUNTER — Inpatient Hospital Stay (HOSPITAL_COMMUNITY)
Admission: EM | Admit: 2014-04-14 | Discharge: 2014-04-18 | DRG: 066 | Disposition: A | Discharge status: Skilled Nursing Facility | Payer: Medicare PPO | Attending: Neurology | Admitting: Neurology

## 2014-04-14 ENCOUNTER — Encounter (HOSPITAL_COMMUNITY): Payer: Self-pay

## 2014-04-14 DIAGNOSIS — G40909 Epilepsy, unspecified, not intractable, without status epilepticus: Secondary | ICD-10-CM | POA: Diagnosis present

## 2014-04-14 DIAGNOSIS — I480 Paroxysmal atrial fibrillation: Secondary | ICD-10-CM | POA: Diagnosis not present

## 2014-04-14 DIAGNOSIS — Z7901 Long term (current) use of anticoagulants: Secondary | ICD-10-CM | POA: Diagnosis not present

## 2014-04-14 DIAGNOSIS — I482 Chronic atrial fibrillation: Secondary | ICD-10-CM | POA: Diagnosis present

## 2014-04-14 DIAGNOSIS — Z96653 Presence of artificial knee joint, bilateral: Secondary | ICD-10-CM | POA: Diagnosis present

## 2014-04-14 DIAGNOSIS — G309 Alzheimer's disease, unspecified: Secondary | ICD-10-CM | POA: Diagnosis present

## 2014-04-14 DIAGNOSIS — I252 Old myocardial infarction: Secondary | ICD-10-CM

## 2014-04-14 DIAGNOSIS — F0390 Unspecified dementia without behavioral disturbance: Secondary | ICD-10-CM | POA: Diagnosis not present

## 2014-04-14 DIAGNOSIS — Z7982 Long term (current) use of aspirin: Secondary | ICD-10-CM

## 2014-04-14 DIAGNOSIS — I639 Cerebral infarction, unspecified: Secondary | ICD-10-CM

## 2014-04-14 DIAGNOSIS — I63511 Cerebral infarction due to unspecified occlusion or stenosis of right middle cerebral artery: Secondary | ICD-10-CM | POA: Diagnosis present

## 2014-04-14 DIAGNOSIS — I272 Other secondary pulmonary hypertension: Secondary | ICD-10-CM | POA: Diagnosis present

## 2014-04-14 DIAGNOSIS — E785 Hyperlipidemia, unspecified: Secondary | ICD-10-CM | POA: Diagnosis present

## 2014-04-14 DIAGNOSIS — Z951 Presence of aortocoronary bypass graft: Secondary | ICD-10-CM

## 2014-04-14 DIAGNOSIS — I63411 Cerebral infarction due to embolism of right middle cerebral artery: Secondary | ICD-10-CM

## 2014-04-14 DIAGNOSIS — Z96641 Presence of right artificial hip joint: Secondary | ICD-10-CM | POA: Diagnosis present

## 2014-04-14 DIAGNOSIS — I6789 Other cerebrovascular disease: Secondary | ICD-10-CM | POA: Diagnosis not present

## 2014-04-14 DIAGNOSIS — K219 Gastro-esophageal reflux disease without esophagitis: Secondary | ICD-10-CM | POA: Diagnosis present

## 2014-04-14 DIAGNOSIS — I25119 Atherosclerotic heart disease of native coronary artery with unspecified angina pectoris: Secondary | ICD-10-CM | POA: Diagnosis present

## 2014-04-14 DIAGNOSIS — I509 Heart failure, unspecified: Secondary | ICD-10-CM | POA: Diagnosis present

## 2014-04-14 DIAGNOSIS — M1990 Unspecified osteoarthritis, unspecified site: Secondary | ICD-10-CM | POA: Diagnosis present

## 2014-04-14 DIAGNOSIS — I10 Essential (primary) hypertension: Secondary | ICD-10-CM | POA: Diagnosis not present

## 2014-04-14 DIAGNOSIS — R4781 Slurred speech: Secondary | ICD-10-CM | POA: Diagnosis not present

## 2014-04-14 DIAGNOSIS — R569 Unspecified convulsions: Secondary | ICD-10-CM

## 2014-04-14 LAB — I-STAT CHEM 8, ED
BUN: 20 mg/dL (ref 6–23)
CALCIUM ION: 1.15 mmol/L (ref 1.13–1.30)
Chloride: 103 mmol/L (ref 96–112)
Creatinine, Ser: 1 mg/dL (ref 0.50–1.35)
Glucose, Bld: 93 mg/dL (ref 70–99)
HCT: 42 % (ref 39.0–52.0)
Hemoglobin: 14.3 g/dL (ref 13.0–17.0)
Potassium: 3.8 mmol/L (ref 3.5–5.1)
Sodium: 144 mmol/L (ref 135–145)
TCO2: 24 mmol/L (ref 0–100)

## 2014-04-14 LAB — RAPID URINE DRUG SCREEN, HOSP PERFORMED
Amphetamines: NOT DETECTED
BENZODIAZEPINES: NOT DETECTED
Barbiturates: NOT DETECTED
COCAINE: NOT DETECTED
Opiates: NOT DETECTED
Tetrahydrocannabinol: NOT DETECTED

## 2014-04-14 LAB — URINE MICROSCOPIC-ADD ON

## 2014-04-14 LAB — DIFFERENTIAL
BASOS ABS: 0 10*3/uL (ref 0.0–0.1)
Basophils Relative: 1 % (ref 0–1)
Eosinophils Absolute: 0.2 10*3/uL (ref 0.0–0.7)
Eosinophils Relative: 2 % (ref 0–5)
Lymphocytes Relative: 18 % (ref 12–46)
Lymphs Abs: 1.6 10*3/uL (ref 0.7–4.0)
Monocytes Absolute: 0.9 10*3/uL (ref 0.1–1.0)
Monocytes Relative: 10 % (ref 3–12)
NEUTROS ABS: 6.2 10*3/uL (ref 1.7–7.7)
NEUTROS PCT: 69 % (ref 43–77)

## 2014-04-14 LAB — COMPREHENSIVE METABOLIC PANEL
ALK PHOS: 126 U/L — AB (ref 39–117)
ALT: 13 U/L (ref 0–53)
ANION GAP: 8 (ref 5–15)
AST: 17 U/L (ref 0–37)
Albumin: 3.3 g/dL — ABNORMAL LOW (ref 3.5–5.2)
BUN: 18 mg/dL (ref 6–23)
CO2: 29 mmol/L (ref 19–32)
Calcium: 8.8 mg/dL (ref 8.4–10.5)
Chloride: 106 mmol/L (ref 96–112)
Creatinine, Ser: 1.04 mg/dL (ref 0.50–1.35)
GFR calc Af Amer: 74 mL/min — ABNORMAL LOW (ref >90)
GFR calc non Af Amer: 64 mL/min — ABNORMAL LOW (ref >90)
Glucose, Bld: 95 mg/dL (ref 70–99)
Potassium: 3.8 mmol/L (ref 3.5–5.1)
SODIUM: 143 mmol/L (ref 135–145)
Total Bilirubin: 0.7 mg/dL (ref 0.3–1.2)
Total Protein: 6.4 g/dL (ref 6.0–8.3)

## 2014-04-14 LAB — URINALYSIS, ROUTINE W REFLEX MICROSCOPIC
BILIRUBIN URINE: NEGATIVE
GLUCOSE, UA: NEGATIVE mg/dL
KETONES UR: NEGATIVE mg/dL
Nitrite: NEGATIVE
PROTEIN: 30 mg/dL — AB
SPECIFIC GRAVITY, URINE: 1.015 (ref 1.005–1.030)
UROBILINOGEN UA: 2 mg/dL — AB (ref 0.0–1.0)
pH: 7.5 (ref 5.0–8.0)

## 2014-04-14 LAB — PROTIME-INR
INR: 1.05 (ref 0.00–1.49)
PROTHROMBIN TIME: 13.8 s (ref 11.6–15.2)

## 2014-04-14 LAB — CBC
HCT: 40.3 % (ref 39.0–52.0)
HEMOGLOBIN: 13.4 g/dL (ref 13.0–17.0)
MCH: 30.1 pg (ref 26.0–34.0)
MCHC: 33.3 g/dL (ref 30.0–36.0)
MCV: 90.6 fL (ref 78.0–100.0)
Platelets: 249 10*3/uL (ref 150–400)
RBC: 4.45 MIL/uL (ref 4.22–5.81)
RDW: 13.4 % (ref 11.5–15.5)
WBC: 8.9 10*3/uL (ref 4.0–10.5)

## 2014-04-14 LAB — I-STAT TROPONIN, ED: Troponin i, poc: 0.01 ng/mL (ref 0.00–0.08)

## 2014-04-14 LAB — I-STAT CREATININE, ED: Creatinine, Ser: 1 mg/dL (ref 0.50–1.35)

## 2014-04-14 LAB — ETHANOL: Alcohol, Ethyl (B): <5 mg/dL (ref 0–9)

## 2014-04-14 LAB — MRSA PCR SCREENING: MRSA by PCR: NEGATIVE

## 2014-04-14 LAB — APTT: aPTT: 29 seconds (ref 24–37)

## 2014-04-14 MED ORDER — ACETAMINOPHEN 325 MG PO TABS
650.0000 mg | ORAL_TABLET | ORAL | Status: DC | PRN
  Administered 2014-04-17: 650 mg via ORAL
  Filled 2014-04-14: qty 2

## 2014-04-14 MED ORDER — ACETAMINOPHEN 650 MG RE SUPP: 650.0000 mg | RECTAL | Status: DC | PRN

## 2014-04-14 MED ORDER — LABETALOL HCL 5 MG/ML IV SOLN
10.0000 mg | INTRAVENOUS | Status: DC | PRN
  Administered 2014-04-14: 10 mg via INTRAVENOUS
  Filled 2014-04-14: qty 4

## 2014-04-14 MED ORDER — IOHEXOL 350 MG/ML SOLN
50.0000 mL | Freq: Once | INTRAVENOUS | Status: AC | PRN
  Administered 2014-04-14: 50 mL via INTRAVENOUS

## 2014-04-14 MED ORDER — STROKE: EARLY STAGES OF RECOVERY BOOK
Freq: Once | Status: DC
  Filled 2014-04-14: qty 1

## 2014-04-14 MED ORDER — LEVETIRACETAM IN NACL 500 MG/100ML IV SOLN
500.0000 mg | Freq: Two times a day (BID) | INTRAVENOUS | Status: DC
  Administered 2014-04-14 – 2014-04-16 (×5): 500 mg via INTRAVENOUS
  Filled 2014-04-14 (×6): qty 100

## 2014-04-14 MED ORDER — PANTOPRAZOLE SODIUM 40 MG IV SOLR
40.0000 mg | Freq: Every day | INTRAVENOUS | Status: DC
  Administered 2014-04-14 – 2014-04-15 (×2): 40 mg via INTRAVENOUS
  Filled 2014-04-14 (×2): qty 40

## 2014-04-14 MED ORDER — SODIUM CHLORIDE 0.9 % IV SOLN
INTRAVENOUS | Status: DC
  Administered 2014-04-14: 09:00:00 via INTRAVENOUS
  Administered 2014-04-15: 75 mL/h via INTRAVENOUS
  Administered 2014-04-16: 17:00:00 via INTRAVENOUS
  Administered 2014-04-16: 1000 mL via INTRAVENOUS
  Administered 2014-04-17: 75 mL/h via INTRAVENOUS

## 2014-04-14 MED ORDER — ALTEPLASE (STROKE) FULL DOSE INFUSION
0.9000 mg/kg | Freq: Once | INTRAVENOUS | Status: AC
  Administered 2014-04-14: 66 mg via INTRAVENOUS
  Filled 2014-04-14: qty 66

## 2014-04-14 NOTE — ED Notes (Addendum)
MD at bedside spoke with patient son about doing TPA. Pt son agreed to go ahead with administering TPA.

## 2014-04-14 NOTE — ED Notes (Addendum)
TPa bolus finished

## 2014-04-14 NOTE — H&P (Addendum)
Admission H&P    Chief Complaint: Acute onset of slurred speech and left-sided weakness.  HPI: Trevor Webb is an 81 y.o. male with a history of previous stroke, atrial fibrillation not on anticoagulation, coronary artery disease, hypertension, hyperlipidemia, seizure disorder and dementia, brought to the emergency room and code stroke status following acute onset of left-sided weakness and slurred speech area he was last seen well at 6 AM this morning. He's not on anticoagulation because of GI. He's been taking aspirin daily. CT scan of his head showed findings suggestive of nonhemorrhagic infarction with gray-white differentiation loss within the right precentral gyrus. CT angiogram showed no large vessel occlusion or severe stenosis extracranially nor intracranial. Patient was deemed a candidate for intravenous thrombolytic therapy with TPA which was administered. No clinical improvement was noted. NIH stroke score was 23.  LSN: 6:00 AM on 04/14/2014 tPA Given: Yes MRankin: 4  Past Medical History  Diagnosis Date  . Hypertension   . CHF, left ventricular   . Paroxysmal atrial fibrillation   . CHF (congestive heart failure)   . Angina   . Myocardial infarction 2009  . Stroke 2011    residual:  "right sided weakness; problems walking; dementia"  . Dementia     S/P CVA 2011  . Arthritis   . Dysphasia   . A-fib   . GERD (gastroesophageal reflux disease)   . Alzheimer's dementia   . Seizures     epilepsy  . Diverticulitis   . Abdominal abscess     Past Surgical History  Procedure Laterality Date  . Coronary artery bypass graft  2009    CABG X4  . Knee arthroscopy      multiple; bilaterally  . Total knee arthroplasty      bilateral  . Total hip arthroplasty      right  . Joint replacement      bilateral knees; right hip  . Colon surgery    . Colon resection  1980's    "had colostomy bag for awhile; went back in and put it all back together"  . Colostomy takedown   1980's  . Esophagogastroduodenoscopy N/A 08/20/2012    Procedure: ESOPHAGOGASTRODUODENOSCOPY (EGD);  Surgeon: Winfield Cunas., MD;  Location: [facility] ENDOSCOPY;  Service: Endoscopy;  Laterality: N/A;  . Colonoscopy N/A 08/20/2012    Procedure: COLONOSCOPY;  Surgeon: Winfield Cunas., MD;  Location: [facility] ENDOSCOPY;  Service: Endoscopy;  Laterality: N/A;    Family History  Problem Relation Age of Onset  . Stroke Mother   . Heart attack Father    Social History:  reports that he has never smoked. He has never used smokeless tobacco. He reports that he does not drink alcohol or use illicit drugs.  Allergies:  Allergies  Allergen Reactions  . Morphine And Related     Delusions   Medications: Patient's preadmission medications were reviewed by me.  ROS: lHistory obtained from patient's son.  General ROS: negative for - chills, fatigue, fever, night sweats, weight gain or weight loss Psychological ROS: Dementia with progressive decline over the past one month Ophthalmic ROS: negative for - blurry vision, double vision, eye pain or loss of vision ENT ROS: negative for - epistaxis, nasal discharge, oral lesions, sore throat, tinnitus or vertigo Allergy and Immunology ROS: negative for - hives or itchy/watery eyes Hematological and Lymphatic ROS: negative for - bleeding problems, bruising or swollen lymph nodes Endocrine ROS: negative for - galactorrhea, hair pattern changes, polydipsia/polyuria or temperature intolerance Respiratory  ROS: negative for - cough, hemoptysis, shortness of breath or wheezing Cardiovascular ROS: negative for - chest pain, dyspnea on exertion, edema or irregular heartbeat Gastrointestinal ROS: negative for - abdominal pain, diarrhea, hematemesis, nausea/vomiting or stool incontinence Genito-Urinary ROS: negative for - dysuria, hematuria, incontinence or urinary frequency/urgency Musculoskeletal ROS: negative for - joint swelling or muscular weakness Neurological  ROS: as noted in HPI Dermatological ROS: negative for rash and skin lesion changes  Physical Examination: Blood pressure 176/74, pulse 61, temperature 97.2 F (36.2 C), temperature source Axillary, resp. rate 18, weight 73.483 kg (162 lb), SpO2 100 %.  HEENT-  Normocephalic, no lesions, without obvious abnormality.  Normal external eye and conjunctiva.  Normal TM's bilaterally.  Normal auditory canals and external ears. Normal external nose, mucus membranes and septum.  Normal pharynx. Neck supple with no masses, nodes, nodules or enlargement. Cardiovascular - irregularly irregular rhythm and S1, S2 normal Lungs - chest clear, no wheezing, rales, normal symmetric air entry Abdomen - soft, non-tender; bowel sounds normal; no masses,  no organomegaly Extremities - no joint deformities, effusion, or inflammation, no edema and no skin discoloration  Neurologic Examination: Mental Status: Lethargic, disoriented to age and correct month.  Speech moderately slurred without evidence of aphasia. Able to follow commands with with use of right extremities. Cranial Nerves: II-dense left homonymous hemianopsia. III/IV/VI-Pupils were equal and reacted. Eyes were deviated to the right side with no movement of eyes currently on the midline on left with movement.    V/VII-reduced perception of tactile sensation over left side of the face compared to the right; moderate left lower facial weakness. VIII-normal. X-moderately severe dysarthria. XI: trapezius strength/neck flexion strength normal bilaterally XII-midline tongue extension with normal strength. Motor: Flaccid paralysis of left upper and lower extremities; normal strength of right extremities, as well as normal muscle tone. Sensory: Neglect of left side. Deep Tendon Reflexes: 2+ and symmetric. Plantars: Mute bilaterally Cerebellar: Normal coordination of left upper extremity. Carotid auscultation: Normal  Results for orders placed or performed  during the hospital encounter of 04/14/14 (from the past 48 hour(s))  Protime-INR     Status: None   Collection Time: 04/14/14  7:00 AM  Result Value Ref Range   Prothrombin Time 13.8 11.6 - 15.2 seconds   INR 1.05 0.00 - 1.49  APTT     Status: None   Collection Time: 04/14/14  7:00 AM  Result Value Ref Range   aPTT 29 24 - 37 seconds  CBC     Status: None   Collection Time: 04/14/14  7:00 AM  Result Value Ref Range   WBC 8.9 4.0 - 10.5 K/uL   RBC 4.45 4.22 - 5.81 MIL/uL   Hemoglobin 13.4 13.0 - 17.0 g/dL   HCT 40.3 39.0 - 52.0 %   MCV 90.6 78.0 - 100.0 fL   MCH 30.1 26.0 - 34.0 pg   MCHC 33.3 30.0 - 36.0 g/dL   RDW 13.4 11.5 - 15.5 %   Platelets 249 150 - 400 K/uL  Differential     Status: None   Collection Time: 04/14/14  7:00 AM  Result Value Ref Range   Neutrophils Relative % 69 43 - 77 %   Neutro Abs 6.2 1.7 - 7.7 K/uL   Lymphocytes Relative 18 12 - 46 %   Lymphs Abs 1.6 0.7 - 4.0 K/uL   Monocytes Relative 10 3 - 12 %   Monocytes Absolute 0.9 0.1 - 1.0 K/uL   Eosinophils Relative 2 0 - 5 %  Eosinophils Absolute 0.2 0.0 - 0.7 K/uL   Basophils Relative 1 0 - 1 %   Basophils Absolute 0.0 0.0 - 0.1 K/uL  Comprehensive metabolic panel     Status: Abnormal   Collection Time: 04/14/14  7:00 AM  Result Value Ref Range   Sodium 143 135 - 145 mmol/L   Potassium 3.8 3.5 - 5.1 mmol/L   Chloride 106 96 - 112 mmol/L   CO2 29 19 - 32 mmol/L   Glucose, Bld 95 70 - 99 mg/dL   BUN 18 6 - 23 mg/dL   Creatinine, Ser 1.04 0.50 - 1.35 mg/dL   Calcium 8.8 8.4 - 10.5 mg/dL   Total Protein 6.4 6.0 - 8.3 g/dL   Albumin 3.3 (L) 3.5 - 5.2 g/dL   AST 17 0 - 37 U/L   ALT 13 0 - 53 U/L   Alkaline Phosphatase 126 (H) 39 - 117 U/L   Total Bilirubin 0.7 0.3 - 1.2 mg/dL   GFR calc non Af Amer 64 (L) >90 mL/min   GFR calc Af Amer 74 (L) >90 mL/min    Comment: (NOTE) The eGFR has been calculated using the CKD EPI equation. This calculation has not been validated in all clinical  situations. eGFR's persistently <90 mL/min signify possible Chronic Kidney Disease.    Anion gap 8 5 - 15  I-stat troponin, ED (not at [facility])     Status: None   Collection Time: 04/14/14  7:02 AM  Result Value Ref Range   Troponin i, poc 0.01 0.00 - 0.08 ng/mL   Comment 3            Comment: Due to the release kinetics of cTnI, a negative result within the first hours of the onset of symptoms does not rule out myocardial infarction with certainty. If myocardial infarction is still suspected, repeat the test at appropriate intervals.   I-Stat Creatinine, ED (do not order at [facility])     Status: None   Collection Time: 04/14/14  7:02 AM  Result Value Ref Range   Creatinine, Ser 1.00 0.50 - 1.35 mg/dL  I-Stat Chem 8, ED     Status: None   Collection Time: 04/14/14  7:03 AM  Result Value Ref Range   Sodium 144 135 - 145 mmol/L   Potassium 3.8 3.5 - 5.1 mmol/L   Chloride 103 96 - 112 mmol/L   BUN 20 6 - 23 mg/dL   Creatinine, Ser 1.00 0.50 - 1.35 mg/dL   Glucose, Bld 93 70 - 99 mg/dL   Calcium, Ion 1.15 1.13 - 1.30 mmol/L   TCO2 24 0 - 100 mmol/L   Hemoglobin 14.3 13.0 - 17.0 g/dL   HCT 42.0 39.0 - 52.0 %  Ethanol     Status: None   Collection Time: 04/14/14  7:03 AM  Result Value Ref Range   Alcohol, Ethyl (B) <5 0 - 9 mg/dL    Comment:        LOWEST DETECTABLE LIMIT FOR SERUM ALCOHOL IS 11 mg/dL FOR MEDICAL PURPOSES ONLY   Urine Drug Screen     Status: None   Collection Time: 04/14/14  7:30 AM  Result Value Ref Range   Opiates NONE DETECTED NONE DETECTED   Cocaine NONE DETECTED NONE DETECTED   Benzodiazepines NONE DETECTED NONE DETECTED   Amphetamines NONE DETECTED NONE DETECTED   Tetrahydrocannabinol NONE DETECTED NONE DETECTED   Barbiturates NONE DETECTED NONE DETECTED    Comment:  DRUG SCREEN FOR MEDICAL PURPOSES ONLY.  IF CONFIRMATION IS NEEDED FOR ANY PURPOSE, NOTIFY LAB WITHIN 5 DAYS.        LOWEST DETECTABLE LIMITS FOR URINE DRUG SCREEN Drug Class        Cutoff (ng/mL) Amphetamine      1000 Barbiturate      200 Benzodiazepine   177 Tricyclics       939 Opiates          300 Cocaine          300 THC              50   Urinalysis, Routine w reflex microscopic     Status: Abnormal   Collection Time: 04/14/14  7:30 AM  Result Value Ref Range   Color, Urine YELLOW YELLOW   APPearance CLEAR CLEAR   Specific Gravity, Urine 1.015 1.005 - 1.030   pH 7.5 5.0 - 8.0   Glucose, UA NEGATIVE NEGATIVE mg/dL   Hgb urine dipstick SMALL (A) NEGATIVE   Bilirubin Urine NEGATIVE NEGATIVE   Ketones, ur NEGATIVE NEGATIVE mg/dL   Protein, ur 30 (A) NEGATIVE mg/dL   Urobilinogen, UA 2.0 (H) 0.0 - 1.0 mg/dL   Nitrite NEGATIVE NEGATIVE   Leukocytes, UA TRACE (A) NEGATIVE  Urine microscopic-add on     Status: None   Collection Time: 04/14/14  7:30 AM  Result Value Ref Range   Squamous Epithelial / LPF RARE RARE   WBC, UA 0-2 <3 WBC/hpf   RBC / HPF 3-6 <3 RBC/hpf   Bacteria, UA RARE RARE   Urine-Other MUCOUS PRESENT     Comment: LESS THAN 10 mL OF URINE SUBMITTED MICROSCOPIC EXAM PERFORMED ON UNCONCENTRATED URINE    Ct Angio Head W/cm &/or Wo Cm  04/14/2014   CLINICAL DATA:  Code stroke. Slurred speech. Leaning to the left. Left-sided weakness.  EXAM: CT ANGIOGRAPHY HEAD AND NECK  TECHNIQUE: Multidetector CT imaging of the head and neck was performed using the standard protocol during bolus administration of intravenous contrast. Multiplanar CT image reconstructions and MIPs were obtained to evaluate the vascular anatomy. Carotid stenosis measurements (when applicable) are obtained utilizing NASCET criteria, using the distal internal carotid diameter as the denominator.  CONTRAST:  86m OMNIPAQUE IOHEXOL 350 MG/ML SOLN  COMPARISON:  CT head without contrast 11/23/2013  FINDINGS: CT HEAD  Brain: There is new loss of gray-white differentiation along the precentral gyrus near the vertex at both the level the ventricles and above. A remote left ACA territory  infarct is present. Mild generalized atrophy and white matter disease is noted. There is some asymmetry of density in the right M1 segment compared to the left. No acute hemorrhage is present. The ventricles are proportionate to the degree of atrophy. No significant extraaxial fluid collection is present.  ASPECTS score = 8/10  [place] Early [place] Score  Normal score = 10  Calvarium and skull base: Negative  Paranasal sinuses: Clear  Orbits: Bilateral lens extractions are noted. The globes and orbits are otherwise intact.  CTA NECK  Aortic arch: The left vertebral artery originates from the aortic arch. There is a high-grade stenosis at the origin of the left vertebral artery of greater than 70%. Atherosclerotic calcifications are present in the aorta without aneurysm or other significant stenosis.  Right carotid system: Right common carotid artery is tortuous. Mild irregularity is present at the carotid bifurcation. There is no significant stenosis. The cervical right ICA is within normal limits.  Left carotid  system: The left common carotid artery is within normal limits. The bifurcation demonstrates mild irregularity. There is some tortuosity of the cervical left ICA without a significant stenosis. The 3D images suggest a stenosis which is artifactual.  Vertebral arteries:A high-grade proximal left vertebral artery stenosis is present. This is the dominant vessel. There is no stenosis at the origin of the right vertebral artery from the subclavian artery. No other stenoses are present within the vertebral arteries.  Skeleton: Multilevel degenerative changes of the cervical spine are most evident at C5-6 and C6-7. There is fusion across the disc space at C5-6 and a vacuum disc at C6-7. Uncovertebral spurring an osseous foraminal narrowing is present at both levels.  Other neck: Soft tissues of the neck are otherwise within normal limits.  CTA HEAD  Anterior circulation: The internal carotid arteries  demonstrate minimal atherosclerotic calcification within the cavernous segments. There are no significant stenoses. The A1 and M1 segments are normal. Both M1 segments are patent. No definite anterior communicating artery is present. Segmental attenuation is present within MCA branch vessels bilaterally. This is most prominent in the anterior left M2 branches. A posterior right MCA branch is somewhat more robust than the more anterior branches with suggestion of peel collaterals. This may represent luxury perfusion associated with an acute infarct.  Posterior circulation: Atherosclerotic irregularity and a 50% stenosis is present in the mid basilar artery. The PICA origins are intact. The vertebrobasilar junction is intact. There is a high-grade stenosis in the proximal left P1 segment. The right posterior cerebral artery is of fetal type. There is mild attenuation of distal PCA branch vessels.  Venous sinuses: The dural sinuses are patent.  Anatomic variants: A fetal type right posterior cerebral artery is present.  IMPRESSION: 1. Loss of gray-white differentiation within the right precentral gyrus suggesting acute/subacute nonhemorrhagic infarct within the primary motor cortex. 2. Slight asymmetric hyperdensity of the right M1 segment on the noncontrast images without occlusion or stenosis on the CTA images. This could represent slow flow within the right M1 segment. 3. Slightly dilated distal posterior right MCA branch vessels and probable peel collaterals are also consistent with a right MCA infarct. 4. Remote left ACA territory infarct. 5. High-grade stenosis at the origin of the left vertebral artery, the dominant vessel. 6. High-grade left P1 segment stenosis. 7. 50% stenosis and atherosclerotic change within the mid basilar artery. These results were called by telephone at the time of interpretation on 04/14/2014 at 7:28 am to Dr. Wallie Char , who verbally acknowledged these results.   Electronically  Signed   By: San Morelle M.D.   On: 04/14/2014 08:00   Ct Angio Neck W/cm &/or Wo/cm  04/14/2014   CLINICAL DATA:  Code stroke. Slurred speech. Leaning to the left. Left-sided weakness.  EXAM: CT ANGIOGRAPHY HEAD AND NECK  TECHNIQUE: Multidetector CT imaging of the head and neck was performed using the standard protocol during bolus administration of intravenous contrast. Multiplanar CT image reconstructions and MIPs were obtained to evaluate the vascular anatomy. Carotid stenosis measurements (when applicable) are obtained utilizing NASCET criteria, using the distal internal carotid diameter as the denominator.  CONTRAST:  76m OMNIPAQUE IOHEXOL 350 MG/ML SOLN  COMPARISON:  CT head without contrast 11/23/2013  FINDINGS: CT HEAD  Brain: There is new loss of gray-white differentiation along the precentral gyrus near the vertex at both the level the ventricles and above. A remote left ACA territory infarct is present. Mild generalized atrophy and white matter disease is noted.  There is some asymmetry of density in the right M1 segment compared to the left. No acute hemorrhage is present. The ventricles are proportionate to the degree of atrophy. No significant extraaxial fluid collection is present.  ASPECTS score = 8/10  [place] Stroke Program Early CT Score  Normal score = 10  Calvarium and skull base: Negative  Paranasal sinuses: Clear  Orbits: Bilateral lens extractions are noted. The globes and orbits are otherwise intact.  CTA NECK  Aortic arch: The left vertebral artery originates from the aortic arch. There is a high-grade stenosis at the origin of the left vertebral artery of greater than 70%. Atherosclerotic calcifications are present in the aorta without aneurysm or other significant stenosis.  Right carotid system: Right common carotid artery is tortuous. Mild irregularity is present at the carotid bifurcation. There is no significant stenosis. The cervical right ICA is within normal limits.   Left carotid system: The left common carotid artery is within normal limits. The bifurcation demonstrates mild irregularity. There is some tortuosity of the cervical left ICA without a significant stenosis. The 3D images suggest a stenosis which is artifactual.  Vertebral arteries:A high-grade proximal left vertebral artery stenosis is present. This is the dominant vessel. There is no stenosis at the origin of the right vertebral artery from the subclavian artery. No other stenoses are present within the vertebral arteries.  Skeleton: Multilevel degenerative changes of the cervical spine are most evident at C5-6 and C6-7. There is fusion across the disc space at C5-6 and a vacuum disc at C6-7. Uncovertebral spurring an osseous foraminal narrowing is present at both levels.  Other neck: Soft tissues of the neck are otherwise within normal limits.  CTA HEAD  Anterior circulation: The internal carotid arteries demonstrate minimal atherosclerotic calcification within the cavernous segments. There are no significant stenoses. The A1 and M1 segments are normal. Both M1 segments are patent. No definite anterior communicating artery is present. Segmental attenuation is present within MCA branch vessels bilaterally. This is most prominent in the anterior left M2 branches. A posterior right MCA branch is somewhat more robust than the more anterior branches with suggestion of peel collaterals. This may represent luxury perfusion associated with an acute infarct.  Posterior circulation: Atherosclerotic irregularity and a 50% stenosis is present in the mid basilar artery. The PICA origins are intact. The vertebrobasilar junction is intact. There is a high-grade stenosis in the proximal left P1 segment. The right posterior cerebral artery is of fetal type. There is mild attenuation of distal PCA branch vessels.  Venous sinuses: The dural sinuses are patent.  Anatomic variants: A fetal type right posterior cerebral artery is  present.  IMPRESSION: 1. Loss of gray-white differentiation within the right precentral gyrus suggesting acute/subacute nonhemorrhagic infarct within the primary motor cortex. 2. Slight asymmetric hyperdensity of the right M1 segment on the noncontrast images without occlusion or stenosis on the CTA images. This could represent slow flow within the right M1 segment. 3. Slightly dilated distal posterior right MCA branch vessels and probable peel collaterals are also consistent with a right MCA infarct. 4. Remote left ACA territory infarct. 5. High-grade stenosis at the origin of the left vertebral artery, the dominant vessel. 6. High-grade left P1 segment stenosis. 7. 50% stenosis and atherosclerotic change within the mid basilar artery. These results were called by telephone at the time of interpretation on 04/14/2014 at 7:28 am to Dr. Wallie Char , who verbally acknowledged these results.   Electronically Signed   By: Harrell Gave  Mattern M.D.   On: 04/14/2014 08:00    Assessment: 81 y.o. male with multiple risk factors for stroke including atrial fibrillation, as well as history of previous cerebral infarction presenting with an acute ischemic right MCA territory infarction.  Stroke Risk Factors - atrial fibrillation, family history, hyperlipidemia and hypertension  Plan: 1. HgbA1c, fasting lipid panel 2. MRI of the brain without contrast 3. PT consult, OT consult, Speech consult 4. Echocardiogram 5. Prophylactic therapy-Antiplatelet med: Aspirin if CT scan 24 hours post TPA administration shows no intracranial hemorrhage. 6. Risk factor modification 7. Telemetry monitoring  This patient was critically ill and at significant risk of neurological worsening, death and care required constant monitoring of vital signs, hemodynamics,respiratory and cardiac monitoring, neurological assessment, discussion with family, other specialists and medical decision making of high complexity. Total critical care  time was 90 minutes.  C.R. Nicole Kindred, MD [facility] [phone]  04/14/2014, 8:28 AM

## 2014-04-14 NOTE — ED Notes (Signed)
Pt arrives as a code stroke from [facility], slurred speech, left sided lean. Left sided weakness. Unable to tell if alert and oriented, but responsive to pain.

## 2014-04-14 NOTE — ED Provider Notes (Signed)
CSN: [id number]     Arrival date & time 04/14/14  0650 History   None    Chief Complaint  Patient presents with  . Code Stroke     (Consider location/radiation/quality/duration/timing/severity/associated sxs/prior Treatment) The history is provided by the patient and medical records.    This is an 81 year old male with past medical history significant for hypertension, paroxysmal A. Fib, prior CVA congestive heart failure, prior MI, Alzheimer's, seizure disorder, presenting to the ED as a code stroke.  Last seen normal was 0600 and soon after developed slurred speech, left facial droop, and left-sided weakness.  Her son, patient had a steady decline in his functional capacity over the past month. He has been taken off most of his medications, notably anticoagulants as he had a severe GI bleed. He also recently had a bout with hand cellulitis which seemed to take a toll on his overall health. Her son, no recent illness or fevers. Patient is a DO NOT RESUSCITATE, confirmed by son.   Past Medical History  Diagnosis Date  . Hypertension   . CHF, left ventricular   . Paroxysmal atrial fibrillation   . CHF (congestive heart failure)   . Angina   . Myocardial infarction 2009  . Stroke 2011    residual:  "right sided weakness; problems walking; dementia"  . Dementia     S/P CVA 2011  . Arthritis   . Dysphasia   . A-fib   . GERD (gastroesophageal reflux disease)   . Alzheimer's dementia   . Seizures     epilepsy  . Diverticulitis   . Abdominal abscess    Past Surgical History  Procedure Laterality Date  . Coronary artery bypass graft  2009    CABG X4  . Knee arthroscopy      multiple; bilaterally  . Total knee arthroplasty      bilateral  . Total hip arthroplasty      right  . Joint replacement      bilateral knees; right hip  . Colon surgery    . Colon resection  1980's    "had colostomy bag for awhile; went back in and put it all back together"  . Colostomy takedown   1980's  . Esophagogastroduodenoscopy N/A 08/20/2012    Procedure: ESOPHAGOGASTRODUODENOSCOPY (EGD);  Surgeon: Vertell Novak., MD;  Location: [facility] ENDOSCOPY;  Service: Endoscopy;  Laterality: N/A;  . Colonoscopy N/A 08/20/2012    Procedure: COLONOSCOPY;  Surgeon: Vertell Novak., MD;  Location: [facility] ENDOSCOPY;  Service: Endoscopy;  Laterality: N/A;   Family History  Problem Relation Age of Onset  . Stroke Mother   . Heart attack Father    History  Substance Use Topics  . Smoking status: Never Smoker   . Smokeless tobacco: Never Used  . Alcohol Use: No    Review of Systems  Neurological: Positive for facial asymmetry, speech difficulty and weakness.  All other systems reviewed and are negative.     Allergies  Morphine and related  Home Medications   Prior to Admission medications   Medication Sig Start Date End Date Taking? Authorizing Provider  acetaminophen (TYLENOL) 325 MG tablet Take 2 tablets (650 mg total) by mouth every 6 (six) hours as needed for pain. 08/23/12   Shanker Levora Dredge, MD  aspirin EC 81 MG tablet Take 1 tablet (81 mg total) by mouth daily. 08/23/12   Shanker Levora Dredge, MD  azelastine (ASTELIN) 137 MCG/SPRAY nasal spray Place 2 sprays into the nose 2 (  two) times daily as needed for allergies.     Historical Provider, MD  carvedilol (COREG) 3.125 MG tablet Take 3.125 mg by mouth 2 (two) times daily with a meal.    Historical Provider, MD  cephALEXin (KEFLEX) 500 MG capsule Take 1 capsule (500 mg total) by mouth 3 (three) times daily. 12/01/13   Rolland Porter, MD  ciprofloxacin (CIPRO) 250 MG tablet Take 250 mg by mouth 2 (two) times daily. For ten days, to stop 12/02/13    Historical Provider, MD  cyanocobalamin (,VITAMIN B-12,) 1000 MCG/ML injection Inject 1,000 mcg into the muscle every 30 (thirty) days. Give on the 24th of each month    Historical Provider, MD  docusate sodium (COLACE) 100 MG capsule Take 100 mg by mouth daily.    Historical Provider, MD   donepezil (ARICEPT) 10 MG tablet Take 10 mg by mouth at bedtime.    Historical Provider, MD  ferrous sulfate 325 (65 FE) MG tablet Take 325 mg by mouth 2 (two) times daily.    Historical Provider, MD  furosemide (LASIX) 40 MG tablet Take 40 mg by mouth daily.    Historical Provider, MD  isosorbide dinitrate (ISORDIL) 30 MG tablet Take 15 mg by mouth daily.    Historical Provider, MD  isosorbide mononitrate (IMDUR) 30 MG 24 hr tablet Take 15 mg by mouth daily.    Historical Provider, MD  levETIRAcetam (KEPPRA) 500 MG tablet Take 500 mg by mouth 2 (two) times daily.    Historical Provider, MD  loratadine (CLARITIN) 10 MG tablet Take 10 mg by mouth daily.    Historical Provider, MD  nitroGLYCERIN (NITROSTAT) 0.4 MG SL tablet Place 0.4 mg under the tongue every 5 (five) minutes as needed for chest pain.     Historical Provider, MD  pantoprazole (PROTONIX) 40 MG tablet Take 1 tablet (40 mg total) by mouth daily. 03/30/12   Richarda Overlie, MD  polyethylene glycol (MIRALAX / GLYCOLAX) packet Take 17 g by mouth daily.    Historical Provider, MD  saccharomyces boulardii (FLORASTOR) 250 MG capsule Take 250 mg by mouth daily.    Historical Provider, MD  sertraline (ZOLOFT) 25 MG tablet Take 12.5 mg by mouth daily.     Historical Provider, MD  Tamsulosin HCl (FLOMAX) 0.4 MG CAPS Take 0.4 mg by mouth daily after breakfast.     Historical Provider, MD  traMADol (ULTRAM) 50 MG tablet Take 1 tablet (50 mg total) by mouth every 6 (six) hours as needed. 08/23/12   Shanker Levora Dredge, MD  valACYclovir (VALTREX) 1000 MG tablet Take 1 tablet (1,000 mg total) by mouth 3 (three) times daily. 08/27/12   Rhetta Mura, MD   BP 178/86 mmHg  Pulse 54  Temp(Src) 97.2 F (36.2 C) (Axillary)  Resp 22  Wt 162 lb (73.483 kg)  SpO2 100%   Physical Exam  Constitutional: He appears well-developed and well-nourished.  elderly  HENT:  Head: Normocephalic and atraumatic.  Mouth/Throat: Oropharynx is clear and moist.  Eyes:  Conjunctivae and EOM are normal. Pupils are equal, round, and reactive to light.  Rightward gaze, eyes not crossing midline  Neck: Normal range of motion.  Cardiovascular: Normal rate, regular rhythm and normal heart sounds.   Pulmonary/Chest: Effort normal and breath sounds normal. No respiratory distress. He has no wheezes.  Abdominal: Soft. Bowel sounds are normal.  Musculoskeletal: Normal range of motion.  Neurological: He is alert.  Awake and alert; moving right upper and lower extremities when prompted; left upper and  lower extremities appear flacid and notably weak; left facial droop noted with slurred speech; left sided neglect noted  Skin: Skin is warm and dry.  Psychiatric: He has a normal mood and affect.  Nursing note and vitals reviewed.   ED Course  Procedures (including critical care time)  CRITICAL CARE Performed by: Garlon Hatchet   Total critical care time: 40  Critical care time was exclusive of separately billable procedures and treating other patients.  Critical care was necessary to treat or prevent imminent or life-threatening deterioration.  Critical care was time spent personally by me on the following activities: development of treatment plan with patient and/or surrogate as well as nursing, discussions with consultants, evaluation of patient's response to treatment, examination of patient, obtaining history from patient or surrogate, ordering and performing treatments and interventions, ordering and review of laboratory studies, ordering and review of radiographic studies, pulse oximetry and re-evaluation of patient's condition.  Medications   stroke: mapping our early stages of recovery book (not administered)  0.9 %  sodium chloride infusion ( Intravenous New Bag/Given 04/14/14 0833)  acetaminophen (TYLENOL) tablet 650 mg (not administered)    Or  acetaminophen (TYLENOL) suppository 650 mg (not administered)  labetalol (NORMODYNE,TRANDATE) injection 10  mg (10 mg Intravenous Given 04/14/14 0945)  pantoprazole (PROTONIX) injection 40 mg (not administered)  alteplase (ACTIVASE) 1 mg/mL infusion 66 mg (0 mg/kg  73.5 kg Intravenous Stopped 04/14/14 0833)  iohexol (OMNIPAQUE) 350 MG/ML injection 50 mL (50 mLs Intravenous Contrast Given 04/14/14 0715)    Labs Review Labs Reviewed  COMPREHENSIVE METABOLIC PANEL - Abnormal; Notable for the following:    Albumin 3.3 (*)    Alkaline Phosphatase 126 (*)    GFR calc non Af Amer 64 (*)    GFR calc Af Amer 74 (*)    All other components within normal limits  URINALYSIS, ROUTINE W REFLEX MICROSCOPIC - Abnormal; Notable for the following:    Hgb urine dipstick SMALL (*)    Protein, ur 30 (*)    Urobilinogen, UA 2.0 (*)    Leukocytes, UA TRACE (*)    All other components within normal limits  PROTIME-INR  APTT  CBC  DIFFERENTIAL  ETHANOL  URINE RAPID DRUG SCREEN (HOSP PERFORMED)  URINE MICROSCOPIC-ADD ON  CBG MONITORING, ED  I-STAT CHEM 8, ED  I-STAT TROPOININ, ED  I-STAT CREATININE, ED  I-STAT TROPOININ, ED  I-STAT TROPOININ, ED    Imaging Review Ct Angio Head W/cm &/or Wo Cm  04/14/2014   CLINICAL DATA:  Code stroke. Slurred speech. Leaning to the left. Left-sided weakness.  EXAM: CT ANGIOGRAPHY HEAD AND NECK  TECHNIQUE: Multidetector CT imaging of the head and neck was performed using the standard protocol during bolus administration of intravenous contrast. Multiplanar CT image reconstructions and MIPs were obtained to evaluate the vascular anatomy. Carotid stenosis measurements (when applicable) are obtained utilizing NASCET criteria, using the distal internal carotid diameter as the denominator.  CONTRAST:  50mL OMNIPAQUE IOHEXOL 350 MG/ML SOLN  COMPARISON:  CT head without contrast 11/23/2013  FINDINGS: CT HEAD  Brain: There is new loss of gray-white differentiation along the precentral gyrus near the vertex at both the level the ventricles and above. A remote left ACA territory infarct is  present. Mild generalized atrophy and white matter disease is noted. There is some asymmetry of density in the right M1 segment compared to the left. No acute hemorrhage is present. The ventricles are proportionate to the degree of atrophy. No significant extraaxial fluid  collection is present.  ASPECTS score = 8/10  [place] Stroke Program Early CT Score  Normal score = 10  Calvarium and skull base: Negative  Paranasal sinuses: Clear  Orbits: Bilateral lens extractions are noted. The globes and orbits are otherwise intact.  CTA NECK  Aortic arch: The left vertebral artery originates from the aortic arch. There is a high-grade stenosis at the origin of the left vertebral artery of greater than 70%. Atherosclerotic calcifications are present in the aorta without aneurysm or other significant stenosis.  Right carotid system: Right common carotid artery is tortuous. Mild irregularity is present at the carotid bifurcation. There is no significant stenosis. The cervical right ICA is within normal limits.  Left carotid system: The left common carotid artery is within normal limits. The bifurcation demonstrates mild irregularity. There is some tortuosity of the cervical left ICA without a significant stenosis. The 3D images suggest a stenosis which is artifactual.  Vertebral arteries:A high-grade proximal left vertebral artery stenosis is present. This is the dominant vessel. There is no stenosis at the origin of the right vertebral artery from the subclavian artery. No other stenoses are present within the vertebral arteries.  Skeleton: Multilevel degenerative changes of the cervical spine are most evident at C5-6 and C6-7. There is fusion across the disc space at C5-6 and a vacuum disc at C6-7. Uncovertebral spurring an osseous foraminal narrowing is present at both levels.  Other neck: Soft tissues of the neck are otherwise within normal limits.  CTA HEAD  Anterior circulation: The internal carotid arteries demonstrate  minimal atherosclerotic calcification within the cavernous segments. There are no significant stenoses. The A1 and M1 segments are normal. Both M1 segments are patent. No definite anterior communicating artery is present. Segmental attenuation is present within MCA branch vessels bilaterally. This is most prominent in the anterior left M2 branches. A posterior right MCA branch is somewhat more robust than the more anterior branches with suggestion of peel collaterals. This may represent luxury perfusion associated with an acute infarct.  Posterior circulation: Atherosclerotic irregularity and a 50% stenosis is present in the mid basilar artery. The PICA origins are intact. The vertebrobasilar junction is intact. There is a high-grade stenosis in the proximal left P1 segment. The right posterior cerebral artery is of fetal type. There is mild attenuation of distal PCA branch vessels.  Venous sinuses: The dural sinuses are patent.  Anatomic variants: A fetal type right posterior cerebral artery is present.  IMPRESSION: 1. Loss of gray-white differentiation within the right precentral gyrus suggesting acute/subacute nonhemorrhagic infarct within the primary motor cortex. 2. Slight asymmetric hyperdensity of the right M1 segment on the noncontrast images without occlusion or stenosis on the CTA images. This could represent slow flow within the right M1 segment. 3. Slightly dilated distal posterior right MCA branch vessels and probable peel collaterals are also consistent with a right MCA infarct. 4. Remote left ACA territory infarct. 5. High-grade stenosis at the origin of the left vertebral artery, the dominant vessel. 6. High-grade left P1 segment stenosis. 7. 50% stenosis and atherosclerotic change within the mid basilar artery. These results were called by telephone at the time of interpretation on 04/14/2014 at 7:28 am to Dr. Noel Christmas , who verbally acknowledged these results.   Electronically Signed   By:  Marin Roberts M.D.   On: 04/14/2014 08:00   Ct Angio Neck W/cm &/or Wo/cm  04/14/2014   CLINICAL DATA:  Code stroke. Slurred speech. Leaning to the left. Left-sided weakness.  EXAM: CT ANGIOGRAPHY HEAD AND NECK  TECHNIQUE: Multidetector CT imaging of the head and neck was performed using the standard protocol during bolus administration of intravenous contrast. Multiplanar CT image reconstructions and MIPs were obtained to evaluate the vascular anatomy. Carotid stenosis measurements (when applicable) are obtained utilizing NASCET criteria, using the distal internal carotid diameter as the denominator.  CONTRAST:  74mL OMNIPAQUE IOHEXOL 350 MG/ML SOLN  COMPARISON:  CT head without contrast 11/23/2013  FINDINGS: CT HEAD  Brain: There is new loss of gray-white differentiation along the precentral gyrus near the vertex at both the level the ventricles and above. A remote left ACA territory infarct is present. Mild generalized atrophy and white matter disease is noted. There is some asymmetry of density in the right M1 segment compared to the left. No acute hemorrhage is present. The ventricles are proportionate to the degree of atrophy. No significant extraaxial fluid collection is present.  ASPECTS score = 8/10  [place] Stroke Program Early CT Score  Normal score = 10  Calvarium and skull base: Negative  Paranasal sinuses: Clear  Orbits: Bilateral lens extractions are noted. The globes and orbits are otherwise intact.  CTA NECK  Aortic arch: The left vertebral artery originates from the aortic arch. There is a high-grade stenosis at the origin of the left vertebral artery of greater than 70%. Atherosclerotic calcifications are present in the aorta without aneurysm or other significant stenosis.  Right carotid system: Right common carotid artery is tortuous. Mild irregularity is present at the carotid bifurcation. There is no significant stenosis. The cervical right ICA is within normal limits.  Left carotid  system: The left common carotid artery is within normal limits. The bifurcation demonstrates mild irregularity. There is some tortuosity of the cervical left ICA without a significant stenosis. The 3D images suggest a stenosis which is artifactual.  Vertebral arteries:A high-grade proximal left vertebral artery stenosis is present. This is the dominant vessel. There is no stenosis at the origin of the right vertebral artery from the subclavian artery. No other stenoses are present within the vertebral arteries.  Skeleton: Multilevel degenerative changes of the cervical spine are most evident at C5-6 and C6-7. There is fusion across the disc space at C5-6 and a vacuum disc at C6-7. Uncovertebral spurring an osseous foraminal narrowing is present at both levels.  Other neck: Soft tissues of the neck are otherwise within normal limits.  CTA HEAD  Anterior circulation: The internal carotid arteries demonstrate minimal atherosclerotic calcification within the cavernous segments. There are no significant stenoses. The A1 and M1 segments are normal. Both M1 segments are patent. No definite anterior communicating artery is present. Segmental attenuation is present within MCA branch vessels bilaterally. This is most prominent in the anterior left M2 branches. A posterior right MCA branch is somewhat more robust than the more anterior branches with suggestion of peel collaterals. This may represent luxury perfusion associated with an acute infarct.  Posterior circulation: Atherosclerotic irregularity and a 50% stenosis is present in the mid basilar artery. The PICA origins are intact. The vertebrobasilar junction is intact. There is a high-grade stenosis in the proximal left P1 segment. The right posterior cerebral artery is of fetal type. There is mild attenuation of distal PCA branch vessels.  Venous sinuses: The dural sinuses are patent.  Anatomic variants: A fetal type right posterior cerebral artery is present.   IMPRESSION: 1. Loss of gray-white differentiation within the right precentral gyrus suggesting acute/subacute nonhemorrhagic infarct within the primary motor cortex. 2.  Slight asymmetric hyperdensity of the right M1 segment on the noncontrast images without occlusion or stenosis on the CTA images. This could represent slow flow within the right M1 segment. 3. Slightly dilated distal posterior right MCA branch vessels and probable peel collaterals are also consistent with a right MCA infarct. 4. Remote left ACA territory infarct. 5. High-grade stenosis at the origin of the left vertebral artery, the dominant vessel. 6. High-grade left P1 segment stenosis. 7. 50% stenosis and atherosclerotic change within the mid basilar artery. These results were called by telephone at the time of interpretation on 04/14/2014 at 7:28 am to Dr. Noel Christmas , who verbally acknowledged these results.   Electronically Signed   By: Marin Roberts M.D.   On: 04/14/2014 08:00     EKG Interpretation None      MDM   Final diagnoses:  CVA (cerebral infarction)   81 year old male presenting as code stroke. Last seen normal was 0600 and was noted to have left-sided weakness and left facial droop soon after.  Patient CT with evidence of right-sided ischemic infarct. Lab work reassuring.  Neurologist, Dr. Roseanne Reno, at bedside and discussed options of treatment with son, will proceed with tPA which was given in ED.  Neurology will admit to ICU for further management.  Garlon Hatchet, PA-C 04/14/14 [address] PA-C 04/14/14 1032  Tomasita Crumble, MD 04/14/14 1322

## 2014-04-14 NOTE — Progress Notes (Signed)
Clinical Social Work Department BRIEF PSYCHOSOCIAL ASSESSMENT 04/14/2014  Patient:  Trevor Webb, Trevor Webb     Account Number:  [id number]     Admit date:  04/14/2014  Clinical Social Worker:  Corlis Hove, CLINICAL SOCIAL WORKER  Date/Time:  04/14/2014 10:00 AM  Referred by:  Physician  Date Referred:  04/14/2014 Referred for  Psychosocial assessment   Other Referral:   Patient from [facility].   Interview type:  Family Other interview type:    PSYCHOSOCIAL DATA Living Status:  FACILITY Admitted from facility:  [facility] Level of care:  Assisted Living Primary support name:  Trevor Webb Primary support relationship to patient:  CHILD, ADULT Degree of support available:   Patient's niece, Trevor Webb, currently bedside stated Trevor Webb, patient's son, is primary support.    CURRENT CONCERNS Current Concerns  Post-Acute Placement   Other Concerns:    SOCIAL WORK ASSESSMENT / PLAN CSW Intern attempted to speak with patient about CSW role and treatment plan. Patient was verbally unresponsive, but eyes were open. Patient's niece, Trevor Webb, stated he is a resident at [facility] and plans to return there upon discharge. Trevor Webb stated patient's son, Trevor Webb, is primary support and can be accessed by phone, if needed. CSW to follow up with patient and informed that CSW available for any questions. CSW remains available for support and to facilitate patient discharge need once medically ready. CSW Intern contacted ALF regarding patient being admitted.   Assessment/plan status:  Psychosocial Support/Ongoing Assessment of Needs Other assessment/ plan:   PT/OT  Update FL-2   Information/referral to community resources:    PATIENT'S/FAMILY'S RESPONSE TO PLAN OF CARE: Patient was unresponsive, though eyes were open. Patient's niece, Trevor Webb, currently present at bedside and stated patient's son, Trevor Webb, was present, but  left to run errands. Trevor Webb was engaged in CSW Intern assessment and agreeable with discharge plan. Trevor Webb understanding of Social Work role and appreciative of support.       Corlis Hove, CSW Intern

## 2014-04-14 NOTE — Clinical Social Work Psychosocial (Signed)
     Clinical Social Work Department BRIEF PSYCHOSOCIAL ASSESSMENT 04/14/2014  Patient:  Trevor Webb, Trevor Webb     Account Number:  [id number]     Admit date:  04/14/2014  Clinical Social Worker:  Corlis Hove, CLINICAL SOCIAL WORKER  Date/Time:  04/14/2014 10:00 AM  Referred by:  Physician  Date Referred:  04/14/2014 Referred for  Psychosocial assessment   Other Referral:   Patient from [facility].   Interview type:  Family Other interview type:    PSYCHOSOCIAL DATA Living Status:  FACILITY Admitted from facility:  [facility] Level of care:  Assisted Living Primary support name:  Neely Bester Primary support relationship to patient:  CHILD, ADULT Degree of support available:   Patient's niece, Trevor Webb, currently bedside stated Trevor Webb, patient's son, is primary support.    CURRENT CONCERNS Current Concerns  Post-Acute Placement   Other Concerns:    SOCIAL WORK ASSESSMENT / PLAN CSW Intern attempted to speak with patient about CSW role and treatment plan. Patient was verbally unresponsive, but eyes were open. Patient's niece, Trevor Webb, stated he is a resident at [facility] and plans to return there upon discharge. Ms. [name] stated patient's son, Trevor Webb, is primary support and can be accessed by phone, if needed. CSW to follow up with patient and informed that CSW available for any questions. CSW remains available for support and to facilitate patient discharge need once medically ready. CSW Intern contacted ALF regarding patient being admitted.   Assessment/plan status:  Psychosocial Support/Ongoing Assessment of Needs Other assessment/ plan:   PT/OT  Update FL-2   Information/referral to community resources:    PATIENTS/FAMILYS RESPONSE TO PLAN OF CARE: Patient was unresponsive, though eyes were open. Patient's niece, Trevor Webb, currently present at bedside and stated patient's son, Trevor Webb, was  present, but left to run errands. Ms. [name] was engaged in CSW Intern assessment and agreeable with discharge plan. Ms. [name] understanding of Social Work role and appreciative of support.

## 2014-04-15 ENCOUNTER — Inpatient Hospital Stay (HOSPITAL_COMMUNITY): Payer: Medicare PPO

## 2014-04-15 DIAGNOSIS — I6789 Other cerebrovascular disease: Secondary | ICD-10-CM

## 2014-04-15 DIAGNOSIS — I639 Cerebral infarction, unspecified: Secondary | ICD-10-CM

## 2014-04-15 LAB — LIPID PANEL
CHOLESTEROL: 152 mg/dL (ref 0–200)
HDL: 29 mg/dL — AB (ref >39)
LDL Cholesterol: 106 mg/dL — ABNORMAL HIGH (ref 0–99)
Total CHOL/HDL Ratio: 5.2 RATIO
Triglycerides: 87 mg/dL (ref <150)
VLDL: 17 mg/dL (ref 0–40)

## 2014-04-15 MED ORDER — CLOPIDOGREL BISULFATE 75 MG PO TABS
75.0000 mg | ORAL_TABLET | Freq: Every day | ORAL | Status: DC
Start: 2014-04-15 — End: 2014-04-16
  Administered 2014-04-15 – 2014-04-16 (×2): 75 mg via ORAL
  Filled 2014-04-15 (×2): qty 1

## 2014-04-15 MED ORDER — ATORVASTATIN CALCIUM 10 MG PO TABS
10.0000 mg | ORAL_TABLET | Freq: Every day | ORAL | Status: DC
  Administered 2014-04-16 – 2014-04-17 (×2): 10 mg via ORAL
  Filled 2014-04-15 (×2): qty 1

## 2014-04-15 MED ORDER — CETYLPYRIDINIUM CHLORIDE 0.05 % MT LIQD
7.0000 mL | Freq: Two times a day (BID) | OROMUCOSAL | Status: DC
  Administered 2014-04-15 – 2014-04-18 (×7): 7 mL via OROMUCOSAL

## 2014-04-15 NOTE — Progress Notes (Signed)
Patient recd on bed to unit. Patient oriented to surroundings and safety plan, will need to reinforce. Bed in low position, call bell in reach and bed alarm on.

## 2014-04-15 NOTE — Progress Notes (Signed)
  Echocardiogram 2D Echocardiogram has been performed.  Trevor Webb 04/15/2014, 9:59 AM

## 2014-04-15 NOTE — Evaluation (Signed)
Clinical/Bedside Swallow Evaluation Patient Details  Name: Trevor Webb MRN: [id number] Date of Birth: 29-Sep-1928  Today's Date: 04/15/2014 Time: SLP Start Time (ACUTE ONLY): 0845 SLP Stop Time (ACUTE ONLY): 0905 SLP Time Calculation (min) (ACUTE ONLY): 20 min  Past Medical History:  Past Medical History  Diagnosis Date  . Hypertension   . CHF, left ventricular   . Paroxysmal atrial fibrillation   . CHF (congestive heart failure)   . Angina   . Myocardial infarction 2009  . Stroke 2011    residual:  "right sided weakness; problems walking; dementia"  . Dementia     S/P CVA 2011  . Arthritis   . Dysphasia   . A-fib   . GERD (gastroesophageal reflux disease)   . Alzheimer's dementia   . Seizures     epilepsy  . Diverticulitis   . Abdominal abscess    Past Surgical History:  Past Surgical History  Procedure Laterality Date  . Coronary artery bypass graft  2009    CABG X4  . Knee arthroscopy      multiple; bilaterally  . Total knee arthroplasty      bilateral  . Total hip arthroplasty      right  . Joint replacement      bilateral knees; right hip  . Colon surgery    . Colon resection  1980's    "had colostomy bag for awhile; went back in and put it all back together"  . Colostomy takedown  1980's  . Esophagogastroduodenoscopy N/A 08/20/2012    Procedure: ESOPHAGOGASTRODUODENOSCOPY (EGD);  Surgeon: Vertell Novak., MD;  Location: [facility] ENDOSCOPY;  Service: Endoscopy;  Laterality: N/A;  . Colonoscopy N/A 08/20/2012    Procedure: COLONOSCOPY;  Surgeon: Vertell Novak., MD;  Location: [facility] ENDOSCOPY;  Service: Endoscopy;  Laterality: N/A;   HPI:  Pt is an 81 y.o. male with a history of previous stroke, atrial fibrillation (not on anticoagulation secondary to GI bleed per MD report), coronary artery disease, hypertension, hyperlipidemia, seizure disorder and dementia, brought to the emergency room and code stroke status following acute onset of left-sided weakness  and slurred speech. CT scan of his head showed findings suggestive of nonhemorrhagic infarction with gray-white differentiation loss within the right precentral gyrus. TPA which was administered. No clinical improvement was noted. NIH stroke score was 23.  Pt previously resided at [facility].     Assessment / Plan / Recommendation Clinical Impression  Pt presents with mild cognitively based oral dysphagia. No overt signs or symptoms of aspiration noted with thin, puree, or regular trialed consistencies. Pts PO intake of solids affected by cognitive impairments characterized by bolus holding, decreased functional mastication, decreased coordination and inability to self feed. Noted improvement with dysphagia 2 consistencies. Recommend thin liquids with dysphagia 2 diet, full supervision, and crushed meds. Straws allowed. Continued follow up indicated for potential diet upgrade if cognition improves. SLE will be initiated when scheduling allows.      Aspiration Risk  Moderate    Diet Recommendation Dysphagia 2 (Fine chop);Thin liquid   Liquid Administration via: Straw;Cup Medication Administration: Crushed with puree Supervision: Full supervision/cueing for compensatory strategies;Staff to assist with self feeding Compensations: Follow solids with liquid;Check for anterior loss;Small sips/bites;Slow rate Postural Changes and/or Swallow Maneuvers: Seated upright 90 degrees    Other  Recommendations Oral Care Recommendations: Oral care BID   Follow Up Recommendations       Frequency and Duration min 2x/week  2 weeks   Pertinent  Vitals/Pain No evidence     SLP Swallow Goals     Swallow Study Prior Functional Status       General Date of Onset: 04/14/14 HPI: Pt is an 81 y.o. male with a history of previous stroke, atrial fibrillation (not on anticoagulation secondary to GI bleed per MD report), coronary artery disease, hypertension, hyperlipidemia, seizure disorder and dementia,  brought to the emergency room and code stroke status following acute onset of left-sided weakness and slurred speech. CT scan of his head showed findings suggestive of nonhemorrhagic infarction with gray-white differentiation loss within the right precentral gyrus. TPA which was administered. No clinical improvement was noted. NIH stroke score was 23.  Pt previously resided at [facility].   Type of Study: Bedside swallow evaluation Diet Prior to this Study: NPO Temperature Spikes Noted: Yes Respiratory Status: Room air Behavior/Cognition: Confused;Alert;Pleasant mood Oral Cavity - Dentition: Adequate natural dentition Self-Feeding Abilities: Needs assist Patient Positioning: Upright in bed Baseline Vocal Quality: Low vocal intensity Volitional Cough: Weak Volitional Swallow: Unable to elicit    Oral/Motor/Sensory Function Overall Oral Motor/Sensory Function: Impaired Labial ROM: Reduced left Labial Symmetry: Abnormal symmetry left Labial Strength: Reduced Labial Sensation: Reduced Lingual ROM: Reduced left Lingual Symmetry: Abnormal symmetry left Lingual Strength: Reduced Facial Symmetry: Left droop   Ice Chips Ice chips: Impaired Presentation: Cup;Spoon Oral Phase Impairments: Reduced lingual movement/coordination Oral Phase Functional Implications: Left anterior spillage Other Comments: cognitive impairments affect self feeding capabilities    Thin Liquid Thin Liquid: Within functional limits Presentation: Straw;Cup;Spoon    Nectar Thick Nectar Thick Liquid: Not tested   Honey Thick Honey Thick Liquid: Not tested   Puree Puree: Within functional limits Presentation: Spoon   Solid   GO    Solid: Impaired Presentation: Spoon Oral Phase Impairments: Poor awareness of bolus Oral Phase Functional Implications: Oral holding Other Comments: cognitive deficits affecting safety with solid consistencies      Marcene Duos MA, CCC-SLP Acute Care Speech Language  Pathologist    Marcene Duos E 04/15/2014,9:37 AM

## 2014-04-15 NOTE — Progress Notes (Signed)
PT Cancellation Note  Patient Details Name: Trevor Webb MRN: [id number] DOB: [birth year]   Cancelled Treatment:    Reason Eval/Treat Not Completed: Other (comment) (pt on bedrest).  PT will check back pending pt being off of bed rest at future date.  Thanks,    Rollene Rotunda. Jermanie Minshall, PT, DPT [phone]   04/15/2014, 3:48 PM

## 2014-04-15 NOTE — Progress Notes (Addendum)
STROKE TEAM PROGRESS NOTE   HISTORY Trevor Webb is an 81 y.o. male with a history of previous stroke, atrial fibrillation not on anticoagulation, coronary artery disease, hypertension, hyperlipidemia, seizure disorder and dementia, brought to the emergency room and code stroke status following acute onset of left-sided weakness and slurred speech area he was last seen well at 6 AM this morning. He's not on anticoagulation because of GI. He's been taking aspirin daily. CT scan of his head showed findings suggestive of nonhemorrhagic infarction with gray-white differentiation loss within the right precentral gyrus. CT angiogram showed no large vessel occlusion or severe stenosis extracranially nor intracranial. Patient was deemed a candidate for intravenous thrombolytic therapy with TPA which was administered. No clinical improvement was noted. NIH stroke score was 23.  LSN: 6:00 AM on 04/14/2014 tPA Given: Yes MRankin: 4   SUBJECTIVE (INTERVAL HISTORY) No family is at bedside. Patient has baseline dementia, unable to provide any details   OBJECTIVE Temp:  [97.9 F (36.6 C)-99 F (37.2 C)] 98.3 F (36.8 C) (03/26 0400) Pulse Rate:  [36-112] 66 (03/26 0700) Cardiac Rhythm:  [-] Atrial fibrillation (03/26 0400) Resp:  [11-24] 17 (03/26 0700) BP: (129-194)/(50-143) 154/73 mmHg (03/26 0700) SpO2:  [95 %-100 %] 98 % (03/26 0700) Weight:  [77.2 kg (170 lb 3.1 oz)] 77.2 kg (170 lb 3.1 oz) (03/25 1315)  No results for input(s): GLUCAP in the last 168 hours.  Recent Labs Lab 04/14/14 0700 04/14/14 0702 04/14/14 0703  NA 143  --  144  K 3.8  --  3.8  CL 106  --  103  CO2 29  --   --   GLUCOSE 95  --  93  BUN 18  --  20  CREATININE 1.04 1.00 1.00  CALCIUM 8.8  --   --     Recent Labs Lab 04/14/14 0700  AST 17  ALT 13  ALKPHOS 126*  BILITOT 0.7  PROT 6.4  ALBUMIN 3.3*    Recent Labs Lab 04/14/14 0700 04/14/14 0703  WBC 8.9  --   NEUTROABS 6.2  --   HGB 13.4 14.3   HCT 40.3 42.0  MCV 90.6  --   PLT 249  --    No results for input(s): CKTOTAL, CKMB, CKMBINDEX, TROPONINI in the last 168 hours.  Recent Labs  04/14/14 0700  LABPROT 13.8  INR 1.05    Recent Labs  04/14/14 0730  COLORURINE YELLOW  LABSPEC 1.015  PHURINE 7.5  GLUCOSEU NEGATIVE  HGBUR SMALL*  BILIRUBINUR NEGATIVE  KETONESUR NEGATIVE  PROTEINUR 30*  UROBILINOGEN 2.0*  NITRITE NEGATIVE  LEUKOCYTESUR TRACE*       Component Value Date/Time   CHOL 152 04/15/2014 0216   TRIG 87 04/15/2014 0216   HDL 29* 04/15/2014 0216   CHOLHDL 5.2 04/15/2014 0216   VLDL 17 04/15/2014 0216   LDLCALC 106* 04/15/2014 0216   Lab Results  Component Value Date   HGBA1C 5.8* 03/25/2012      Component Value Date/Time   LABOPIA NONE DETECTED 04/14/2014 0730   COCAINSCRNUR NONE DETECTED 04/14/2014 0730   LABBENZ NONE DETECTED 04/14/2014 0730   AMPHETMU NONE DETECTED 04/14/2014 0730   THCU NONE DETECTED 04/14/2014 0730   LABBARB NONE DETECTED 04/14/2014 0730     Recent Labs Lab 04/14/14 0703  ETH <5    Ct Angio Head and Neck W/cm &/or Wo Cm 04/14/2014    1. Loss of gray-white differentiation within the right precentral gyrus suggesting acute/subacute nonhemorrhagic infarct within  the primary motor cortex.  2. Slight asymmetric hyperdensity of the right M1 segment on the noncontrast images without occlusion or stenosis on the CTA images. This could represent slow flow within the right M1 segment.  3. Slightly dilated distal posterior right MCA branch vessels and probable peel collaterals are also consistent with a right MCA infarct.  4. Remote left ACA territory infarct.  5. High-grade stenosis at the origin of the left vertebral artery, the dominant vessel.  6. High-grade left P1 segment stenosis.  7. 50% stenosis and atherosclerotic change within the mid basilar artery.     Ct Head Wo Contrast 04/15/2014    1. Evolving posterior right MCA territory ischemic infarct. No evidence  for hemorrhagic transformation or other complication status post tPA administration.  2. No new intracranial process.      PHYSICAL EXAM Physical exam: Exam: Gen: NAD Eyes: anicteric sclerae, moist conjunctivae                    CV: Irregular, no MRG, no carotid bruits, no peripheral edema Mental Status: Alert, poor historian, follow simple commands, baseline dementia. Oriented to name only.  Neuro: Detailed Neurologic Exam  Speech:    No aphasia, mild dysarthria  Cranial Nerves:    The pupils are equal, round, and reactive to light.. Attempted, Fundi not visualized.  No gaze preference. EOMI.  No blink on the left, left homonomous hemianopia.Marland Kitchenleft facial asymmetry. Tongue midline.   Motor Observation:    no involuntary movements noted. Tone appears normal.     Strength:    Difficult to perform full motor exam due to cognitive deficits but appears be moving all extremities, uppers anti-gravity bilaterally. purposeful movement with the left upper more than tight.   DTRs: brisk biceps, absent lowers     Sensation:  W/d to stim in all extremities, less in the right LE,   Plantars equivocal.      ASSESSMENT/PLAN [name] is a 81 y.o. male with history of previous stroke, atrial fibrillation not on anticoagulation, coronary artery disease, hypertension, hyperlipidemia, seizure disorder, and dementia presenting with acute onset of left hemiparesis and dysarthria.  He did receive IV t-PA - 66 mg on 04/14/2014 at 0715.  Stroke:  Non-dominant posterior right MCA territory ischemic infarct felt to be embolic secondary to atrial fibrillation.   MRI  pending  MRA    Carotid Doppler please refer to the CTA of the neck  2D Echo  pending  LDL 106  HgbA1c pending  SCDs for VTE prophylaxis  Diet NPO time specified no liquids  aspirin 81 mg orally every day prior to admission, now on plavix. Recommend Eliquis due to afib but will discuss with family due to  patient's dementia  Ongoing aggressive stroke risk factor management  Therapy recommendations:  Pending  Disposition:  Pending  Hypertension  Home meds: Coreg,  Stable   Hyperlipidemia  Home meds:  No lipid lowering medications prior to admission.  LDL 106, goal < 70  Add Lipitor when taking POs  Continue statin at discharge   Other Stroke Risk Factors  Advanced age  Hx stroke/TIA  Family hx stroke (mother)  Coronary artery disease   Other Active Problems    Other Pertinent History    Personally examined patient and images, and have documentes history, physical, neuro exam,assessment and plan as stated above.   Naomie Dean, MD Stroke Neurology [phone] [facility]   A total of 25 minutes was spent with  patient in evaluation of his care.      To contact Stroke Continuity provider, please refer to [email or web address]. After hours, contact General Neurology

## 2014-04-16 MED ORDER — LEVETIRACETAM 500 MG PO TABS
500.0000 mg | ORAL_TABLET | Freq: Two times a day (BID) | ORAL | Status: DC
  Administered 2014-04-16 – 2014-04-18 (×4): 500 mg via ORAL
  Filled 2014-04-16 (×4): qty 1

## 2014-04-16 MED ORDER — ISOSORBIDE MONONITRATE ER 30 MG PO TB24
15.0000 mg | ORAL_TABLET | Freq: Every day | ORAL | Status: DC
  Administered 2014-04-17 – 2014-04-18 (×2): 15 mg via ORAL
  Filled 2014-04-16 (×3): qty 1

## 2014-04-16 MED ORDER — APIXABAN 5 MG PO TABS
5.0000 mg | ORAL_TABLET | Freq: Two times a day (BID) | ORAL | Status: DC
  Administered 2014-04-17 – 2014-04-18 (×3): 5 mg via ORAL
  Filled 2014-04-16 (×3): qty 1

## 2014-04-16 MED ORDER — DONEPEZIL HCL 10 MG PO TABS
10.0000 mg | ORAL_TABLET | Freq: Every day | ORAL | Status: DC
  Administered 2014-04-16 – 2014-04-17 (×2): 10 mg via ORAL
  Filled 2014-04-16 (×2): qty 1

## 2014-04-16 MED ORDER — APIXABAN 2.5 MG PO TABS
2.5000 mg | ORAL_TABLET | Freq: Two times a day (BID) | ORAL | Status: DC
  Administered 2014-04-16 (×2): 2.5 mg via ORAL
  Filled 2014-04-16 (×2): qty 1

## 2014-04-16 MED ORDER — CARVEDILOL 6.25 MG PO TABS
6.2500 mg | ORAL_TABLET | Freq: Two times a day (BID) | ORAL | Status: DC
  Administered 2014-04-17 – 2014-04-18 (×3): 6.25 mg via ORAL
  Filled 2014-04-16 (×3): qty 1

## 2014-04-16 MED ORDER — CARVEDILOL 3.125 MG PO TABS
3.1250 mg | ORAL_TABLET | Freq: Two times a day (BID) | ORAL | Status: DC
  Administered 2014-04-16: 3.125 mg via ORAL
  Filled 2014-04-16: qty 1

## 2014-04-16 MED ORDER — FUROSEMIDE 40 MG PO TABS
40.0000 mg | ORAL_TABLET | Freq: Every day | ORAL | Status: DC
  Administered 2014-04-16 – 2014-04-18 (×3): 40 mg via ORAL
  Filled 2014-04-16 (×3): qty 1

## 2014-04-16 MED ORDER — SERTRALINE HCL 50 MG PO TABS
50.0000 mg | ORAL_TABLET | Freq: Every day | ORAL | Status: DC
  Administered 2014-04-16 – 2014-04-18 (×3): 50 mg via ORAL
  Filled 2014-04-16 (×3): qty 1

## 2014-04-16 MED ORDER — NITROGLYCERIN 0.4 MG SL SUBL: 0.4000 mg | SUBLINGUAL_TABLET | SUBLINGUAL | Status: DC | PRN

## 2014-04-16 MED ORDER — PANTOPRAZOLE SODIUM 40 MG PO TBEC
40.0000 mg | DELAYED_RELEASE_TABLET | Freq: Every day | ORAL | Status: DC
  Administered 2014-04-16 – 2014-04-18 (×3): 40 mg via ORAL
  Filled 2014-04-16 (×3): qty 1

## 2014-04-16 NOTE — Progress Notes (Signed)
STROKE TEAM PROGRESS NOTE   HISTORY Trevor Webb is an 81 y.o. male with a history of previous stroke, atrial fibrillation not on anticoagulation, coronary artery disease, hypertension, hyperlipidemia, seizure disorder and dementia, brought to the emergency room and code stroke status following acute onset of left-sided weakness and slurred speech area he was last seen well at 6 AM this morning. He's not on anticoagulation because of GI. He's been taking aspirin daily. CT scan of his head showed findings suggestive of nonhemorrhagic infarction with gray-white differentiation loss within the right precentral gyrus. CT angiogram showed no large vessel occlusion or severe stenosis extracranially nor intracranial. Patient was deemed a candidate for intravenous thrombolytic therapy with TPA which was administered. No clinical improvement was noted. NIH stroke score was 23.  LSN: 6:00 AM on 04/14/2014 tPA Given: Yes MRankin: 4   SUBJECTIVE (INTERVAL HISTORY) No family is at bedside. Patient has baseline dementia, unable to provide any details   OBJECTIVE Temp:  [98.1 F (36.7 C)-99.2 F (37.3 C)] 98.2 F (36.8 C) (03/27 0544) Pulse Rate:  [56-99] 75 (03/27 0544) Cardiac Rhythm:  [-] Atrial fibrillation (03/26 1600) Resp:  [18-31] 20 (03/27 0544) BP: (140-175)/(71-110) 172/75 mmHg (03/27 0544) SpO2:  [93 %-100 %] 94 % (03/27 0544)  No results for input(s): GLUCAP in the last 168 hours.  Recent Labs Lab 04/14/14 0700 04/14/14 0702 04/14/14 0703  NA 143  --  144  K 3.8  --  3.8  CL 106  --  103  CO2 29  --   --   GLUCOSE 95  --  93  BUN 18  --  20  CREATININE 1.04 1.00 1.00  CALCIUM 8.8  --   --     Recent Labs Lab 04/14/14 0700  AST 17  ALT 13  ALKPHOS 126*  BILITOT 0.7  PROT 6.4  ALBUMIN 3.3*    Recent Labs Lab 04/14/14 0700 04/14/14 0703  WBC 8.9  --   NEUTROABS 6.2  --   HGB 13.4 14.3  HCT 40.3 42.0  MCV 90.6  --   PLT 249  --    No results for  input(s): CKTOTAL, CKMB, CKMBINDEX, TROPONINI in the last 168 hours.  Recent Labs  04/14/14 0700  LABPROT 13.8  INR 1.05    Recent Labs  04/14/14 0730  COLORURINE YELLOW  LABSPEC 1.015  PHURINE 7.5  GLUCOSEU NEGATIVE  HGBUR SMALL*  BILIRUBINUR NEGATIVE  KETONESUR NEGATIVE  PROTEINUR 30*  UROBILINOGEN 2.0*  NITRITE NEGATIVE  LEUKOCYTESUR TRACE*       Component Value Date/Time   CHOL 152 04/15/2014 0216   TRIG 87 04/15/2014 0216   HDL 29* 04/15/2014 0216   CHOLHDL 5.2 04/15/2014 0216   VLDL 17 04/15/2014 0216   LDLCALC 106* 04/15/2014 0216   Lab Results  Component Value Date   HGBA1C 5.8* 03/25/2012      Component Value Date/Time   LABOPIA NONE DETECTED 04/14/2014 0730   COCAINSCRNUR NONE DETECTED 04/14/2014 0730   LABBENZ NONE DETECTED 04/14/2014 0730   AMPHETMU NONE DETECTED 04/14/2014 0730   THCU NONE DETECTED 04/14/2014 0730   LABBARB NONE DETECTED 04/14/2014 0730     Recent Labs Lab 04/14/14 0703  ETH <5    2-D echocardiogram 04/15/2014 Study Conclusions - Left ventricle: The cavity size was normal. Wall thickness was increased in a pattern of mild LVH. There was mild focal basal hypertrophy of the septum. Systolic function was moderately to severely reduced. The estimated ejection fraction was  in the range of 30% to 35%. Global hypokinesis with inferior, septal and apical wall motion abnormality suggestive of inferior infarct. The study is not technically sufficient to allow evaluation of LV diastolic function. - Aortic valve: Trileaflet. Sclerosis without stenosis. There was mild to moderate regurgitation. - Mitral valve: Either heavily calcified annulus or prior annuloplasty repair. No significant stenosis. There was moderate regurgitation. - Left atrium: Massively dilated at 70 ml/m2. - Right ventricle: The cavity size was mildly dilated. Systolic function was normal. - Right atrium: Severely dilated at 42 cm2. -  Tricuspid valve: There was moderate regurgitation. - Pulmonary arteries: PA peak pressure: 42 mm Hg (S). - Inferior vena cava: The vessel was normal in size. The respirophasic diameter changes were in the normal range (>= 50%), consistent with normal central venous pressure. Impressions: - Compared to the prior echo in 2012, the EF is improved, however, there is a clear inferior wall motion abnormality present. There is massive biatrial enlargement. Mild to moderate AI, moderate MR and moderate TR, with mild pulmonary hypertension (RVSP 42 mmHG)     Ct Angio Head and Neck W/cm &/or Wo Cm 04/14/2014    1. Loss of gray-white differentiation within the right precentral gyrus suggesting acute/subacute nonhemorrhagic infarct within the primary motor cortex.  2. Slight asymmetric hyperdensity of the right M1 segment on the noncontrast images without occlusion or stenosis on the CTA images. This could represent slow flow within the right M1 segment.  3. Slightly dilated distal posterior right MCA branch vessels and probable peel collaterals are also consistent with a right MCA infarct.  4. Remote left ACA territory infarct.  5. High-grade stenosis at the origin of the left vertebral artery, the dominant vessel.  6. High-grade left P1 segment stenosis.  7. 50% stenosis and atherosclerotic change within the mid basilar artery.     Ct Head Wo Contrast 04/15/2014    1. Evolving posterior right MCA territory ischemic infarct. No evidence for hemorrhagic transformation or other complication status post tPA administration.  2. No new intracranial process.       MRI brain without Contrast 04/15/2014 1. Restricted diffusion in the posterior right MCA territory with a predominantly gyriform pattern. This could reflect acute right MCA infarct or perhaps sequelae of seizure activity. 2. No significant mass effect. No evidence of associated hemorrhage at this time. 3. The examination had to  be discontinued prior to completion due to patient agitation.   PHYSICAL EXAM Physical exam: Exam: Gen: NAD Eyes: anicteric sclerae, moist conjunctivae                    CV: Irregular, no MRG, no carotid bruits, no peripheral edema Mental Status: Alert, poor historian, follows simple commands, baseline dementia. Oriented to name only.  Neuro: Detailed Neurologic Exam  Speech:    No aphasia, mild dysarthria  Cranial Nerves:    The pupils are equal, round, and reactive to light.. Attempted, Fundi not visualized.  No gaze preference. EOMI.  No blink on the left, left homonomous hemianopia.Marland Kitchenleft facial asymmetry. Tongue midline.   Motor Observation:    no involuntary movements noted. Increased tone right.     Strength:    Difficult to perform full motor exam due to cognitive deficits but appears be moving all extremities, uppers anti-gravity bilaterally. purposeful movement with the left upper more than tight.   DTRs: brisk biceps, absent lowers     Sensation:  W/d to stim in all extremities, less in the right LE,  Plantars equivocal.      ASSESSMENT/PLAN Mr. JASIEL BELISLE is a 81 y.o. male with history of previous stroke, atrial fibrillation not on anticoagulation, coronary artery disease, hypertension, hyperlipidemia, seizure disorder, and dementia presenting with acute onset of left hemiparesis and dysarthria.  He did receive IV t-PA - 66 mg on 04/14/2014 at 0715.  Stroke:  Non-dominant posterior right MCA territory ischemic infarct felt to be embolic secondary to atrial fibrillation.   MRI  as above  MRA  please refer to the CTA of the head  Carotid Doppler please refer to the CTA of the neck  2D Echo - EF 30-35%. EF improved compared to the 2012. Please see formal report above.  LDL 106  HgbA1c pending  SCDs for VTE prophylaxis  DIET DYS 2 Room service appropriate?: Yes; Fluid consistency:: Thin no liquids  aspirin 81 mg orally every day prior to  admission. Per notes, he has had multiple GI bleeds and family had declined  Family not at bedside, need discussion with family on eliquis. Called family and they are agreeable to eliquis. Will start eliquis today. Family will be at bedside in the morning and would like to speak with Dr. Roda Shutters.   Ongoing aggressive stroke risk factor management  Therapy recommendations:  Pending   Disposition:  Pending  Hypertension  Home meds: Coreg,  Stable   Hyperlipidemia  Home meds:  No lipid lowering medications prior to admission.  LDL 106, goal < 70  Lipitor added 10 mg daily.  Continue statin at discharge   Other Stroke Risk Factors  Advanced age  Hx stroke/TIA  Family hx stroke (mother)  Coronary artery disease   Other Active Problems    Other Pertinent History    Personally examined patient and images, and have documentes history, physical, neuro exam,assessment and plan as stated above.   Naomie Dean, MD Stroke Neurology [phone] [facility]   A total of 25 minutes was spent with patient and family in evaluation of his care.      To contact Stroke Continuity provider, please refer to [email or web address]. After hours, contact General Neurology

## 2014-04-16 NOTE — Evaluation (Signed)
Physical Therapy Evaluation Patient Details Name: Trevor Webb MRN: [id number] DOB: 1928-11-17 Today's Date: 04/16/2014   History of Present Illness  81 y.o. male with a history of previous stroke, atrial fibrillation not on anticoagulation, coronary artery disease, hypertension, hyperlipidemia, seizure disorder and dementia, brought to the emergency room and code stroke status following acute onset of left-sided weakness and slurred speech. CT revealed evolving posterior right MCA territory ischemic infarct  Clinical Impression  Pt adm due to above. No family present to determine baseline. Pt greatly limited in mobility secondary to deficits indicated below (see PT problem list). Pt to benefit from skilled acute PT to address deficits. Recommend lift for OOB activity at this time. Will require SNF and 24/7 (A) upon acute D/C.     Follow Up Recommendations SNF;Supervision/Assistance - 24 hour    Equipment Recommendations  None recommended by PT    Recommendations for Other Services OT consult     Precautions / Restrictions Precautions Precautions: Fall Restrictions Weight Bearing Restrictions: No      Mobility  Bed Mobility Overal bed mobility: Needs Assistance;+2 for physical assistance Bed Mobility: Sidelying to Sit;Rolling;Sit to Sidelying Rolling: Total assist;+2 for physical assistance Sidelying to sit: Total assist;+2 for physical assistance     Sit to sidelying: +2 for physical assistance;Total assist General bed mobility comments: pt very rigid and resisting bed mobility; attempted to incr WB on LEs but unable to place feet on ground due to decr hip flexion and tone; requires 2 person Total (A) with use of draw pad to partially bring to sitting position   Transfers                 General transfer comment: will require lift  Ambulation/Gait                Stairs            Wheelchair Mobility    Modified Rankin (Stroke Patients  Only) Modified Rankin (Stroke Patients Only) Pre-Morbid Rankin Score: Moderately severe disability (no family present) Modified Rankin: Severe disability     Balance Overall balance assessment: Needs assistance   Sitting balance-Leahy Scale: Zero Sitting balance - Comments: unable to fully sit EOB due to resistance and tone; requiring bracing and support at all times  Postural control: Posterior lean                                   Pertinent Vitals/Pain Pain Assessment: No/denies pain    Home Living Family/patient expects to be discharged to:: Skilled nursing facility                 Additional Comments: no family present; spoke with neuro MD who spoke with family; pt from ALF, family agreeable to SNF at same facility     Prior Function           Comments: no family present to determine PLOF; from ALF     Hand Dominance        Extremity/Trunk Assessment   Upper Extremity Assessment: Defer to OT evaluation           Lower Extremity Assessment: Difficult to assess due to impaired cognition;RLE deficits/detail;LLE deficits/detail RLE Deficits / Details: incr tone in Rt LE LLE Deficits / Details: involuntary movementsof Lt LE   Cervical / Trunk Assessment: Kyphotic  Communication   Communication: Expressive difficulties  Cognition Arousal/Alertness: Awake/alert Behavior During Therapy: Agitated;Restless Overall  Cognitive Status: Difficult to assess Area of Impairment: Problem solving;Following commands;Orientation;Attention;Safety/judgement Orientation Level: Disoriented to;Person;Place;Time;Situation Current Attention Level: Focused   Following Commands: Follows one step commands inconsistently     Problem Solving: Difficulty sequencing;Requires verbal cues;Requires tactile cues;Slow processing;Decreased initiation General Comments: perseverating on "i need help"; no family present; pt with hx of dementia    General Comments       Exercises Low Level/ICU Exercises Ankle Circles/Pumps: PROM;Both;5 reps;Limitations Ankle Circles/Pumps Limitations: ROm Hip ABduction/ADduction: PROM;Both;5 reps;Supine;Limitations Hip Abduction/Adduction Limitations: ROM  Heel Slides: AAROM;Both;5 reps;Limitations Heel Slides Limitations: decr ROM      Assessment/Plan    PT Assessment Patient needs continued PT services  PT Diagnosis Difficulty walking;Generalized weakness;Altered mental status   PT Problem List Decreased strength;Decreased activity tolerance;Decreased balance;Decreased mobility;Decreased range of motion;Decreased cognition;Decreased knowledge of use of DME;Decreased safety awareness;Impaired sensation;Impaired tone;Pain;Decreased skin integrity;Decreased knowledge of precautions  PT Treatment Interventions DME instruction;Gait training;Functional mobility training;Therapeutic activities;Therapeutic exercise;Balance training;Neuromuscular re-education;Patient/family education   PT Goals (Current goals can be found in the Care Plan section) Acute Rehab PT Goals Patient Stated Goal: unable to state PT Goal Formulation: Patient unable to participate in goal setting Time For Goal Achievement: 04/23/14 Potential to Achieve Goals: Fair    Frequency Min 3X/week   Barriers to discharge Decreased caregiver support from ALF will need 24/7 total (A)     Co-evaluation               End of Session   Activity Tolerance: Treatment limited secondary to agitation;Treatment limited secondary to medical complications (Comment) Patient left: in bed;with call bell/phone within reach;with bed alarm set;with SCD's reapplied;Other (comment) (heels floated and repositioned) Nurse Communication: Need for lift equipment;Precautions;Mobility status         Time: 8119-1478 PT Time Calculation (min) (ACUTE ONLY): 16 min   Charges:   PT Evaluation $Initial PT Evaluation Tier I: 1 Procedure     PT G CodesDonell Sievert, [place]  [phone] 04/16/2014, 3:21 PM

## 2014-04-17 DIAGNOSIS — E785 Hyperlipidemia, unspecified: Secondary | ICD-10-CM

## 2014-04-17 LAB — HEMOGLOBIN A1C
HEMOGLOBIN A1C: 5.7 % — AB (ref 4.8–5.6)
MEAN PLASMA GLUCOSE: 117 mg/dL

## 2014-04-17 NOTE — Progress Notes (Signed)
CARE MANAGEMENT NOTE 04/17/2014  Patient:  Trevor Webb, Trevor Webb   Account Number:  [id number]  Date Initiated:  04/17/2014  Documentation initiated by:  Lawerance Sabal  Subjective/Objective Assessment:   admitted with CVA, from [facility], dc to ALF with services vs. SNF,     Action/Plan:   SW and CM following for discharge placement   Anticipated DC Date:  04/18/2014   Anticipated DC Plan:  SKILLED NURSING FACILITY  In-house referral  Clinical Social Worker         Choice offered to / List presented to:             Status of service:   Medicare Important Message given?  YES (If response is "NO", the following Medicare IM given date fields will be blank) Date Medicare IM given:  04/17/2014 Medicare IM given by:  Lawerance Sabal Date Additional Medicare IM given:   Additional Medicare IM given by:    Discharge Disposition:  SKILLED NURSING FACILITY  Per UR Regulation:  Reviewed for med. necessity/level of care/duration of stay  If discussed at Long Length of Stay Meetings, dates discussed:    Comments:  10:20 talked with son carter reguarding dc disposition, son is questioning if ALF will take him back or if pt needs SNF, is requesting to speak with social worker, message sent to CSW, son at bedside, can be contacted at [phone]

## 2014-04-17 NOTE — Progress Notes (Signed)
STROKE TEAM PROGRESS NOTE   HISTORY Trevor Webb is an 81 y.o. male with a history of previous stroke, atrial fibrillation not on anticoagulation, coronary artery disease, hypertension, hyperlipidemia, seizure disorder and dementia, brought to the emergency room and code stroke status following acute onset of left-sided weakness and slurred speech area he was last seen well at 6 AM this morning 04/14/2014. He's not on anticoagulation because of GI bleed. He's been taking aspirin daily. CT scan of his head showed findings suggestive of nonhemorrhagic infarction with gray-white differentiation loss within the right precentral gyrus. CT angiogram showed no large vessel occlusion or severe stenosis extracranially nor intracranial. Patient was deemed a candidate for intravenous thrombolytic therapy with TPA which was administered. No clinical improvement was noted. NIH stroke score was 23. mRS 4.   SUBJECTIVE (INTERVAL HISTORY) Son Eulas Post at the bedside. Reports rectal bleeding on anticoagulation in the past due to diverticulitis which has been treated and fixed. See note below. Patient no acute issues overnight, eating well, will DC IV fluid. Social worker is working on placement.   OBJECTIVE Temp:  [97.5 F (36.4 C)-98.6 F (37 C)] 98.5 F (36.9 C) (03/28 0527) Pulse Rate:  [60-89] 60 (03/28 0527) Cardiac Rhythm:  [-]  Resp:  [18-21] 18 (03/28 0527) BP: (138-175)/(65-82) 145/73 mmHg (03/28 0527) SpO2:  [97 %-99 %] 98 % (03/28 0527)  No results for input(s): GLUCAP in the last 168 hours.  Recent Labs Lab 04/14/14 0700 04/14/14 0702 04/14/14 0703  NA 143  --  144  K 3.8  --  3.8  CL 106  --  103  CO2 29  --   --   GLUCOSE 95  --  93  BUN 18  --  20  CREATININE 1.04 1.00 1.00  CALCIUM 8.8  --   --     Recent Labs Lab 04/14/14 0700  AST 17  ALT 13  ALKPHOS 126*  BILITOT 0.7  PROT 6.4  ALBUMIN 3.3*    Recent Labs Lab 04/14/14 0700 04/14/14 0703  WBC 8.9  --    NEUTROABS 6.2  --   HGB 13.4 14.3  HCT 40.3 42.0  MCV 90.6  --   PLT 249  --    No results for input(s): CKTOTAL, CKMB, CKMBINDEX, TROPONINI in the last 168 hours. No results for input(s): LABPROT, INR in the last 72 hours. No results for input(s): COLORURINE, LABSPEC, [place], GLUCOSEU, HGBUR, BILIRUBINUR, KETONESUR, PROTEINUR, UROBILINOGEN, NITRITE, LEUKOCYTESUR in the last 72 hours.  Invalid input(s): APPERANCEUR     Component Value Date/Time   CHOL 152 04/15/2014 0216   TRIG 87 04/15/2014 0216   HDL 29* 04/15/2014 0216   CHOLHDL 5.2 04/15/2014 0216   VLDL 17 04/15/2014 0216   LDLCALC 106* 04/15/2014 0216   Lab Results  Component Value Date   HGBA1C 5.7* 04/15/2014      Component Value Date/Time   LABOPIA NONE DETECTED 04/14/2014 0730   COCAINSCRNUR NONE DETECTED 04/14/2014 0730   LABBENZ NONE DETECTED 04/14/2014 0730   AMPHETMU NONE DETECTED 04/14/2014 0730   THCU NONE DETECTED 04/14/2014 0730   LABBARB NONE DETECTED 04/14/2014 0730     Recent Labs Lab 04/14/14 0703  ETH <5   Ct Angio Head and Neck W/cm &/or Wo Cm 04/14/2014    1. Loss of gray-white differentiation within the right precentral gyrus suggesting acute/subacute nonhemorrhagic infarct within the primary motor cortex.  2. Slight asymmetric hyperdensity of the right M1 segment on the noncontrast images without  occlusion or stenosis on the CTA images. This could represent slow flow within the right M1 segment.  3. Slightly dilated distal posterior right MCA branch vessels and probable peel collaterals are also consistent with a right MCA infarct.  4. Remote left ACA territory infarct.  5. High-grade stenosis at the origin of the left vertebral artery, the dominant vessel.  6. High-grade left P1 segment stenosis.  7. 50% stenosis and atherosclerotic change within the mid basilar artery.   Ct Head Wo Contrast 04/15/2014    1. Evolving posterior right MCA territory ischemic infarct. No evidence for  hemorrhagic transformation or other complication status post tPA administration.  2. No new intracranial process.    MRI brain without Contrast 04/15/2014 1. Restricted diffusion in the posterior right MCA territory with a predominantly gyriform pattern. This could reflect acute right MCA infarct or perhaps sequelae of seizure activity. 2. No significant mass effect. No evidence of associated hemorrhage at this time. 3. The examination had to be discontinued prior to completion due to patient agitation.   2-D echocardiogram 04/15/2014 - Left ventricle: The cavity size was normal. Wall thickness wasincreased in a pattern of mild LVH. There was mild focal basalhypertrophy of the septum. Systolic function was moderately toseverely reduced. The estimated ejection fraction was in therange of 30% to 35%. Global hypokinesis with inferior, septal andapical wall motion abnormality suggestive of inferior infarct.The study is not technically sufficient to allow evaluation of LVdiastolic function. - Aortic valve: Trileaflet. Sclerosis without stenosis. There wasmild to moderate regurgitation. - Mitral valve: Either heavily calcified annulus or priorannuloplasty repair. No significant stenosis. There was moderateregurgitation. - Left atrium: Massively dilated at 70 ml/m2. - Right ventricle: The cavity size was mildly dilated. Systolicfunction was normal. - Right atrium: Severely dilated at 42 cm2. - Tricuspid valve: There was moderate regurgitation. - Pulmonary arteries: PA peak pressure: 42 mm Hg (S). - Inferior vena cava: The vessel was normal in size. Therespirophasic diameter changes were in the normal range (>= 50%),consistent with normal central venous pressure. Impressions: - Compared to the prior echo in 2012, the EF is improved, however,there is a clear inferior wall motion abnormality present. Thereis massive biatrial enlargement. Mild to moderate AI, moderate MRand moderate TR,  with mild pulmonary hypertension (RVSP 42 mmHG)   PHYSICAL EXAM Exam: Gen: NAD Eyes: anicteric sclerae, moist conjunctivae                    CV: Irregularly irregular, no MRG, no carotid bruits, no peripheral edema Mental Status: Alert, poor historian, follows simple commands, baseline dementia. Oriented to name only. Neuro: Detailed Neurologic Exam Mental status:    Orientated to self, but not orientated to time, place, situation. No aphasia, mild dysarthria Cranial Nerves:    The pupils are equal, round, and reactive to light. Attempted, Fundi not visualized.  No gaze preference. EOMI.  No blink to visual threat on the left. left facial asymmetry. Tongue midline.  Motor Observation:    no involuntary movements noted. Increased tone right.  Strength:    Difficult to perform full motor exam due to cognitive deficits but appears be moving all extremities, uppers 3/5 anti-gravity bilaterally. purposeful movement with the left upper more than tight. BLE proximal 0/5 and distal spontaneous trace withdrawal. DTRs: brisk biceps, absent lowers Sensation:  W/d to stim in all extremities, less in the right LE  Plantars equivocal.  Coordination test not cooperative and gait not tested.  ASSESSMENT/PLAN Mr. THALES KNIPPLE is a 81  y.o. male with history of previous stroke, atrial fibrillation not on anticoagulation, coronary artery disease, hypertension, hyperlipidemia, seizure disorder, and dementia presenting with acute onset of left hemiparesis and dysarthria.  He did receive IV t-PA - 66 mg on 04/14/2014 at 0715.  Stroke:  Non-dominant posterior right MCA territory ischemic infarct felt to be embolic secondary to atrial fibrillation. S/p tPA.  MRI  posterior right MCA territory ischemic infarct   CTA head  L VA High-grade stenosis at origin (dominant vessel), L P1 High-grade stenosis, 50% mid basilar artery stenosis  2D Echo - EF 30-35%. EF improved compared to the 2012.   LDL 106,  not at goal  HgbA1c 5.7, at goal  Eliquis for VTE prophylaxis  DIET DYS 2 Room service appropriate?: Yes; Fluid consistency:: Thin   On aspirin 81 mg orally every day prior to admission. Per notes, he has had multiple GI bleeds d/t diverticulitis in the past. Family had met with MDs and decided no anticoagulation at that time. Now, given new stroke d/t atrial fibrillation, Eliquis started this admission at a low dose per Dr. Jaynee Eagles. Dr. Erlinda Hong further discussed with son. Pt meets criteria for full dose, 5 mg bid. Have increased dose. Will watch for GIB, if resumes, will consider discontinuation at that time.  Ongoing aggressive stroke risk factor management  Therapy recommendations:  SNF. He is w/c bound PTA. Family can assist w/c transfer. Mentally clear in am, confused by pm.   Disposition:  Resident at [facility] P[facility]. ? Return to AL vs change to SNF. Son would like to discuss with SW. Loma Sousa will call Marjorie Smolder to help determine options. Medically ready for discharge from stroke standpoint once bed is found.  Afib  Chronic A. Fib not on anticoagulation PTA  History of GI bleeding due to diverticulitis while on chronic anticoagulation  Put on Eliquis this admission  Currently Eliquis 5 mg twice a day  Bleeding precautions  Hypertension  Home meds: Coreg, Lasix  Elevated this am 145/73   Coreg increased to 6.25 mg bid  Continue Lasix  Hyperlipidemia  Home meds:  No lipid lowering medications prior to admission.  LDL 106, goal < 70  Lipitor added 10 mg daily.  Continue statin at discharge  Other Stroke Risk Factors  Advanced age  Hx stroke/TIA - 04/27/2009 L brain stroke w/ R HP, dysphagia and dysarthria  Family hx stroke (mother)  Coronary artery disease  Other Active Problems  Baseline dementia on aricept  BIBY,SHARON  [place]ylie for Pager information 04/17/2014 10:12 AM   I, the attending vascular neurologist, have personally  obtained a history, examined the patient, evaluated laboratory data, individually viewed imaging studies and agree with radiology interpretations. I also obtained additional history from pt's son at bedside. Together with the NP/PA, we formulated the assessment and plan of care which reflects our mutual decision.  I have made any additions or clarifications directly to the above note and agree with the findings and plan as currently documented.   81 year old male with history of atrial fibrillation not on AC due to GI bleeding, stroke in 2011, dementia, HTN, HLD, seizure, CHF, CAD S/P CABG was admitted for right MCA territory stroke. EF 30-35%, LDL 106. Patient stroke likely due to atrial fibrillation not on [facility]. On discussion, family agreed to be on Eliquis which has lowers the GI bleeding profile amount NOACs. Currently on Eliquis 10m twice a day. Bleeding precautions. On Lipitor. Continue Keppra and Aricept. Waiting for  placement.  Rosalin Hawking, MD PhD Stroke Neurology 04/17/2014 6:18 PM       To contact Stroke Continuity provider, please refer to [email or web address] After hours, contact General Neurology

## 2014-04-17 NOTE — Progress Notes (Signed)
Speech Language Pathology Treatment: Dysphagia  Patient Details Name: Trevor Webb MRN: [id number] DOB: 08-05-1928 Today's Date: 04/17/2014 Time: 9507-2257 SLP Time Calculation (min) (ACUTE ONLY): 8 min  Assessment / Plan / Recommendation Clinical Impression  Skilled treatment session focused on addressing dysphagia goals.  SLP facilitated session with PO trials of pureed and regular textures and thin liquid via straw. SLP facilitated session with Max-Total assist to attend to boluses and manage oral pocketing with liquid washes.  Patient required Total assist to problem solve safely self-feeding due to cognitive deficits (see SLE for full details).  Patient continues to demonstrate poor awareness of bolus and perseveration which impact his overall safety with PO intake despite ability to mastication regular textures.  As a result, continue to recommend Dys.2 textures and thin liquids will Total staff assist to reduce aspiration risk.   HPI HPI: Pt is an 81 y.o. male with a history of previous stroke, atrial fibrillation (not on anticoagulation secondary to GI bleed per MD report), coronary artery disease, hypertension, hyperlipidemia, seizure disorder and dementia, brought to the emergency room and code stroke status following acute onset of left-sided weakness and slurred speech. CT scan of his head showed findings suggestive of nonhemorrhagic infarction with gray-white differentiation loss within the right precentral gyrus. TPA which was administered. No clinical improvement was noted. NIH stroke score was 23.  Pt previously resided at [facility].     Pertinent Vitals Pain Assessment: No/denies pain Faces Pain Scale: Hurts little more Pain Location: unable to state; grimacing noted when moving. Pain Descriptors / Indicators: Grimacing  SLP Plan  Continue with current plan of care    Recommendations Diet recommendations: Dysphagia 2 (fine chop);Thin liquid Liquids provided via:  Cup;Straw Medication Administration: Crushed with puree Supervision: Full supervision/cueing for compensatory strategies;Staff to assist with self feeding Compensations: Follow solids with liquid;Check for anterior loss;Small sips/bites;Slow rate Postural Changes and/or Swallow Maneuvers: Seated upright 90 degrees;Upright 30-60 min after meal              Oral Care Recommendations: Oral care BID Follow up Recommendations: Skilled Nursing facility;24 hour supervision/assistance Plan: Continue with current plan of care    GO    Trevor Webb., CCC-SLP [phone]  Trevor Webb 04/17/2014, 2:37 PM

## 2014-04-17 NOTE — Progress Notes (Signed)
Physical Therapy Treatment Patient Details Name: Trevor Webb MRN: [id number] DOB: 1928-02-01 Today's Date: 04/17/2014    History of Present Illness 81 y.o. male with a history of previous stroke, atrial fibrillation not on anticoagulation, coronary artery disease, hypertension, hyperlipidemia, seizure disorder and dementia, brought to the emergency room and code stroke status following acute onset of left-sided weakness and slurred speech. CT revealed evolving posterior right MCA territory ischemic infarct    PT Comments    Patient progressing very slowly with mobility. Continues to require total A of 2 for bed mobility with increased truncal rigidity limiting ability to obtain upright. Pt very lethargic throughout session with eyes closed. Not able to maintain sitting balance without external support. Pt appropriate to return to ALF if staff able to provide the necessary care and 24/7 with HHPT however if not able to provide 24/7, will need SNF.   Follow Up Recommendations  SNF;Supervision/Assistance - 24 hour     Equipment Recommendations  None recommended by PT    Recommendations for Other Services       Precautions / Restrictions Precautions Precautions: Fall Restrictions Weight Bearing Restrictions: No    Mobility  Bed Mobility Overal bed mobility: Needs Assistance Bed Mobility: Supine to Sit;Sit to Supine     Supine to sit: Total assist;+2 for physical assistance Sit to supine: Total assist;+2 for physical assistance   General bed mobility comments: Pt with rigidity.  Unable to flex hips greater than ~ 60* with max faciliation resulting in posterior lean.  Pt grimacing in pain when attempted.  Transfers                 General transfer comment: unable to perform safely at this time.   Ambulation/Gait                 Stairs            Wheelchair Mobility    Modified Rankin (Stroke Patients Only) Modified Rankin (Stroke Patients  Only) Pre-Morbid Rankin Score: Moderately severe disability Modified Rankin: Severe disability     Balance Overall balance assessment: Needs assistance Sitting-balance support: Feet supported Sitting balance-Leahy Scale: Zero Sitting balance - Comments: Pt unable to flex feet adequately to place feet on floor or extend RLE.  He was only able to achieve ~50-55* hip flexion with max facilitation due to truncal rigidity and support of 2 people.  Max A to maintain seated position. Posterior lean without external support.  Postural control: Posterior lean                          Cognition Arousal/Alertness: Lethargic Behavior During Therapy: Flat affect Overall Cognitive Status: No family/caregiver present to determine baseline cognitive functioning   Orientation Level: Disoriented to;Place;Time;Situation Current Attention Level: Focused   Following Commands: Follows one step commands inconsistently     Problem Solving: Decreased initiation;Slow processing;Difficulty sequencing;Requires verbal cues;Requires tactile cues General Comments: Increased time to follow 1 step commands with increased cues. Impaired initiation with all movement.     Exercises General Exercises - Lower Extremity Long Arc Quad: AAROM;Both;5 reps Hip Flexion/Marching: AAROM;5 reps;Both    General Comments        Pertinent Vitals/Pain Pain Assessment: Faces Faces Pain Scale: Hurts little more Pain Location: unable to state; grimacing noted when moving. Pain Descriptors / Indicators: Grimacing    Home Living Family/patient expects to be discharged to:: Skilled nursing facility  Additional Comments: no family present; spoke with neuro MD who spoke with family; pt from ALF, family agreeable to SNF at same facility     Prior Function Level of Independence: Needs assistance      Comments: Spoke with staff at [facility].  Pt transferred bed <> w/c <> toilet with +2  assistance.  He required total a for bathing, dressing, toileting, and grooming.  He was able to feed himself.  He would not initiate or engage in conversation, but was able to communicate basic needs with cuing.  He got up into his w/c every day all day, and was able to self propel w/c with his feet    PT Goals (current goals can now be found in the care plan section) Progress towards PT goals: Progressing toward goals    Frequency  Min 3X/week    PT Plan Current plan remains appropriate    Co-evaluation             End of Session   Activity Tolerance: Patient limited by fatigue;Patient limited by lethargy (Eyes remained closed for most of session.) Patient left: in bed;with call bell/phone within reach;with bed alarm set;with SCD's reapplied;Other (comment)     Time: 0947-0962 PT Time Calculation (min) (ACUTE ONLY): 17 min  Charges:  $Neuromuscular Re-education: 8-22 mins                    G CodesAlvie Heidelberg A 05/02/2014, 1:37 PM Alvie Heidelberg, PT, DPT [phone]

## 2014-04-17 NOTE — Clinical Social Work Note (Signed)
Patient's son chooses bed at [facility]. CSW remains available.   Derenda Fennel, MSW, LCSWA [phone] 04/17/2014 4:22 PM

## 2014-04-17 NOTE — Evaluation (Signed)
Speech Language Pathology Evaluation Patient Details Name: Trevor Webb MRN: [id number] DOB: May 10, 1928 Today's Date: 04/17/2014 Time: 4098-1191 SLP Time Calculation (min) (ACUTE ONLY): 9 min  Problem List:  Patient Active Problem List   Diagnosis Date Noted  . CVA (cerebral infarction) 04/14/2014  . GI bleed 08/18/2012  . Rectal bleed 03/25/2012  . Small bowel diverticular disease 07/16/2011  . Hypokalemia 07/08/2011  . Diverticulitis 07/03/2011  . Chronic anticoagulation 07/03/2011  . CKD (chronic kidney disease), stage II 07/03/2011  . Anemia 07/03/2011  . Hypertension   . CHF, left ventricular   . Paroxysmal atrial fibrillation   . Myocardial infarction   . Stroke   . Dementia   . Arthritis    Past Medical History:  Past Medical History  Diagnosis Date  . Hypertension   . CHF, left ventricular   . Paroxysmal atrial fibrillation   . CHF (congestive heart failure)   . Angina   . Myocardial infarction 2009  . Stroke 2011    residual:  "right sided weakness; problems walking; dementia"  . Dementia     S/P CVA 2011  . Arthritis   . Dysphasia   . A-fib   . GERD (gastroesophageal reflux disease)   . Alzheimer's dementia   . Seizures     epilepsy  . Diverticulitis   . Abdominal abscess    Past Surgical History:  Past Surgical History  Procedure Laterality Date  . Coronary artery bypass graft  2009    CABG X4  . Knee arthroscopy      multiple; bilaterally  . Total knee arthroplasty      bilateral  . Total hip arthroplasty      right  . Joint replacement      bilateral knees; right hip  . Colon surgery    . Colon resection  1980's    "had colostomy bag for awhile; went back in and put it all back together"  . Colostomy takedown  1980's  . Esophagogastroduodenoscopy N/A 08/20/2012    Procedure: ESOPHAGOGASTRODUODENOSCOPY (EGD);  Surgeon: Vertell Novak., MD;  Location: [facility] ENDOSCOPY;  Service: Endoscopy;  Laterality: N/A;  . Colonoscopy N/A 08/20/2012     Procedure: COLONOSCOPY;  Surgeon: Vertell Novak., MD;  Location: [facility] ENDOSCOPY;  Service: Endoscopy;  Laterality: N/A;   HPI:  Pt is an 81 y.o. male with a history of previous stroke, atrial fibrillation (not on anticoagulation secondary to GI bleed per MD report), coronary artery disease, hypertension, hyperlipidemia, seizure disorder and dementia, brought to the emergency room and code stroke status following acute onset of left-sided weakness and slurred speech. CT scan of his head showed findings suggestive of nonhemorrhagic infarction with gray-white differentiation loss within the right precentral gyrus. TPA which was administered. No clinical improvement was noted. NIH stroke score was 23.  Pt previously resided at [facility].     Assessment / Plan / Recommendation Clinical Impression  Orders received; cognitive-linguistic evaluation completed.  Patient demonstrates severe cognitive deficits characterized by poor sustained attention, orientation, recall of information and intellectual awareness which impacts his ability to safely carryover basic self-care tasks such as self-feeding.  Patient required frequent cues to open eyes throughout evaluation and demonstrate timely cessation of tasks.  Above mentioned cognitive deficits are suspected to be impacting speech-language abilities at this time, impacting expression of basic wants and needs.  As a result, he requires full supervision and skilled SLP intervention at this time and likely will upon discharge.  Of  note, patient will likely require a higher level of care, 24/7, following discharge from the hospital.      SLP Assessment  Patient needs continued Speech Lanaguage Pathology Services    Follow Up Recommendations  Skilled Nursing facility;24 hour supervision/assistance    Frequency and Duration min 2x/week  1 week   Pertinent Vitals/Pain Pain Assessment: No/denies pain Faces Pain Scale: Hurts little more Pain Location:  unable to state; grimacing noted when moving. Pain Descriptors / Indicators: Grimacing   SLP Goals  Progression toward goals: Progressing toward goals Patient/Family Stated Goal: none stated Potential to Achieve Goals (ACUTE ONLY): Fair Potential Considerations (ACUTE ONLY): Ability to learn/carryover information;Severity of impairments;Cooperation/participation level;Previous level of function  SLP Evaluation Prior Functioning  Cognitive/Linguistic Baseline: Information not available Type of Home: Assisted living   Cognition  Overall Cognitive Status: No family/caregiver present to determine baseline cognitive functioning Arousal/Alertness: Lethargic Orientation Level: Oriented to person;Disoriented to place;Disoriented to time;Disoriented to situation Attention: Sustained Sustained Attention: Impaired Sustained Attention Impairment: Verbal basic;Functional basic Memory: Impaired Memory Impairment: Storage deficit;Retrieval deficit;Decreased recall of new information;Decreased long term memory;Decreased short term memory;Prospective memory Decreased Long Term Memory: Verbal basic;Functional basic Decreased Short Term Memory: Verbal basic;Functional basic Awareness: Impaired Awareness Impairment: Intellectual impairment Problem Solving: Impaired Problem Solving Impairment: Verbal basic;Functional basic Executive Function:  (all impaired due to lower level deficits ) Behaviors: Perseveration;Poor frustration tolerance Safety/Judgment: Impaired    Comprehension  Auditory Comprehension Overall Auditory Comprehension: Impaired Yes/No Questions: Impaired Basic Immediate Environment Questions: 0-24% accurate Commands: Impaired One Step Basic Commands: 0-24% accurate Conversation: Simple EffectiveTechniques: Other (Comment) (hand-over-hand assist) Visual Recognition/Discrimination Discrimination: Exceptions to [place] Reading Comprehension Reading Status: Not tested    Expression  Expression Primary Mode of Expression: Verbal Verbal Expression Overall Verbal Expression: Impaired (suspect impacted by cognition at this time) Written Expression Dominant Hand: Right Written Expression: Not tested   Oral / Motor Oral Motor/Sensory Function Overall Oral Motor/Sensory Function: Impaired Labial ROM: Reduced left Labial Symmetry: Abnormal symmetry left Labial Strength: Reduced Labial Sensation: Reduced Lingual ROM: Reduced left Lingual Symmetry: Abnormal symmetry left Lingual Strength: Reduced Facial Symmetry: Left droop Motor Speech Overall Motor Speech: Impaired Respiration: Within functional limits Phonation: Low vocal intensity Intelligibility: Intelligibility reduced Phrase: 50-74% accurate Sentence: 50-74% accurate Conversation: Not tested Motor Planning: Witnin functional limits Effective Techniques: Increased vocal intensity;Over-articulate   GO    Charlane Ferretti., CCC-SLP [phone]  Trevor Webb 04/17/2014, 2:59 PM

## 2014-04-17 NOTE — Evaluation (Signed)
Occupational Therapy Evaluation Patient Details Name: Trevor Webb MRN: [id number] DOB: October 23, 1928 Today's Date: 04/17/2014    History of Present Illness 81 y.o. male with a history of previous stroke, atrial fibrillation not on anticoagulation, coronary artery disease, hypertension, hyperlipidemia, seizure disorder and dementia, brought to the emergency room and code stroke status following acute onset of left-sided weakness and slurred speech. CT revealed evolving posterior right MCA territory ischemic infarct   Clinical Impression   Pt admitted with above. He demonstrates the below listed deficits and will benefit from continued OT to maximize safety and independence with BADLs.  Pt presents to OT with impaired cognition, apraxia, decreased initiation, and truncal rigidity.  He, currently requires total A for BADLs.  PTA, pt was able to feed himself, and transferred to his w/c with +2 assist, and was able to self propel w/c with his feet.   He will benefit from continued OT to allow him to return to his baseline.  Per chart, family would like pt to return to [facility].  If they are able to accommodate him at his current level of care, recommend return there with HHOT.  However, if they are unable to accommodate him at his current level of care, he will require SNF.      Follow Up Recommendations  SNF;Supervision/Assistance - 24 hour    Equipment Recommendations  None recommended by OT    Recommendations for Other Services       Precautions / Restrictions Precautions Precautions: Fall      Mobility Bed Mobility Overal bed mobility: Needs Assistance Bed Mobility: Supine to Sit;Sit to Supine     Supine to sit: Total assist Sit to supine: Total assist   General bed mobility comments: Pt with rigidity.  Unable to flex hips greater than ~ 50-60* with max faciliation resulting in posterior lean.  Pt grimacing in pain when attempt   Transfers                  General transfer comment: unable to perform safely at this time.     Balance Overall balance assessment: Needs assistance Sitting-balance support: Feet unsupported Sitting balance-Leahy Scale: Zero Sitting balance - Comments: Pt unable to flex feet adequately to place feet on floor.   He was only able to achieve ~45-50* hip flexion with max facilitation due to truncal rigidity.  Max A to maintain seated position                                     ADL Overall ADL's : Needs assistance/impaired Eating/Feeding: Total assistance Eating/Feeding Details (indicate cue type and reason): Pt is able to drink from cup with max A.  He was unable to motor plan how to reach for cup, but with guiding, able to open hand to hold cup and then bring it to his mouth  Grooming: Wash/dry hands;Wash/dry face;Oral care;Brushing hair;Total assistance;Bed level   Upper Body Bathing: Total assistance;Bed level   Lower Body Bathing: Total assistance;Bed level   Upper Body Dressing : Total assistance;Bed level   Lower Body Dressing: Total assistance;Bed level   Toilet Transfer: Total assistance Toilet Transfer Details (indicate cue type and reason): unable to perform  Toileting- Clothing Manipulation and Hygiene: Total assistance;Bed level       Functional mobility during ADLs: Total assistance General ADL Comments: Pt moved to EOB with total A.  He reached for therpist's hand  one time on command, but would not attempt further.  Pt initially with eyes open, but closed eyes soon after therapist's entrance and would not reopen them when asked. Pt with significant truncal rigidity.      Vision Additional Comments: Unable to assess due to pt closed his eyes and would not reopen them when asked.  Pt initially was unable to visually target therapist's hand when asked to reach for it    Perception     Praxis Praxis Praxis tested?: Deficits Deficits: Initiation;Ideomotor    Pertinent  Vitals/Pain Pain Assessment: Faces Faces Pain Scale: Hurts little more Pain Location: unable to determine.  Grimmaces when moving to EOB  Pain Descriptors / Indicators: Grimacing;Guarding     Hand Dominance     Extremity/Trunk Assessment Upper Extremity Assessment Upper Extremity Assessment: RUE deficits/detail;LUE deficits/detail RUE Deficits / Details: Pt with minimal spontaneous movement.  AAROM WFL LUE Deficits / Details: minimal spontaneous movement during eval.  AAROM WFL    Lower Extremity Assessment Lower Extremity Assessment: Defer to PT evaluation   Cervical / Trunk Assessment Cervical / Trunk Assessment: Kyphotic;Other exceptions Cervical / Trunk Exceptions: Pt with rigidity of trunk noted.  Unable to isolate upper trunk and lower trunk movements    Communication Communication Communication: Expressive difficulties (Pt with minimal verbalizations )   Cognition Arousal/Alertness: Lethargic Behavior During Therapy: Flat affect Overall Cognitive Status: No family/caregiver present to determine baseline cognitive functioning   Orientation Level: Disoriented to;Place;Time;Situation Current Attention Level: Focused   Following Commands: Follows one step commands inconsistently       General Comments: Pt follows one step commands with a delay.  Does not spontaneously attempt to engage with his environment    General Comments       Exercises       Shoulder Instructions      Home Living Family/patient expects to be discharged to:: Skilled nursing facility                                 Additional Comments: no family present; spoke with neuro MD who spoke with family; pt from ALF, family agreeable to SNF at same facility       Prior Functioning/Environment Level of Independence: Needs assistance        Comments: Spoke with staff at [facility].  Pt transferred bed <> w/c <> toilet with +2 assistance.  He required total a for bathing,  dressing, toileting, and grooming.  He was able to feed himself.  He would not initiate or engage in conversation, but was able to communicate basic needs with cuing.  He got up into his w/c every day all day, and was able to self propel w/c with his feet     OT Diagnosis: Generalized weakness;Cognitive deficits;Disturbance of vision;Apraxia   OT Problem List: Decreased strength;Decreased activity tolerance;Impaired balance (sitting and/or standing);Decreased range of motion;Impaired vision/perception;Decreased coordination;Decreased cognition;Decreased safety awareness;Decreased knowledge of use of DME or AE;Pain;Impaired tone   OT Treatment/Interventions: Self-care/ADL training;Therapeutic exercise;Neuromuscular education;DME and/or AE instruction;Therapeutic activities;Cognitive remediation/compensation;Balance training;Patient/family education    OT Goals(Current goals can be found in the care plan section) Acute Rehab OT Goals OT Goal Formulation: Patient unable to participate in goal setting Time For Goal Achievement: 04/30/15 Potential to Achieve Goals: Fair ADL Goals Pt Will Perform Eating: with mod assist;bed level;sitting Additional ADL Goal #1: Pt will sit EOB x 5 mins with mod A in prep for functional transfers.  OT Frequency: Min 2X/week   Barriers to D/C: Decreased caregiver support          Co-evaluation              End of Session Nurse Communication: Mobility status  Activity Tolerance: Patient limited by lethargy Patient left: in bed;with call bell/phone within reach;with bed alarm set   Time: 1119-1131 OT Time Calculation (min): 12 min Charges:  OT General Charges $OT Visit: 1 Procedure OT Evaluation $Initial OT Evaluation Tier I: 1 Procedure G-Codes:    Jeani Hawking M May 04, 2014, 12:14 PM

## 2014-04-17 NOTE — Clinical Social Work Note (Signed)
Clinical Social Worker contacted pt's son, [name] as requested and discussed discharge options. Pt's son reported he would like pt to return to [facility] once medically stable. CSW has faxed clinical information to [facility] for possible re-admission and has advised facility to contact CSW once they have determined pt's appropriateness for assisted living.   CSW has also submitted pt's clinicals to SNF's in [place] area and confirmed that pt is not a [employer] member. Pt has regular [employer] Medicare coverage. CSW has related this information to SNF's. Pt's son is agreeable to SNF placement if pt cannot return to [facility].   CSW will continue to follow pt and pt's family for continue support and to facilitate pt's discharge needs once medically stable.   FL-2 on chart for MD signature.  Derenda Fennel, MSW, LCSWA [phone] 04/17/2014 11:10 AM

## 2014-04-18 ENCOUNTER — Inpatient Hospital Stay (HOSPITAL_COMMUNITY): Payer: Medicare PPO

## 2014-04-18 DIAGNOSIS — E785 Hyperlipidemia, unspecified: Secondary | ICD-10-CM

## 2014-04-18 DIAGNOSIS — R569 Unspecified convulsions: Secondary | ICD-10-CM

## 2014-04-18 DIAGNOSIS — Z7901 Long term (current) use of anticoagulants: Secondary | ICD-10-CM

## 2014-04-18 LAB — BASIC METABOLIC PANEL
ANION GAP: 7 (ref 5–15)
BUN: 13 mg/dL (ref 6–23)
CHLORIDE: 106 mmol/L (ref 96–112)
CO2: 28 mmol/L (ref 19–32)
CREATININE: 1.02 mg/dL (ref 0.50–1.35)
Calcium: 8.3 mg/dL — ABNORMAL LOW (ref 8.4–10.5)
GFR calc Af Amer: 76 mL/min — ABNORMAL LOW (ref >90)
GFR, EST NON AFRICAN AMERICAN: 65 mL/min — AB (ref >90)
GLUCOSE: 86 mg/dL (ref 70–99)
POTASSIUM: 3 mmol/L — AB (ref 3.5–5.1)
Sodium: 141 mmol/L (ref 135–145)

## 2014-04-18 LAB — CBC
HEMATOCRIT: 34.5 % — AB (ref 39.0–52.0)
Hemoglobin: 11.6 g/dL — ABNORMAL LOW (ref 13.0–17.0)
MCH: 30 pg (ref 26.0–34.0)
MCHC: 33.6 g/dL (ref 30.0–36.0)
MCV: 89.1 fL (ref 78.0–100.0)
Platelets: 228 10*3/uL (ref 150–400)
RBC: 3.87 MIL/uL — ABNORMAL LOW (ref 4.22–5.81)
RDW: 13.5 % (ref 11.5–15.5)
WBC: 8.8 10*3/uL (ref 4.0–10.5)

## 2014-04-18 MED ORDER — CARVEDILOL 6.25 MG PO TABS: 6.2500 mg | ORAL_TABLET | Freq: Two times a day (BID) | ORAL | Status: AC

## 2014-04-18 MED ORDER — ATORVASTATIN CALCIUM 10 MG PO TABS: 10.0000 mg | ORAL_TABLET | Freq: Every day | ORAL | Status: AC

## 2014-04-18 MED ORDER — APIXABAN 5 MG PO TABS: 5.0000 mg | ORAL_TABLET | Freq: Two times a day (BID) | ORAL | Status: AC

## 2014-04-18 MED ORDER — ISOSORBIDE MONONITRATE ER 30 MG PO TB24: 15.0000 mg | ORAL_TABLET | Freq: Every day | ORAL | Status: AC

## 2014-04-18 NOTE — Clinical Social Work Note (Signed)
Clinical Social Worker facilitated patient discharge including contacting patient family and facility to confirm patient discharge plans.  Clinical information faxed to facility and family agreeable with plan.  CSW arranged ambulance transport via [facility] to [facility].  RN to call report prior to discharge.  DC packet prepared and on chart for transport.   Clinical Social Worker will sign off for now as social work intervention is no longer needed. Please consult [place] again if new need arises.  Derenda Fennel, MSW, LCSWA [phone] 04/18/2014 2:29 PM

## 2014-04-18 NOTE — Clinical Social Work Placement (Signed)
Clinical Social Work Department CLINICAL SOCIAL WORK PLACEMENT NOTE 04/18/2014  Patient:  Trevor Webb, Trevor Webb  Account Number:  [id number] Admit date:  04/14/2014  Clinical Social Worker:  Derenda Fennel, CLINICAL SOCIAL WORKER  Date/time:  04/18/2014 12:00 M  Clinical Social Work is seeking post-discharge placement for this patient at the following level of care:   SKILLED NURSING   (*CSW will update this form in Epic as items are completed)   04/17/2014  Patient/family provided with [facility] Department of Clinical Social Work's list of facilities offering this level of care within the geographic area requested by the patient (or if unable, by the patient's family).  04/17/2014  Patient/family informed of their freedom to choose among providers that offer the needed level of care, that participate in Medicare, Medicaid or managed care program needed by the patient, have an available bed and are willing to accept the patient.  04/17/2014  Patient/family informed of [facility]' ownership interest in [facility], as well as of the fact that they are under no obligation to receive care at this facility.  PASARR submitted to EDS on EXISTING PASARR number received on EXISTING   FL2 transmitted to all facilities in geographic area requested by pt/family on  04/17/2014 FL2 transmitted to all facilities within larger geographic area on   Patient informed that his/her managed care company has contracts with or will negotiate with  certain facilities, including the following:   YES     Patient/family informed of bed offers received:  04/17/2014 Patient chooses bed at [facility] Physician recommends and patient chooses bed at    Patient to be transferred to [facility] on  04/18/2014 Patient to be transferred to facility by [facility] Patient and family notified of transfer on 04/18/2014 Name of family member notified:  Pt's son,  Salvatore Reints  The following physician request were entered in Epic:   Additional Comments:   Derenda Fennel, MSW, LCSWA [phone] 04/18/2014 11:28 AM

## 2014-04-18 NOTE — Discharge Summary (Signed)
Stroke Discharge Summary  Patient ID: Trevor Webb   MRN: [id number]      DOB: Aug 03, 1928  Date of Admission: 04/14/2014 Date of Discharge: 04/18/2014  Attending Physician:  Rosalin Hawking, MD, Stroke MD  Consulting Physician(s):   Treatment Team:  Md Stroke, MD   Patient's PCP:  Willodean Rosenthal, MD  DISCHARGE DIAGNOSIS:  Principal Problem:   CVA (cerebral infarction) - posterior right MCA territory s/p IV tPA, embolic secondary to atrial fibrillation  Active Problems:   Hypertension   Paroxysmal atrial fibrillation   Dementia   Chronic anticoagulation   Seizures   Hyperlipidemia LDL goal <70  BMI: Body mass index is 21.84 kg/(m^2).  Past Medical History  Diagnosis Date  . Hypertension   . CHF, left ventricular   . Paroxysmal atrial fibrillation   . CHF (congestive heart failure)   . Angina   . Myocardial infarction 2009  . Stroke 2011    residual:  "right sided weakness; problems walking; dementia"  . Dementia     S/P CVA 2011  . Arthritis   . Dysphasia   . A-fib   . GERD (gastroesophageal reflux disease)   . Alzheimer's dementia   . Seizures     epilepsy  . Diverticulitis   . Abdominal abscess    Past Surgical History  Procedure Laterality Date  . Coronary artery bypass graft  2009    CABG X4  . Knee arthroscopy      multiple; bilaterally  . Total knee arthroplasty      bilateral  . Total hip arthroplasty      right  . Joint replacement      bilateral knees; right hip  . Colon surgery    . Colon resection  1980's    "had colostomy bag for awhile; went back in and put it all back together"  . Colostomy takedown  1980's  . Esophagogastroduodenoscopy N/A 08/20/2012    Procedure: ESOPHAGOGASTRODUODENOSCOPY (EGD);  Surgeon: Winfield Cunas., MD;  Location: [facility] ENDOSCOPY;  Service: Endoscopy;  Laterality: N/A;  . Colonoscopy N/A 08/20/2012    Procedure: COLONOSCOPY;  Surgeon: Winfield Cunas., MD;  Location: [facility] ENDOSCOPY;  Service: Endoscopy;   Laterality: N/A;      Medication List    STOP taking these medications        aspirin EC 81 MG tablet     cephALEXin 500 MG capsule  Commonly known as:  KEFLEX     pantoprazole 40 MG tablet  Commonly known as:  PROTONIX     tamsulosin 0.4 MG Caps capsule  Commonly known as:  FLOMAX     traMADol 50 MG tablet  Commonly known as:  ULTRAM     valACYclovir 1000 MG tablet  Commonly known as:  VALTREX      TAKE these medications        acetaminophen 325 MG tablet  Commonly known as:  TYLENOL  Take 2 tablets (650 mg total) by mouth every 6 (six) hours as needed for pain.     albuterol 108 (90 BASE) MCG/ACT inhaler  Commonly known as:  PROVENTIL HFA;VENTOLIN HFA  Inhale 2 puffs into the lungs 3 (three) times daily as needed for shortness of breath.     apixaban 5 MG Tabs tablet  Commonly known as:  ELIQUIS  Take 1 tablet (5 mg total) by mouth 2 (two) times daily.     atorvastatin 10 MG tablet  Commonly known as:  LIPITOR  Take 1 tablet (10 mg total) by mouth daily at 6 PM.     azelastine 0.1 % nasal spray  Commonly known as:  ASTELIN  Place 2 sprays into the nose 2 (two) times daily as needed for allergies.     carvedilol 6.25 MG tablet  Commonly known as:  COREG  Take 1 tablet (6.25 mg total) by mouth 2 (two) times daily with a meal.     cyanocobalamin 1000 MCG/ML injection  Commonly known as:  (VITAMIN B-12)  Inject 1,000 mcg into the muscle every 30 (thirty) days. Give on the 24th of each month     docusate sodium 100 MG capsule  Commonly known as:  COLACE  Take 100 mg by mouth daily.     donepezil 10 MG tablet  Commonly known as:  ARICEPT  Take 10 mg by mouth at bedtime.     ferrous sulfate 325 (65 FE) MG tablet  Take 325 mg by mouth 2 (two) times daily.     furosemide 40 MG tablet  Commonly known as:  LASIX  Take 40 mg by mouth daily.     isosorbide mononitrate 30 MG 24 hr tablet  Commonly known as:  IMDUR  Take 0.5 tablets (15 mg total) by mouth  daily.     levETIRAcetam 500 MG tablet  Commonly known as:  KEPPRA  Take 500 mg by mouth 2 (two) times daily.     loratadine 10 MG tablet  Commonly known as:  CLARITIN  Take 10 mg by mouth daily.     nitroGLYCERIN 0.4 MG SL tablet  Commonly known as:  NITROSTAT  Place 0.4 mg under the tongue every 5 (five) minutes as needed for chest pain.     polyethylene glycol packet  Commonly known as:  MIRALAX / GLYCOLAX  Take 17 g by mouth daily.     sertraline 50 MG tablet  Commonly known as:  ZOLOFT  Take 50 mg by mouth daily.        LABORATORY STUDIES CBC    Component Value Date/Time   WBC 8.8 04/18/2014 0605   RBC 3.87* 04/18/2014 0605   HGB 11.6* 04/18/2014 0605   HCT 34.5* 04/18/2014 0605   PLT 228 04/18/2014 0605   MCV 89.1 04/18/2014 0605   MCH 30.0 04/18/2014 0605   MCHC 33.6 04/18/2014 0605   RDW 13.5 04/18/2014 0605   LYMPHSABS 1.6 04/14/2014 0700   MONOABS 0.9 04/14/2014 0700   EOSABS 0.2 04/14/2014 0700   BASOSABS 0.0 04/14/2014 0700   CMP    Component Value Date/Time   NA 141 04/18/2014 0605   K 3.0* 04/18/2014 0605   CL 106 04/18/2014 0605   CO2 28 04/18/2014 0605   GLUCOSE 86 04/18/2014 0605   BUN 13 04/18/2014 0605   CREATININE 1.02 04/18/2014 0605   CALCIUM 8.3* 04/18/2014 0605   PROT 6.4 04/14/2014 0700   ALBUMIN 3.3* 04/14/2014 0700   AST 17 04/14/2014 0700   ALT 13 04/14/2014 0700   ALKPHOS 126* 04/14/2014 0700   BILITOT 0.7 04/14/2014 0700   GFRNONAA 65* 04/18/2014 0605   GFRAA 76* 04/18/2014 0605   COAGS Lab Results  Component Value Date   INR 1.05 04/14/2014   INR 1.34 08/20/2012   INR 3.07* 08/19/2012   Lipid Panel    Component Value Date/Time   CHOL 152 04/15/2014 0216   TRIG 87 04/15/2014 0216   HDL 29* 04/15/2014 0216   CHOLHDL 5.2 04/15/2014 0216   VLDL 17 04/15/2014   0216   LDLCALC 106* 04/15/2014 0216   HgbA1C  Lab Results  Component Value Date   HGBA1C 5.7* 04/15/2014   Cardiac Panel (last 3 results) No results  for input(s): CKTOTAL, CKMB, TROPONINI, RELINDX in the last 72 hours. Urinalysis    Component Value Date/Time   COLORURINE YELLOW 04/14/2014 0730   APPEARANCEUR CLEAR 04/14/2014 0730   LABSPEC 1.015 04/14/2014 0730   PHURINE 7.5 04/14/2014 0730   GLUCOSEU NEGATIVE 04/14/2014 0730   HGBUR SMALL* 04/14/2014 0730   BILIRUBINUR NEGATIVE 04/14/2014 0730   KETONESUR NEGATIVE 04/14/2014 0730   PROTEINUR 30* 04/14/2014 0730   UROBILINOGEN 2.0* 04/14/2014 0730   NITRITE NEGATIVE 04/14/2014 0730   LEUKOCYTESUR TRACE* 04/14/2014 0730   Urine Drug Screen     Component Value Date/Time   LABOPIA NONE DETECTED 04/14/2014 0730   COCAINSCRNUR NONE DETECTED 04/14/2014 0730   LABBENZ NONE DETECTED 04/14/2014 0730   AMPHETMU NONE DETECTED 04/14/2014 0730   THCU NONE DETECTED 04/14/2014 0730   LABBARB NONE DETECTED 04/14/2014 0730    Alcohol Level    Component Value Date/Time   ETH <5 04/14/2014 0703     SIGNIFICANT DIAGNOSTIC STUDIES  Ct Angio Head and Neck W/cm &/or Wo Cm 04/14/2014  1. Loss of gray-white differentiation within the right precentral gyrus suggesting acute/subacute nonhemorrhagic infarct within the primary motor cortex.  2. Slight asymmetric hyperdensity of the right M1 segment on the noncontrast images without occlusion or stenosis on the CTA images. This could represent slow flow within the right M1 segment.  3. Slightly dilated distal posterior right MCA branch vessels and probable peel collaterals are also consistent with a right MCA infarct.  4. Remote left ACA territory infarct.  5. High-grade stenosis at the origin of the left vertebral artery, the dominant vessel.  6. High-grade left P1 segment stenosis.  7. 50% stenosis and atherosclerotic change within the mid basilar artery.   Ct Head Wo Contrast 04/15/2014  1. Evolving posterior right MCA territory ischemic infarct. No evidence for hemorrhagic transformation or other complication status post tPA  administration.  2. No new intracranial process.   MRI brain without Contrast 04/15/2014 1. Restricted diffusion in the posterior right MCA territory with a predominantly gyriform pattern. This could reflect acute right MCA infarct or perhaps sequelae of seizure activity. 2. No significant mass effect. No evidence of associated hemorrhage at this time. 3. The examination had to be discontinued prior to completion due to patient agitation.  2-D echocardiogram 04/15/2014 - Left ventricle: The cavity size was normal. Wall thickness wasincreased in a pattern of mild LVH. There was mild focal basalhypertrophy of the septum. Systolic function was moderately toseverely reduced. The estimated ejection fraction was in therange of 30% to 35%. Global hypokinesis with inferior, septal andapical wall motion abnormality suggestive of inferior infarct.The study is not technically sufficient to allow evaluation of LVdiastolic function. - Aortic valve: Trileaflet. Sclerosis without stenosis. There wasmild to moderate regurgitation. - Mitral valve: Either heavily calcified annulus or priorannuloplasty repair. No significant stenosis. There was moderateregurgitation. - Left atrium: Massively dilated at 70 ml/m2. - Right ventricle: The cavity size was mildly dilated. Systolicfunction was normal. - Right atrium: Severely dilated at 42 cm2. - Tricuspid valve: There was moderate regurgitation. - Pulmonary arteries: PA peak pressure: 42 mm Hg (S). - Inferior vena cava: The vessel was normal in size. Therespirophasic diameter changes were in the normal range (>= 50%),consistent with normal central venous pressure. Impressions: - Compared to the prior echo in 2012, the  EF is improved, however,there is a clear inferior wall motion abnormality present. Thereis massive biatrial enlargement. Mild to moderate AI, moderate MRand moderate TR, with mild pulmonary hypertension (RVSP 42 mmHG)     HISTORY OF  PRESENT ILLNESS Trevor Webb is an 81 y.o. male with a history of previous stroke, atrial fibrillation not on anticoagulation, coronary artery disease, hypertension, hyperlipidemia, seizure disorder and dementia, brought to the emergency room and code stroke status following acute onset of left-sided weakness and slurred speech area he was last seen well at 6 AM this morning 04/14/2014. He's not on anticoagulation because of GI bleed. He's been taking aspirin daily. CT scan of his head showed findings suggestive of nonhemorrhagic infarction with gray-white differentiation loss within the right precentral gyrus. CT angiogram showed no large vessel occlusion or severe stenosis extracranially nor intracranial. Patient was deemed a candidate for intravenous thrombolytic therapy with TPA which was administered 04/14/2014 at [place]. No clinical improvement was noted. CT angio showed no lesion amenable to intervention. NIH stroke score was 23. mRS 4. He was admitted for further stroke evaluation and care.  HOSPITAL COURSE  Stroke: Non-dominant posterior right MCA territory ischemic infarct s/p IV tPA. Infarct felt to be embolic secondary to atrial fibrillation not on anticoagulation.   Patient tolerated tPA without complication. Imaging at 24 hours shows no hemorrhage.   MRI posterior right MCA territory ischemic infarct   CTA head L VA High-grade stenosis at origin (dominant vessel), L P1 High-grade stenosis, 50% mid basilar artery stenosis  2D Echo - EF 30-35%. EF improved compared to 2012.   HgbA1c 5.7, at goal  Eliquis for VTE prophylaxis  He was on aspirin 81 mg orally every day prior to admission. Per notes, he has had multiple GI bleeds d/t diverticulitis in the past. Family had met with MDs and decided no anticoagulation at that time. Now, given new stroke d/t atrial fibrillation, family agreed to be on Eliquis which has a lower GI bleeding profile among NOACs. Initailly started on  Eliquis 2.5 mg, but as pt meets criteria for full dose, increased dose to Eliquis 75m twice a day. Placed on bleeding precautions. Will watch for GIB, if resumes, will consider discontinuation at that time.  Ongoing aggressive stroke risk factor management  Therapy recommendations: SNF. (baseline:  He is w/c bound PTA. Family can assist w/c transfer. Mentally clear in am, confused by pm.)  [facility] provide an increased level of care. [facility] Medically ready for discharge.  Afib  Chronic A. Fib not on anticoagulation PTA  History of GI bleeding due to diverticulitis while on chronic anticoagulation  Put on Eliquis this admission  Currently Eliquis 5 mg twice a day  Bleeding precautions in placed  Monitor for GIB, if recurs, stop anticoagulation   Hypertension  Home meds: Coreg, Lasix  Elevated during admittion  Coreg increased to 6.25 mg bid  Continue Lasix  Better controlled at discharge  Hyperlipidemia 1. Home meds: No lipid lowering medications prior to admission. 2. LDL 106, goal < 70 3. Lipitor added 10 mg daily. 4. Continue statin at discharge  Other Stroke Risk Factors  Advanced age  Hx stroke/TIA - 04/27/2009 L brain stroke w/ R HP, dysphagia and dysarthria  Family hx stroke (mother)  Coronary artery disease  Other Active Problems 1. Baseline dementia, continue aricept 2. Seizure, continue keppra   DISCHARGE EXAM Blood pressure 120/47, pulse 57, temperature  98 F (36.7 C), temperature source Oral, resp. rate 18, height 6' 2" (1.88 m), weight 77.2 kg (170 lb 3.1 oz), SpO2 99 %. Exam: Gen: NAD Eyes: anicteric sclerae, moist conjunctivae  CV: Irregularly irregular, no MRG, no carotid bruits, no peripheral edema Mental Status: Alert, poor historian, follows simple commands, baseline dementia. Oriented to name only. Neuro: Detailed  Neurologic Exam Mental status:  Orientated to self, but not orientated to time, place, situation. No aphasia, mild dysarthria Cranial Nerves:  The pupils are equal, round, and reactive to light. Attempted, Fundi not visualized. No gaze preference. EOMI. No blink to visual threat on the left. left facial asymmetry. Tongue midline.  Motor Observation:  no involuntary movements noted. Increased tone right.  Strength:  Difficult to perform full motor exam due to cognitive deficits but appears be moving all extremities, uppers 3/5 anti-gravity bilaterally. purposeful movement with the left upper more than tight. BLE proximal 0/5 and distal spontaneous trace withdrawal. DTRs: brisk biceps, absent lowers Sensation:  W/d to stim in all extremities, less in the right LE  Plantars equivocal.  Coordination test not cooperative and gait not tested.  Discharge Diet   Dysphagia 2 consistency, thin liquids  DISCHARGE PLAN  Disposition:  Discharge to skilled nursing facility for ongoing PT, OT and ST.    eliquis (apixaban) for secondary stroke prevention.  Follow-up POWELL, JERRY, MD in 2 weeks.  Follow-up with Dr.  , Stroke Clinic in 2 months.  40 minutes were spent preparing discharge.  NO FURTHER STROKE WORKUP INDICATED  Patient has a 10-15% risk of having another stroke over the next year, the highest risk is within 2 weeks of the most recent stroke/TIA (risk of having a stroke following a stroke or TIA is the same).  Ongoing risk factor control by Primary Care Physician  Stroke Service will sign off. Please call should any needs arise.  Follow-up Stroke Clinic at [facility] with Dr.   in 2 months, order placed.   I, the attending vascular neurologist, have personally obtained a history, examined the patient, evaluated laboratory data, individually viewed imaging studies and agree with radiology interpretations. Together with the NP/PA,  we formulated the assessment and plan of care which reflects our mutual decision.  I have made any additions or clarifications directly to the above note and agree with the findings and plan as currently documented.    , MD PhD Stroke Neurology 04/19/2014 12:12 AM      

## 2014-04-18 NOTE — Discharge Instructions (Signed)

## 2014-04-18 NOTE — Progress Notes (Signed)
Pt to be d/c to [facility], Report called to facility and spoke with Philis Nettle the nurse. Pt being cleaned and awaiting PT AR transportation son  Being aware of the discharge  And wants the nurse to call him when the transport arrive stroke education teaching given to son and pt son verbalized understanding.

## 2014-05-23 ENCOUNTER — Encounter (HOSPITAL_COMMUNITY): Payer: Self-pay | Admitting: Emergency Medicine

## 2014-05-23 ENCOUNTER — Emergency Department (HOSPITAL_COMMUNITY)
Admission: EM | Admit: 2014-05-23 | Discharge: 2014-05-23 | Disposition: A | Discharge status: Home / Self Care | Attending: Emergency Medicine | Admitting: Emergency Medicine

## 2014-05-23 ENCOUNTER — Emergency Department (HOSPITAL_COMMUNITY)

## 2014-05-23 DIAGNOSIS — Z7902 Long term (current) use of antithrombotics/antiplatelets: Secondary | ICD-10-CM | POA: Diagnosis not present

## 2014-05-23 DIAGNOSIS — I499 Cardiac arrhythmia, unspecified: Secondary | ICD-10-CM | POA: Insufficient documentation

## 2014-05-23 DIAGNOSIS — I252 Old myocardial infarction: Secondary | ICD-10-CM | POA: Insufficient documentation

## 2014-05-23 DIAGNOSIS — I509 Heart failure, unspecified: Secondary | ICD-10-CM | POA: Diagnosis not present

## 2014-05-23 DIAGNOSIS — Z79899 Other long term (current) drug therapy: Secondary | ICD-10-CM | POA: Insufficient documentation

## 2014-05-23 DIAGNOSIS — Z7982 Long term (current) use of aspirin: Secondary | ICD-10-CM | POA: Diagnosis not present

## 2014-05-23 DIAGNOSIS — M1990 Unspecified osteoarthritis, unspecified site: Secondary | ICD-10-CM | POA: Diagnosis not present

## 2014-05-23 DIAGNOSIS — Z96641 Presence of right artificial hip joint: Secondary | ICD-10-CM | POA: Insufficient documentation

## 2014-05-23 DIAGNOSIS — M25551 Pain in right hip: Secondary | ICD-10-CM | POA: Diagnosis not present

## 2014-05-23 DIAGNOSIS — F0280 Dementia in other diseases classified elsewhere without behavioral disturbance: Secondary | ICD-10-CM | POA: Diagnosis not present

## 2014-05-23 DIAGNOSIS — I10 Essential (primary) hypertension: Secondary | ICD-10-CM | POA: Insufficient documentation

## 2014-05-23 DIAGNOSIS — Z951 Presence of aortocoronary bypass graft: Secondary | ICD-10-CM | POA: Diagnosis not present

## 2014-05-23 DIAGNOSIS — I480 Paroxysmal atrial fibrillation: Secondary | ICD-10-CM | POA: Insufficient documentation

## 2014-05-23 DIAGNOSIS — Z8673 Personal history of transient ischemic attack (TIA), and cerebral infarction without residual deficits: Secondary | ICD-10-CM | POA: Insufficient documentation

## 2014-05-23 DIAGNOSIS — I209 Angina pectoris, unspecified: Secondary | ICD-10-CM | POA: Insufficient documentation

## 2014-05-23 DIAGNOSIS — G309 Alzheimer's disease, unspecified: Secondary | ICD-10-CM | POA: Insufficient documentation

## 2014-05-23 DIAGNOSIS — Z8719 Personal history of other diseases of the digestive system: Secondary | ICD-10-CM | POA: Insufficient documentation

## 2014-05-23 LAB — TYPE AND SCREEN
ABO/RH(D): O NEG
ANTIBODY SCREEN: POSITIVE
DAT, IGG: NEGATIVE

## 2014-05-23 LAB — CBC WITH DIFFERENTIAL/PLATELET
Basophils Absolute: 0 10*3/uL (ref 0.0–0.1)
Basophils Relative: 0 % (ref 0–1)
EOS ABS: 0.2 10*3/uL (ref 0.0–0.7)
EOS PCT: 3 % (ref 0–5)
HCT: 30.9 % — ABNORMAL LOW (ref 39.0–52.0)
Hemoglobin: 9.5 g/dL — ABNORMAL LOW (ref 13.0–17.0)
Lymphocytes Relative: 12 % (ref 12–46)
Lymphs Abs: 1.1 10*3/uL (ref 0.7–4.0)
MCH: 28.6 pg (ref 26.0–34.0)
MCHC: 30.7 g/dL (ref 30.0–36.0)
MCV: 93.1 fL (ref 78.0–100.0)
MONOS PCT: 11 % (ref 3–12)
Monocytes Absolute: 1 10*3/uL (ref 0.1–1.0)
NEUTROS ABS: 6.9 10*3/uL (ref 1.7–7.7)
Neutrophils Relative %: 74 % (ref 43–77)
Platelets: 287 10*3/uL (ref 150–400)
RBC: 3.32 MIL/uL — ABNORMAL LOW (ref 4.22–5.81)
RDW: 13.8 % (ref 11.5–15.5)
WBC: 9.3 10*3/uL (ref 4.0–10.5)

## 2014-05-23 LAB — PROTIME-INR
INR: 1.19 (ref 0.00–1.49)
Prothrombin Time: 15.3 seconds — ABNORMAL HIGH (ref 11.6–15.2)

## 2014-05-23 LAB — BASIC METABOLIC PANEL
ANION GAP: 9 (ref 5–15)
BUN: 25 mg/dL — AB (ref 6–20)
CALCIUM: 8.4 mg/dL — AB (ref 8.9–10.3)
CHLORIDE: 107 mmol/L (ref 101–111)
CO2: 29 mmol/L (ref 22–32)
CREATININE: 1.27 mg/dL — AB (ref 0.61–1.24)
GFR calc Af Amer: 58 mL/min — ABNORMAL LOW (ref >60)
GFR calc non Af Amer: 50 mL/min — ABNORMAL LOW (ref >60)
GLUCOSE: 105 mg/dL — AB (ref 70–99)
Potassium: 3.4 mmol/L — ABNORMAL LOW (ref 3.5–5.1)
Sodium: 145 mmol/L (ref 135–145)

## 2014-05-23 NOTE — ED Notes (Addendum)
Per EMS: Pt from [facility], pt has atraumatic right hip pain, 150 mcg of fentanyl given in route

## 2014-05-23 NOTE — ED Provider Notes (Signed)
CSN: [id number]     Arrival date & time 05/23/14  1357 History   First MD Initiated Contact with Patient 05/23/14 1411     Chief Complaint  Patient presents with  . Hip Pain   Level V caveat: Dementia.   Trevor Webb is 81 y.o. male with a history of CVA, advanced dementia, atrial fibrillation, CHF and right hip replacement who presents to the emergency department from [facility] complaining of right hip and leg pain since earlier today. During my evaluation the patient denies any pain or complaints. Per EMS the patient is complaining of right hip pain. Patient received 150 mg affect no prior to arrival by EMS. I called and spoke with Rosey Bath from [facility] who reports he started complaining of right hip and leg pain earlier today. They deny any falls or injury. They report he is at his baseline mental status. They have called spoken with his family. Patient has a DNR and MOST form. The patient is on Eliquis. When I speak to the patient he denies any complaints.  His son reports there is a bone mass on his right hip that they are currently not pursuing treatment for.   Trevor Webb is his daughter in law and phone number [phone].  Trevor Webb is son. Phone number: [phone]  (Consider location/radiation/quality/duration/timing/severity/associated sxs/prior Treatment) HPI  Past Medical History  Diagnosis Date  . Hypertension   . CHF, left ventricular   . Paroxysmal atrial fibrillation   . CHF (congestive heart failure)   . Angina   . Myocardial infarction 2009  . Stroke 2011    residual:  "right sided weakness; problems walking; dementia"  . Dementia     S/P CVA 2011  . Arthritis   . Dysphasia   . A-fib   . GERD (gastroesophageal reflux disease)   . Alzheimer's dementia   . Seizures     epilepsy  . Diverticulitis   . Abdominal abscess    Past Surgical History  Procedure Laterality Date  . Coronary artery bypass graft  2009    CABG X4  . Knee  arthroscopy      multiple; bilaterally  . Total knee arthroplasty      bilateral  . Total hip arthroplasty      right  . Joint replacement      bilateral knees; right hip  . Colon surgery    . Colon resection  1980's    "had colostomy bag for awhile; went back in and put it all back together"  . Colostomy takedown  1980's  . Esophagogastroduodenoscopy N/A 08/20/2012    Procedure: ESOPHAGOGASTRODUODENOSCOPY (EGD);  Surgeon: Vertell Novak., MD;  Location: [facility] ENDOSCOPY;  Service: Endoscopy;  Laterality: N/A;  . Colonoscopy N/A 08/20/2012    Procedure: COLONOSCOPY;  Surgeon: Vertell Novak., MD;  Location: [facility] ENDOSCOPY;  Service: Endoscopy;  Laterality: N/A;   Family History  Problem Relation Age of Onset  . Stroke Mother   . Heart attack Father    History  Substance Use Topics  . Smoking status: Never Smoker   . Smokeless tobacco: Never Used  . Alcohol Use: No    Review of Systems  Unable to perform ROS: Dementia      Allergies  Morphine and related  Home Medications   Prior to Admission medications   Medication Sig Start Date End Date Taking? Authorizing Provider  acetaminophen (TYLENOL) 325 MG tablet Take 2 tablets (650 mg total) by mouth every 6 (six)  hours as needed for pain. 08/23/12  Yes Shanker Levora Dredge, MD  albuterol (PROVENTIL HFA;VENTOLIN HFA) 108 (90 BASE) MCG/ACT inhaler Inhale 2 puffs into the lungs 3 (three) times daily as needed for shortness of breath.   Yes Historical Provider, MD  aspirin 81 MG tablet Take 81 mg by mouth daily with breakfast.   Yes Historical Provider, MD  azelastine (ASTELIN) 137 MCG/SPRAY nasal spray Place 2 sprays into the nose 2 (two) times daily as needed for allergies.    Yes Historical Provider, MD  docusate sodium (COLACE) 100 MG capsule Take 100 mg by mouth daily.   Yes Historical Provider, MD  donepezil (ARICEPT) 10 MG tablet Take 10 mg by mouth at bedtime.   Yes Historical Provider, MD  ferrous sulfate 325 (65 FE) MG  tablet Take 325 mg by mouth daily with breakfast.    Yes Historical Provider, MD  furosemide (LASIX) 40 MG tablet Take 40 mg by mouth daily.   Yes Historical Provider, MD  isosorbide mononitrate (IMDUR) 30 MG 24 hr tablet Take 0.5 tablets (15 mg total) by mouth daily. 04/18/14  Yes Layne Benton, NP  levETIRAcetam (KEPPRA) 500 MG tablet Take 500 mg by mouth 2 (two) times daily.   Yes Historical Provider, MD  nitroGLYCERIN (NITROSTAT) 0.4 MG SL tablet Place 0.4 mg under the tongue every 5 (five) minutes as needed for chest pain.    Yes Historical Provider, MD  polyethylene glycol (MIRALAX / GLYCOLAX) packet Take 17 g by mouth daily.   Yes Historical Provider, MD  sertraline (ZOLOFT) 50 MG tablet Take 50 mg by mouth daily.   Yes Historical Provider, MD  Skin Protectants, Misc. (BAZA PROTECT EX) Apply 1 application topically as needed (use for every incontinent episode).   Yes Historical Provider, MD  vitamin B-12 (CYANOCOBALAMIN) 1000 MCG tablet Take 1,000 mcg by mouth daily with breakfast.   Yes Historical Provider, MD  apixaban (ELIQUIS) 5 MG TABS tablet Take 1 tablet (5 mg total) by mouth 2 (two) times daily. Patient not taking: Reported on 05/23/2014 04/18/14   Layne Benton, NP  atorvastatin (LIPITOR) 10 MG tablet Take 1 tablet (10 mg total) by mouth daily at 6 PM. Patient not taking: Reported on 05/23/2014 04/18/14   Layne Benton, NP  carvedilol (COREG) 6.25 MG tablet Take 1 tablet (6.25 mg total) by mouth 2 (two) times daily with a meal. Patient not taking: Reported on 05/23/2014 04/18/14   Layne Benton, NP   BP 154/68 mmHg  Pulse 87  Temp(Src) 97.8 F (36.6 C) (Oral)  Resp 18  SpO2 97% Physical Exam  Constitutional: He appears well-developed and well-nourished. No distress.  Nontoxic appearing.  HENT:  Head: Normocephalic and atraumatic.  Right Ear: External ear normal.  Left Ear: External ear normal.  Mouth/Throat: Oropharynx is clear and moist. No oropharyngeal exudate.  Eyes:  Conjunctivae are normal. Pupils are equal, round, and reactive to light. Right eye exhibits no discharge. Left eye exhibits no discharge.  Neck: Neck supple. No JVD present.  Cardiovascular: Normal rate and intact distal pulses.   HR 64. Bilateral radial, posterior tibialis and dorsalis pedis pulses are intact. Irregularly irregular rhythm.   Pulmonary/Chest: Effort normal and breath sounds normal. No respiratory distress. He has no wheezes. He has no rales. He exhibits no tenderness.  Abdominal: Soft. Bowel sounds are normal. He exhibits no distension. There is no tenderness. There is no guarding.  Abdomen is soft and nontender to palpation.  Genitourinary: Penis normal.  No penile tenderness.  Musculoskeletal: He exhibits no edema.  Patient is laying in bed with his knee up. He follows some commands but will not move his right leg when directed. He has no pain with manipulation of his right leg or thigh. No lower extremity edema or tenderness. No pelvic instability. No shortening or rotation of his legs.   Lymphadenopathy:    He has no cervical adenopathy.  Neurological: He is alert.  The patient is alert and oriented to person only.  Skin: Skin is warm and dry. No rash noted. He is not diaphoretic. No erythema. No pallor.  Nursing note and vitals reviewed.   ED Course  Procedures (including critical care time) Labs Review Labs Reviewed  BASIC METABOLIC PANEL - Abnormal; Notable for the following:    Potassium 3.4 (*)    Glucose, Bld 105 (*)    BUN 25 (*)    Creatinine, Ser 1.27 (*)    Calcium 8.4 (*)    GFR calc non Af Amer 50 (*)    GFR calc Af Amer 58 (*)    All other components within normal limits  CBC WITH DIFFERENTIAL/PLATELET - Abnormal; Notable for the following:    RBC 3.32 (*)    Hemoglobin 9.5 (*)    HCT 30.9 (*)    All other components within normal limits  PROTIME-INR - Abnormal; Notable for the following:    Prothrombin Time 15.3 (*)    All other components  within normal limits  TYPE AND SCREEN    Imaging Review Dg Hip Unilat With Pelvis 2-3 Views Right  05/23/2014   CLINICAL DATA:  Altered level of consciousness. History of atraumatic RIGHT hip pain not otherwise specified. Patient cannot give history.  EXAM: RIGHT HIP (WITH PELVIS) 2-3 VIEWS  COMPARISON:  CT abdomen pelvis 07/07/2011.  FINDINGS: There is study chronic LEFT hip fracture which is been treated with compression screw and plate fixation. Extraosseous/ heterotopic ossification between the pelvis, femoral head and neck, as well as a greater trochanter bridge the fracture, likely immobilize the RIGHT hip joint. Joint space narrowing LEFT hip. No acute pelvic fracture. Fecal impaction.  IMPRESSION: No acute abnormalities. Chronic changes as described appear similar to prior CT 2013.   Electronically Signed   By: Davonna Belling M.D.   On: 05/23/2014 16:32     EKG Interpretation   Date/Time:  Tuesday May 23 2014 15:15:57 EDT Ventricular Rate:  62 PR Interval:    QRS Duration: 149 QT Interval:  623 QTC Calculation: 633 R Axis:   -59 Text Interpretation:  Atrial fibrillation Multiple ventricular premature  complexes Left bundle branch block Baseline wander in lead(s) II III aVF  V1 No significant change since last tracing Confirmed by POLLINA  MD,  CHRISTOPHER [phone]) on 05/23/2014 3:18:46 PM      Filed Vitals:   05/23/14 1401 05/23/14 1509  BP: 140/66 154/68  Pulse: 40 87  Temp:  97.8 F (36.6 C)  TempSrc:  Oral  Resp:  18  SpO2: 98% 97%     MDM   Meds given in ED:  Medications - No data to display  New Prescriptions   No medications on file    Final diagnoses:  Right hip pain   This is 81 y.o. male with a history of CVA, advanced dementia, atrial fibrillation, CHF and right hip replacement who presents to the emergency department from [facility] complaining of right hip and leg pain since earlier today. During my evaluation the patient  denies any pain or  complaints. Per EMS the patient is complaining of right hip pain. Patient received 150 mg affect no prior to arrival by EMS. I called and spoke with Rosey Bath from [facility] who reports he started complaining of right hip and leg pain earlier today. They deny any falls or injury. They report he is at his baseline mental status. He denies any pain. I am unable to elicit pain with manipulation of his right leg. Will obtain blood work and x-ray of his right hip. Blood work is unremarkable.  I called and spoke with his son [name] who reports that he has a mass on his right hip and this may be the cause of his pain. They report this has been present for several years and they are not pursuing treatment. The patient has a DNR. He reports hospice is involved.  X-ray of his right hip and pelvis show no acute abnormalities but chronic changes that were present in CT on 2013. Called and spoke with son again and will discharge home. The son is okay with just tylenol PRN and he said he would get hospice involved if further was needed. Will discharge to [facility].   This patient was discussed with Dr. Blinda Leatherwood who agrees with assessment and plan.    Everlene Farrier, PA-C 05/23/14 1650  Gilda Crease, MD 05/24/14 [phone]

## 2014-05-23 NOTE — Progress Notes (Signed)
CSW met with pt at bedside. There was no family present. Patient appears to be confused. He was not effectively communicative. Per note, pt has a hx of dementia. Also, per note pt presents to [facility] from [facility] c/o R hip pain and leg pain.  Patient was able to confirm that he presents from [facility] and states that he receives assistance with ADL's.  CSW consulted with Nurse Tech who states that the pt will be discharged soon.  Willette Brace [phone] ED CSW 05/23/2014 5:34 PM

## 2014-05-23 NOTE — Discharge Instructions (Signed)
Please continue his tylenol PRN order for his hip pain. X-ray today revealed no acute changes, but there are some chronic changes that re likely causing his pain.   Hip Pain Your hip is the joint between your upper legs and your lower pelvis. The bones, cartilage, tendons, and muscles of your hip joint perform a lot of work each day supporting your body weight and allowing you to move around. Hip pain can range from a minor ache to severe pain in one or both of your hips. Pain may be felt on the inside of the hip joint near the groin, or the outside near the buttocks and upper thigh. You may have swelling or stiffness as well.  HOME CARE INSTRUCTIONS   Take medicines only as directed by your health care provider.  Apply ice to the injured area:  Put ice in a plastic bag.  Place a towel between your skin and the bag.  Leave the ice on for 15-20 minutes at a time, 3-4 times a day.  Keep your leg raised (elevated) when possible to lessen swelling.  Avoid activities that cause pain.  Follow specific exercises as directed by your health care provider.  Sleep with a pillow between your legs on your most comfortable side.  Record how often you have hip pain, the location of the pain, and what it feels like. SEEK MEDICAL CARE IF:   You are unable to put weight on your leg.  Your hip is red or swollen or very tender to touch.  Your pain or swelling continues or worsens after 1 week.  You have increasing difficulty walking.  You have a fever. SEEK IMMEDIATE MEDICAL CARE IF:   You have fallen.  You have a sudden increase in pain and swelling in your hip. MAKE SURE YOU:   Understand these instructions.  Will watch your condition.  Will get help right away if you are not doing well or get worse. Document Released: 06/26/2009 Document Revised: 05/23/2013 Document Reviewed: 09/02/2012 [facility] Patient Information 2015 [place], [place]. This information is not intended to replace  advice given to you by your health care provider. Make sure you discuss any questions you have with your health care provider.

## 2014-05-23 NOTE — ED Notes (Signed)
Bed: WA23 Expected date:  Expected time:  Means of arrival:  Comments: EMS-fall 

## 2014-06-21 DEATH — deceased

## 2014-06-26 ENCOUNTER — Ambulatory Visit: Payer: Self-pay | Admitting: Neurology

## 2014-06-27 ENCOUNTER — Encounter: Payer: Self-pay | Admitting: Neurology

## 2014-07-11 ENCOUNTER — Ambulatory Visit: Payer: Self-pay | Admitting: Neurology
